# Patient Record
Sex: Female | Born: 1965 | Race: White | Hispanic: No | Marital: Married | Smoking: Never smoker
Health system: Southern US, Community
[De-identification: ages and names within clinical notes are randomized; demographics above are authoritative.]

## PROBLEM LIST (undated history)

## (undated) ENCOUNTER — Emergency Department (HOSPITAL_COMMUNITY): Admission: EM | Payer: BC Managed Care – PPO | Source: Home / Self Care

## (undated) DIAGNOSIS — D649 Anemia, unspecified: Secondary | ICD-10-CM

## (undated) DIAGNOSIS — IMO0002 Reserved for concepts with insufficient information to code with codable children: Secondary | ICD-10-CM

## (undated) DIAGNOSIS — F329 Major depressive disorder, single episode, unspecified: Secondary | ICD-10-CM

## (undated) DIAGNOSIS — W3400XA Accidental discharge from unspecified firearms or gun, initial encounter: Secondary | ICD-10-CM

## (undated) DIAGNOSIS — L97509 Non-pressure chronic ulcer of other part of unspecified foot with unspecified severity: Secondary | ICD-10-CM

## (undated) DIAGNOSIS — E669 Obesity, unspecified: Secondary | ICD-10-CM

## (undated) DIAGNOSIS — E11319 Type 2 diabetes mellitus with unspecified diabetic retinopathy without macular edema: Secondary | ICD-10-CM

## (undated) DIAGNOSIS — E11610 Type 2 diabetes mellitus with diabetic neuropathic arthropathy: Secondary | ICD-10-CM

## (undated) DIAGNOSIS — E1165 Type 2 diabetes mellitus with hyperglycemia: Secondary | ICD-10-CM

## (undated) DIAGNOSIS — E785 Hyperlipidemia, unspecified: Secondary | ICD-10-CM

## (undated) DIAGNOSIS — E1140 Type 2 diabetes mellitus with diabetic neuropathy, unspecified: Secondary | ICD-10-CM

## (undated) DIAGNOSIS — F32A Depression, unspecified: Secondary | ICD-10-CM

## (undated) DIAGNOSIS — H269 Unspecified cataract: Secondary | ICD-10-CM

## (undated) DIAGNOSIS — G8929 Other chronic pain: Secondary | ICD-10-CM

## (undated) DIAGNOSIS — M810 Age-related osteoporosis without current pathological fracture: Secondary | ICD-10-CM

## (undated) DIAGNOSIS — F419 Anxiety disorder, unspecified: Secondary | ICD-10-CM

## (undated) DIAGNOSIS — I10 Essential (primary) hypertension: Secondary | ICD-10-CM

## (undated) DIAGNOSIS — IMO0001 Reserved for inherently not codable concepts without codable children: Secondary | ICD-10-CM

## (undated) DIAGNOSIS — E118 Type 2 diabetes mellitus with unspecified complications: Secondary | ICD-10-CM

## (undated) DIAGNOSIS — J189 Pneumonia, unspecified organism: Secondary | ICD-10-CM

## (undated) HISTORY — DX: Type 2 diabetes mellitus with diabetic neuropathic arthropathy: E11.610

## (undated) HISTORY — PX: EYE SURGERY: SHX253

## (undated) HISTORY — DX: Anxiety disorder, unspecified: F41.9

## (undated) HISTORY — DX: Type 2 diabetes mellitus with diabetic neuropathy, unspecified: E11.40

## (undated) HISTORY — DX: Type 2 diabetes mellitus with hyperglycemia: E11.65

## (undated) HISTORY — DX: Non-pressure chronic ulcer of other part of unspecified foot with unspecified severity: L97.509

## (undated) HISTORY — DX: Type 2 diabetes mellitus with unspecified complications: E11.8

## (undated) HISTORY — DX: Type 2 diabetes mellitus with unspecified diabetic retinopathy without macular edema: E11.319

## (undated) HISTORY — PX: BELOW KNEE LEG AMPUTATION: SUR23

## (undated) HISTORY — DX: Hyperlipidemia, unspecified: E78.5

## (undated) HISTORY — DX: Accidental discharge from unspecified firearms or gun, initial encounter: W34.00XA

## (undated) HISTORY — PX: OTHER SURGICAL HISTORY: SHX169

## (undated) HISTORY — DX: Reserved for concepts with insufficient information to code with codable children: IMO0002

## (undated) HISTORY — PX: RETINAL DETACHMENT SURGERY: SHX105

## (undated) HISTORY — DX: Unspecified cataract: H26.9

## (undated) HISTORY — DX: Age-related osteoporosis without current pathological fracture: M81.0

## (undated) HISTORY — DX: Major depressive disorder, single episode, unspecified: F32.9

## (undated) HISTORY — DX: Other chronic pain: G89.29

## (undated) HISTORY — DX: Depression, unspecified: F32.A

## (undated) HISTORY — DX: Obesity, unspecified: E66.9

---

## 1995-08-28 HISTORY — PX: OTHER SURGICAL HISTORY: SHX169

## 1999-02-16 ENCOUNTER — Emergency Department (HOSPITAL_COMMUNITY): Admission: EM | Admit: 1999-02-16 | Discharge: 1999-02-16 | Payer: Self-pay | Admitting: Emergency Medicine

## 1999-10-04 ENCOUNTER — Encounter: Payer: Self-pay | Admitting: Emergency Medicine

## 1999-10-04 ENCOUNTER — Encounter: Admission: RE | Admit: 1999-10-04 | Discharge: 1999-10-04 | Payer: Self-pay | Admitting: Emergency Medicine

## 2000-05-25 ENCOUNTER — Ambulatory Visit (HOSPITAL_COMMUNITY): Admission: RE | Admit: 2000-05-25 | Discharge: 2000-05-25 | Payer: Self-pay | Admitting: Internal Medicine

## 2000-05-25 ENCOUNTER — Encounter: Payer: Self-pay | Admitting: Internal Medicine

## 2000-07-11 ENCOUNTER — Encounter: Admission: RE | Admit: 2000-07-11 | Discharge: 2000-07-11 | Payer: Self-pay | Admitting: Emergency Medicine

## 2000-07-11 ENCOUNTER — Encounter: Payer: Self-pay | Admitting: Emergency Medicine

## 2002-03-12 ENCOUNTER — Encounter: Payer: Self-pay | Admitting: *Deleted

## 2002-03-12 ENCOUNTER — Encounter: Admission: RE | Admit: 2002-03-12 | Discharge: 2002-03-12 | Payer: Self-pay | Admitting: *Deleted

## 2003-03-23 ENCOUNTER — Encounter: Payer: Self-pay | Admitting: General Surgery

## 2003-03-23 ENCOUNTER — Encounter: Admission: RE | Admit: 2003-03-23 | Discharge: 2003-03-23 | Payer: Self-pay | Admitting: General Surgery

## 2003-04-08 ENCOUNTER — Encounter: Payer: Self-pay | Admitting: General Surgery

## 2003-04-08 ENCOUNTER — Ambulatory Visit (HOSPITAL_COMMUNITY): Admission: RE | Admit: 2003-04-08 | Discharge: 2003-04-08 | Payer: Self-pay | Admitting: General Surgery

## 2004-10-20 ENCOUNTER — Encounter: Admission: RE | Admit: 2004-10-20 | Discharge: 2004-10-20 | Payer: Self-pay | Admitting: Family Medicine

## 2011-12-24 ENCOUNTER — Telehealth: Payer: Self-pay | Admitting: Family Medicine

## 2011-12-24 ENCOUNTER — Ambulatory Visit: Payer: Self-pay | Admitting: Emergency Medicine

## 2011-12-24 VITALS — BP 140/79 | HR 92 | Temp 98.3°F | Resp 18

## 2011-12-24 DIAGNOSIS — G589 Mononeuropathy, unspecified: Secondary | ICD-10-CM

## 2011-12-24 DIAGNOSIS — E119 Type 2 diabetes mellitus without complications: Secondary | ICD-10-CM

## 2011-12-24 DIAGNOSIS — L989 Disorder of the skin and subcutaneous tissue, unspecified: Secondary | ICD-10-CM

## 2011-12-24 DIAGNOSIS — I10 Essential (primary) hypertension: Secondary | ICD-10-CM

## 2011-12-24 DIAGNOSIS — G629 Polyneuropathy, unspecified: Secondary | ICD-10-CM

## 2011-12-24 LAB — POCT GLYCOSYLATED HEMOGLOBIN (HGB A1C): Hemoglobin A1C: 10.8

## 2011-12-24 MED ORDER — GLIPIZIDE ER 2.5 MG PO TB24: 5.0000 mg | ORAL_TABLET | Freq: Two times a day (BID) | ORAL | Status: DC

## 2011-12-24 MED ORDER — PREGABALIN 75 MG PO CAPS: 75.0000 mg | ORAL_CAPSULE | Freq: Two times a day (BID) | ORAL | Status: DC

## 2011-12-24 MED ORDER — LISINOPRIL 20 MG PO TABS: 20.0000 mg | ORAL_TABLET | Freq: Every day | ORAL | Status: DC

## 2011-12-24 MED ORDER — METFORMIN HCL 500 MG PO TABS: 1000.0000 mg | ORAL_TABLET | Freq: Two times a day (BID) | ORAL | Status: DC

## 2011-12-24 MED ORDER — SITAGLIPTIN PHOSPHATE 100 MG PO TABS: 100.0000 mg | ORAL_TABLET | Freq: Every day | ORAL | Status: DC

## 2011-12-24 NOTE — Telephone Encounter (Signed)
Received call from [facility], they state that the Lyrica and Januvia are too expensive for patient.  She would like something cheaper/$4 list, and changed back to Gabapentin.  Also, the Glipizide XL has increased her cost from $4 to $80 due to it being XL versus plain.  Can we change meds so they are more affordable?  Pls advise.

## 2011-12-24 NOTE — Progress Notes (Signed)
  Subjective:    Patient ID: Donna Kidd, female    DOB: [birth year], 46 y.o.   MRN: [id number]  HPI patient enters for recheck. She'll history diabetes mellitus which has not been under very good control. She does see her eye doctor regularly her biggest complaint at the present time his numbness of her feet and lower legs she's been on Neurontin and taking 600 mg at night and at x300 mg in the morning. She is interested in trying Lyrica to see if this would be more effective.    Review of Systems  Constitutional: Negative.   HENT: Negative.   Eyes:       She does see the eye doctor one time a year  Respiratory: Negative.   Cardiovascular: Negative.   Gastrointestinal: Negative.   Genitourinary: Negative.   Musculoskeletal: Negative.   Skin:       She has a rash area on the right side of her head and is concerned that this may be an early skin cancer.  Neurological:       She complains of a numbness of her lower extremities. This extends from the lower portion of the knees down. She is on Neurontin for this .  Hematological: Negative.   Psychiatric/Behavioral: Negative.         Objective:   Physical Exam  Constitutional: She appears well-developed and well-nourished.  HENT:  Head: Normocephalic.  Right Ear: External ear normal.  Left Ear: External ear normal.  Eyes: Pupils are equal, round, and reactive to light.  Neck: Normal range of motion. No tracheal deviation present. No thyromegaly present.  Cardiovascular: Normal rate and regular rhythm.   Pulmonary/Chest: Effort normal and breath sounds normal.  Abdominal: Soft. She exhibits no distension and no mass. There is no tenderness. There is no rebound and no guarding.  Neurological:       There is decreased sensation from the lower legs down over the feet.  Skin:       Skin exam reveals a 1 x 2 cm pigmented lesion on the right temple. She does have isolated scattered angiomas over her scalp and chest  Psychiatric: She  has a normal mood and affect.   Results for orders placed in visit on 12/24/11  GLUCOSE, POCT (MANUAL RESULT ENTRY)      Component Value Range   POC Glucose 349    POCT GLYCOSYLATED HEMOGLOBIN (HGB A1C)      Component Value Range   Hemoglobin A1C 10.8           Assessment & Plan:

## 2011-12-25 ENCOUNTER — Telehealth: Payer: Self-pay

## 2011-12-25 LAB — MICROALBUMIN, URINE: Microalb, Ur: 0.78 mg/dL (ref 0.00–1.89)

## 2011-12-25 LAB — COMPREHENSIVE METABOLIC PANEL
Alkaline Phosphatase: 92 U/L (ref 39–117)
CO2: 28 mEq/L (ref 19–32)
Creat: 0.62 mg/dL (ref 0.50–1.10)
Glucose, Bld: 411 mg/dL — ABNORMAL HIGH (ref 70–99)
Sodium: 136 mEq/L (ref 135–145)
Total Bilirubin: 0.3 mg/dL (ref 0.3–1.2)
Total Protein: 6.7 g/dL (ref 6.0–8.3)

## 2011-12-25 LAB — LIPID PANEL
Cholesterol: 274 mg/dL — ABNORMAL HIGH (ref 0–200)
Total CHOL/HDL Ratio: 6.9 Ratio
Triglycerides: 791 mg/dL — ABNORMAL HIGH (ref <150)

## 2011-12-25 NOTE — Telephone Encounter (Signed)
Okay to change her to lisinopril to take 20 mg twice a day as stated 20 once a day. The glipizide can be changed to the regular strength which is 5 mg in the morning and 5 in the evening which is not the extended release which should be cheaper.  Try lovastatin 20 mg one a day #30 refill x5 and see if she can tolerate this

## 2011-12-25 NOTE — Telephone Encounter (Signed)
Pt CB to get her lab results and when was told she needs to start on Lipitor she stated she can't afford it unless it is on the $4 list at [facility] (it is not), and that she can not take the chol med she tried several months ago bc it caused her extreme abdom cramping. According to the pts chart, she was put on pravastatin 10/12/10. Dr Cleta Alberts, do you want to change the atorvastatin to lovastatin which is the only other statin on $4 list? Also pt had several other problems w/meds 1. Pt can not afford the Lyrica Rxd, and Dr Cleta Alberts had considered inc dose of gabapentin if she couldn't get the Lyrica. Pt reqs new Rx for gabapentin, either inc or same that she has been on per Dr Ellis Parents decision. 2. Pt had been taking Glipizide 5 mg Qam and 5 mg Qpm, but new Rx was for XR 2.5 mg. She can not afford the XR - requests change back to her old Rx. 3. pts Rx for Lisinopril should have been for 20 mg Qam and 20 mg Qpm, and it was only sent in for QD. Can we please change this so that she gets enough for BID? 4. Pt can not afford the Januvia - suggested that she check w/[employer] assistance program. Pt agrees but didn't know what Dr Cleta Alberts wanted her to do at this time. Is there another DM med he can add that is not so exp other than insulin?

## 2011-12-25 NOTE — Telephone Encounter (Signed)
Pt would like [place] to [facility] w/what Dr Cleta Alberts decides/new plan at (w) until 5 pm [phone], or (c) [phone]

## 2011-12-25 NOTE — Telephone Encounter (Signed)
Please change her to glipizide 5 mg to take twice a day. Do not call in the XL which is more expensive give her the regular strength. There is no substitute for the other drug we talked about. She must be very compliant with weight loss and exercise and her diet

## 2011-12-26 MED ORDER — LISINOPRIL 20 MG PO TABS: 20.0000 mg | ORAL_TABLET | Freq: Two times a day (BID) | ORAL | Status: DC

## 2011-12-26 MED ORDER — GABAPENTIN 600 MG PO TABS: ORAL_TABLET | ORAL | Status: DC

## 2011-12-26 MED ORDER — GLIPIZIDE 5 MG PO TABS: 5.0000 mg | ORAL_TABLET | Freq: Two times a day (BID) | ORAL | Status: DC

## 2011-12-26 MED ORDER — LOVASTATIN 20 MG PO TABS: 20.0000 mg | ORAL_TABLET | Freq: Every day | ORAL | Status: DC

## 2011-12-26 NOTE — Telephone Encounter (Signed)
Understood.  Can we change the Lyrica back to Gabapentin?

## 2011-12-26 NOTE — Telephone Encounter (Signed)
Patient notified and rx's changed and sent to pharmacy.

## 2011-12-27 NOTE — Telephone Encounter (Signed)
Already done

## 2011-12-27 NOTE — Telephone Encounter (Signed)
I believe this has already been done. She is to be on gabapentin 600 mg she takes a half tablet in the morning and one tablet at night. She can have a prescription for #45 with refills for one year.

## 2012-02-26 ENCOUNTER — Encounter: Payer: Self-pay | Admitting: Family Medicine

## 2012-02-26 ENCOUNTER — Ambulatory Visit: Payer: Self-pay | Admitting: Family Medicine

## 2012-02-26 VITALS — BP 130/84 | HR 84 | Temp 99.7°F | Resp 16 | Ht 67.5 in | Wt 241.2 lb

## 2012-02-26 DIAGNOSIS — IMO0001 Reserved for inherently not codable concepts without codable children: Secondary | ICD-10-CM

## 2012-02-26 DIAGNOSIS — E1165 Type 2 diabetes mellitus with hyperglycemia: Secondary | ICD-10-CM

## 2012-02-26 DIAGNOSIS — IMO0002 Reserved for concepts with insufficient information to code with codable children: Secondary | ICD-10-CM

## 2012-02-26 DIAGNOSIS — L600 Ingrowing nail: Secondary | ICD-10-CM

## 2012-02-26 LAB — GLUCOSE, POCT (MANUAL RESULT ENTRY): POC Glucose: 172 mg/dl — AB (ref 70–99)

## 2012-02-26 MED ORDER — DOXYCYCLINE HYCLATE 100 MG PO TABS: 100.0000 mg | ORAL_TABLET | Freq: Two times a day (BID) | ORAL | Status: AC

## 2012-02-26 MED ORDER — GLIPIZIDE 10 MG PO TABS: ORAL_TABLET | ORAL | Status: DC

## 2012-02-26 NOTE — Progress Notes (Signed)
  Subjective:    Patient ID: Donna Kidd, female    DOB: [birth year], 46 y.o.   MRN: [id number]  HPI Donna Kidd is a 46 y.o. female Hx of HTN, DM2 - uncontrolled at 12/24/10 OV - A1C 10.8, changed meds - see phone notes. Unable to afford Januvia.  On Mett. No recent missed doses.  blood sugars in low 200's, lowest 187-190.  Highest past few weeks - 289.  Has changed eating habits since last office visit.    Largest meal - breakfast.  Has not taken meds or eaten yet today.    R great toe- swelling/redness started 2 -3 weeks ago.  Now redness spreading back in past 4-5 days.  No fever.  Attempted tx: peroxide, epsom salts..neosporin, polysporin, elevation.  Review of Systems Per HPI. No f/c.  No drainage from toe.       Objective:   Physical Exam  Constitutional: She is oriented to person, place, and time. She appears well-developed and well-nourished.  Pulmonary/Chest: Effort normal.  Neurological: She is alert and oriented to person, place, and time.  Skin: Skin is warm.       Filed Vitals:   02/26/12 0922  BP: 130/84  Pulse: 84  Temp: 99.7 F (37.6 C)  TempSrc: Oral  Resp: 16  Height: 5' 7.5" (1.715 m)  Weight: 241 lb 3.2 oz (109.408 kg)  SpO2: 99%   Results for orders placed in visit on 02/26/12  GLUCOSE, POCT (MANUAL RESULT ENTRY)      Component Value Range   POC Glucose 172 (*) 70 - 99 mg/dl       Assessment & Plan:  Donna Kidd is a 46 y.o. female 1. DM (diabetes mellitus), type 2, uncontrolled  POCT glucose (manual entry)  2. Paronychia    3. Ingrown right greater toenail     Likely ingrown toenail as primary problem, with secondary early paronychia with recent worsening. Wound healing likely affected by underlying uncontrolled DM. Nail/foot care discussed - specifically not cutting nails short or just cutting one side.  Understanding expressed. Wedge excision per procedure note, start doxycycline 100mg  BID, and rtc/er precautions discussed.   DM-  uncontrolled. Increase glipizide to 10mg  in am with breakfast, 1/2 Qpm with dinner.  Plan on DM follow up in next 4-6 weeks. Hypoglycemic precautions.

## 2012-02-26 NOTE — Patient Instructions (Addendum)
Start doxycycline - twice per day for infection of toe.  Avoid cutting toenails as close.   Change glipizide to 10mg  in the morning and 5mg  (1/2 pill) at night.  Keep a record of your blood sugars and follow up in next 4-6 weeks to discuss diabetes.  Return to the clinic or go to the nearest emergency room if any of your symptoms worsen or new symptoms occur.   Paronychia Paronychia is an inflammatory reaction involving the folds of the skin surrounding the fingernail. This is commonly caused by an infection in the skin around a nail. The most common cause of paronychia is frequent wetting of the hands (as seen with bartenders, food servers, nurses or others who wet their hands). This makes the skin around the fingernail susceptible to infection by bacteria (germs) or fungus. Other predisposing factors are:  Aggressive manicuring.   Nail biting.   Thumb sucking.  The most common cause is a staphylococcal (a type of germ) infection, or a fungal (Candida) infection. When caused by a germ, it usually comes on suddenly with redness, swelling, pus and is often painful. It may get under the nail and form an abscess (collection of pus), or form an abscess around the nail. If the nail itself is infected with a fungus, the treatment is usually prolonged and may require oral medicine for up to one year. Your caregiver will determine the length of time treatment is required. The paronychia caused by bacteria (germs) may largely be avoided by not pulling on hangnails or picking at cuticles. When the infection occurs at the tips of the finger it is called felon. When the cause of paronychia is from the herpes simplex virus (HSV) it is called herpetic whitlow. TREATMENT  When an abscess is present treatment is often incision and drainage. This means that the abscess must be cut open so the pus can get out. When this is done, the following home care instructions should be followed. HOME CARE INSTRUCTIONS   It is  important to keep the affected fingers very dry. Rubber or plastic gloves over cotton gloves should be used whenever the hand must be placed in water.   Keep wound clean, dry and dressed as suggested by your caregiver between warm soaks or warm compresses.   Soak in warm water for fifteen to twenty minutes three to four times per day for bacterial infections. Fungal infections are very difficult to treat, so often require treatment for long periods of time.   For bacterial (germ) infections take antibiotics (medicine which kill germs) as directed and finish the prescription, even if the problem appears to be solved before the medicine is gone.   Only take over-the-counter or prescription medicines for pain, discomfort, or fever as directed by your caregiver.  SEEK IMMEDIATE MEDICAL CARE IF:  You have redness, swelling, or increasing pain in the wound.   You notice pus coming from the wound.   You have a fever.   You notice a bad smell coming from the wound or dressing.  Document Released: 02/06/2001 Document Revised: 08/02/2011 Document Reviewed: 10/08/2008 [facility] Patient Information 2012 [place], [place].

## 2012-02-26 NOTE — Progress Notes (Signed)
   Patient ID: LIYANA SUNIGA MRN: [id number], DOB: [birth year], 46 y.o. Date of Encounter: 02/26/2012, 12:19 PM   PROCEDURE NOTE: Verbal consent obtained. Sterile technique employed. Numbing: Anesthesia obtained with ratio 2% plain lidocaine in a digital block Betadine prep per usual protocol.  Medial nail lifted without difficulty and wedge resection performed. Proximal aspect of nail bed explored revealing no nail remnants. Ingrown tissue debrided and irrigated. Xeroform dressing applied. Wound care instructions including precautions covered with patient. Handout given.   Grier Mitts, PA-C 02/26/2012 12:19 PM

## 2012-03-02 ENCOUNTER — Telehealth: Payer: Self-pay

## 2012-03-02 NOTE — Telephone Encounter (Signed)
PT STATES THAT HER TOE

## 2012-03-02 NOTE — Telephone Encounter (Signed)
Pt states that her toe is red and looks like pus underneath toe.  Advised pt to RTC.  Pt will RTC on Monday 03/03/12

## 2012-03-02 NOTE — Telephone Encounter (Signed)
Pt. Called stated seen on Monday with ingrown toe nail which was cut out. Pt. Says now looks infected. Please call pt back to let her know if she  Needs to come back in. Pt. Stated area code needs to be dialed due to  Almost at [place]. Line. [phone]

## 2012-03-03 ENCOUNTER — Ambulatory Visit (INDEPENDENT_AMBULATORY_CARE_PROVIDER_SITE_OTHER): Payer: Self-pay | Admitting: Physician Assistant

## 2012-03-03 VITALS — BP 128/82 | HR 86 | Temp 98.4°F | Resp 14 | Ht 67.5 in | Wt 243.0 lb

## 2012-03-03 DIAGNOSIS — E119 Type 2 diabetes mellitus without complications: Secondary | ICD-10-CM

## 2012-03-03 DIAGNOSIS — L03031 Cellulitis of right toe: Secondary | ICD-10-CM

## 2012-03-03 DIAGNOSIS — L03039 Cellulitis of unspecified toe: Secondary | ICD-10-CM

## 2012-03-03 DIAGNOSIS — E1140 Type 2 diabetes mellitus with diabetic neuropathy, unspecified: Secondary | ICD-10-CM | POA: Insufficient documentation

## 2012-03-03 NOTE — Progress Notes (Signed)
  Subjective:    Patient ID: Donna Kidd, female    DOB: Oct 20, 1965, 46 y.o.   MRN: [id number]  HPI  Pt presents to clinic for recheck R great toe.  Pt had a nail wedge resection last week and is on abx but worried that her toe is still infected.  The nail is white and the surrounding skin is red.  She has not have a lot of pain because of her neuropathy.  She is taking her abx without problems.    Review of Systems     Objective:   Physical Exam  Constitutional: She is oriented to person, place, and time. She appears well-developed and well-nourished.  HENT:  Head: Normocephalic and atraumatic.  Right Ear: External ear normal.  Left Ear: External ear normal.  Neck: Normal range of motion.  Musculoskeletal: She exhibits no tenderness.       Feet:  Neurological: She is alert and oriented to person, place, and time.  Skin: Skin is warm and dry.  Psychiatric: She has a normal mood and affect. Her behavior is normal. Judgment and thought content normal.          Assessment & Plan:   1. DM (diabetes mellitus)   2. Paronychia of great toe, right   Pt to continue her Doxy.  She can continue her soaks, she should keep it covered with band-aid to prevent more infection.  If worsening can refill her abx after she finishes this current course. Answered questions for patient.

## 2012-09-28 ENCOUNTER — Emergency Department (HOSPITAL_COMMUNITY)
Admission: EM | Admit: 2012-09-28 | Discharge: 2012-09-28 | Disposition: A | Discharge status: Home / Self Care | Payer: BC Managed Care – PPO | Source: Home / Self Care | Attending: Emergency Medicine | Admitting: Emergency Medicine

## 2012-09-28 ENCOUNTER — Encounter (HOSPITAL_COMMUNITY): Payer: Self-pay | Admitting: Emergency Medicine

## 2012-09-28 DIAGNOSIS — J40 Bronchitis, not specified as acute or chronic: Secondary | ICD-10-CM

## 2012-09-28 HISTORY — DX: Essential (primary) hypertension: I10

## 2012-09-28 MED ORDER — GUAIFENESIN-CODEINE 100-10 MG/5ML PO SYRP: 5.0000 mL | ORAL_SOLUTION | Freq: Three times a day (TID) | ORAL | Status: AC | PRN

## 2012-09-28 MED ORDER — AMOXICILLIN-POT CLAVULANATE 500-125 MG PO TABS: 1.0000 | ORAL_TABLET | Freq: Three times a day (TID) | ORAL | Status: AC

## 2012-09-28 NOTE — ED Notes (Signed)
Pt c/o productive cough x 1 month with green sputum. Chest tightness and sob. Pt has hx of bronchitis. Pt has tried otc meds with no relief.

## 2012-09-28 NOTE — ED Provider Notes (Signed)
History     CSN: [id number]  Arrival date & time 09/28/12  1208   First MD Initiated Contact with Patient 09/28/12 1316      Chief Complaint  Patient presents with  . Bronchitis    cough x 1 month. green sputum. sob chest tightness    (Consider location/radiation/quality/duration/timing/severity/associated sxs/prior treatment) HPI Comments: Patient presents urgent care this afternoon complaining that she's been coughing for about a month within the last 2 weeks now she's experiencing a yellow to green sputum. Been having some tightness and shortness of breath. " I have bronchitis, I usually get this every year last episode was in August and in turn into pneumonia". Have been taking over-the-counter Counter medicnes for cough   Robitussin and Delsym. No nausea vomiting abdominal pain or diarrheas. " My sinuses are draining and have been having left ear pain on and off for the last few weeks", I have been treated in the past with a shot, Augmentin and a cough syrup that seems to help.  Patient is a 47 y.o. female presenting with cough.  Cough This is a new problem. The current episode started more than 1 week ago. The problem occurs constantly. The problem has been gradually worsening. The cough is productive of sputum. There has been no fever. Associated symptoms include chills, shortness of breath and wheezing. Pertinent negatives include no headaches and no rhinorrhea. She is not a smoker. Her past medical history is significant for pneumonia. Her past medical history does not include emphysema or asthma.    Past Medical History  Diagnosis Date  . Diabetes mellitus without complication   . Hypertension     History reviewed. No pertinent past surgical history.  History reviewed. No pertinent family history.  History  Substance Use Topics  . Smoking status: Current Every Day Smoker -- 1.0 packs/day    Types: Cigarettes  . Smokeless tobacco: Not on file  . Alcohol Use: No    OB  History    Grav Para Term Preterm Abortions TAB SAB Ect Mult Living                  Review of Systems  Constitutional: Positive for chills and activity change. Negative for fever, fatigue and unexpected weight change.  HENT: Negative for rhinorrhea and neck pain.   Respiratory: Positive for cough, shortness of breath and wheezing. Negative for apnea, choking and chest tightness.   Cardiovascular: Negative for palpitations.  Gastrointestinal: Negative for abdominal pain.  Musculoskeletal: Negative for back pain.  Neurological: Negative for dizziness, facial asymmetry, weakness and headaches.    Allergies  Bactrim; Clarithromycin; and Flexeril  Home Medications   Current Outpatient Rx  Name  Route  Sig  Dispense  Refill  . DIAMOX SEQUELS PO   Oral   Take by mouth.         Marland Kitchen LISINOPRIL 20 MG PO TABS   Oral   Take 1 tablet (20 mg total) by mouth 2 (two) times daily.   60 tablet   11   . LOVASTATIN 20 MG PO TABS   Oral   Take 1 tablet (20 mg total) by mouth at bedtime.   30 tablet   5   . METFORMIN HCL 500 MG PO TABS   Oral   Take 2 tablets (1,000 mg total) by mouth 2 (two) times daily with a meal.   60 tablet   11   . AMOXICILLIN-POT CLAVULANATE 500-125 MG PO TABS   Oral  Take 1 tablet (500 mg total) by mouth 3 (three) times daily.   20 tablet   0   . CINNAMON PO   Oral   Take by mouth 2 (two) times daily.         Marland Kitchen GABAPENTIN 600 MG PO TABS      Take 1/2 tablet in am, and 1 tablet at bedtime   45 tablet   11   . GLIPIZIDE 10 MG PO TABS      Take 1 tablet by mouth before breakfast, take 1/2 tablet by mouth before dinner.   60 tablet   3   . GUAIFENESIN-CODEINE 100-10 MG/5ML PO SYRP   Oral   Take 5 mLs by mouth 3 (three) times daily as needed for cough.   120 mL   0   . SITAGLIPTIN PHOSPHATE 100 MG PO TABS   Oral   Take 1 tablet (100 mg total) by mouth daily.   30 tablet   11   . VITAMIN E 100 UNITS PO CAPS   Oral   Take 100 Units by  mouth 2 (two) times daily.           BP 152/91  Pulse 104  Temp 98.4 F (36.9 C) (Oral)  Resp 19  SpO2 95%  LMP 09/24/2012  Physical Exam  Nursing note and vitals reviewed. Constitutional: She is oriented to person, place, and time. Vital signs are normal. She appears well-developed and well-nourished.  Non-toxic appearance. She does not have a sickly appearance. She does not appear ill. No distress.  HENT:  Head: Normocephalic.  Right Ear: Hearing, tympanic membrane, external ear and ear canal normal.  Left Ear: Hearing, tympanic membrane, external ear and ear canal normal.  Mouth/Throat: Uvula is midline and oropharynx is clear and moist. No oropharyngeal exudate, posterior oropharyngeal edema, posterior oropharyngeal erythema or tonsillar abscesses.  Eyes: Conjunctivae normal are normal. Right eye exhibits no discharge. Left eye exhibits no discharge. No scleral icterus.  Neck: Neck supple. No JVD present.  Cardiovascular: Normal rate and regular rhythm.  Exam reveals no gallop and no friction rub.   No murmur heard. Pulmonary/Chest: Effort normal and breath sounds normal. No respiratory distress. She has no decreased breath sounds. She has no wheezes. She has no rhonchi. She has no rales. She exhibits no tenderness.  Lymphadenopathy:    She has no cervical adenopathy.  Neurological: She is alert and oriented to person, place, and time.  Skin: No erythema.    ED Course  Procedures (including critical care time)  Labs Reviewed - No data to display No results found.   1. Bronchitis       MDM  Patient symptomatic with respiratory symptoms for 4 weeks. Comfortable afebrile in no respiratory distress with an unremarkable on exam. Patient has been symptomatic for more than 2 weeks with now a worsening productive cough. Will treat patient with a broad-spectrum antibiotic along with an antitussive syrup. Patient was instructed to followup with her primary care Dr. if no  improvement is noted after 5 or 7 days of treatment. Instructed to return if worsening symptoms or new symptoms patient agrees with treatment plan followup care.        Jimmie Molly, MD 09/28/12 1331

## 2012-12-12 ENCOUNTER — Ambulatory Visit (INDEPENDENT_AMBULATORY_CARE_PROVIDER_SITE_OTHER): Payer: BC Managed Care – PPO | Admitting: Family Medicine

## 2012-12-12 ENCOUNTER — Encounter: Payer: Self-pay | Admitting: Family Medicine

## 2012-12-12 VITALS — BP 160/92 | HR 83 | Temp 98.2°F | Resp 16 | Ht 68.5 in | Wt 257.0 lb

## 2012-12-12 DIAGNOSIS — IMO0001 Reserved for inherently not codable concepts without codable children: Secondary | ICD-10-CM

## 2012-12-12 DIAGNOSIS — E1149 Type 2 diabetes mellitus with other diabetic neurological complication: Secondary | ICD-10-CM

## 2012-12-12 DIAGNOSIS — R5381 Other malaise: Secondary | ICD-10-CM

## 2012-12-12 DIAGNOSIS — E119 Type 2 diabetes mellitus without complications: Secondary | ICD-10-CM

## 2012-12-12 DIAGNOSIS — I10 Essential (primary) hypertension: Secondary | ICD-10-CM | POA: Insufficient documentation

## 2012-12-12 DIAGNOSIS — E1140 Type 2 diabetes mellitus with diabetic neuropathy, unspecified: Secondary | ICD-10-CM

## 2012-12-12 DIAGNOSIS — Z79899 Other long term (current) drug therapy: Secondary | ICD-10-CM

## 2012-12-12 DIAGNOSIS — R5383 Other fatigue: Secondary | ICD-10-CM

## 2012-12-12 LAB — CBC WITH DIFFERENTIAL/PLATELET
Basophils Absolute: 0.1 10*3/uL (ref 0.0–0.1)
Basophils Relative: 1 % (ref 0–1)
Eosinophils Absolute: 0.3 10*3/uL (ref 0.0–0.7)
Eosinophils Relative: 3 % (ref 0–5)
HCT: 37 % (ref 36.0–46.0)
Lymphocytes Relative: 35 % (ref 12–46)
MCH: 28.3 pg (ref 26.0–34.0)
MCHC: 33.5 g/dL (ref 30.0–36.0)
MCV: 84.5 fL (ref 78.0–100.0)
Monocytes Absolute: 0.6 10*3/uL (ref 0.1–1.0)
Platelets: 352 10*3/uL (ref 150–400)
RDW: 14.2 % (ref 11.5–15.5)
WBC: 10.6 10*3/uL — ABNORMAL HIGH (ref 4.0–10.5)

## 2012-12-12 LAB — COMPREHENSIVE METABOLIC PANEL
ALT: 13 U/L (ref 0–35)
Albumin: 4.3 g/dL (ref 3.5–5.2)
Alkaline Phosphatase: 88 U/L (ref 39–117)
Glucose, Bld: 127 mg/dL — ABNORMAL HIGH (ref 70–99)
Potassium: 4.5 mEq/L (ref 3.5–5.3)
Sodium: 136 mEq/L (ref 135–145)
Total Bilirubin: 0.3 mg/dL (ref 0.3–1.2)
Total Protein: 7.1 g/dL (ref 6.0–8.3)

## 2012-12-12 MED ORDER — INSULIN PEN NEEDLE 32G X 6 MM MISC: 1.0000 | Freq: Two times a day (BID) | Status: DC

## 2012-12-12 MED ORDER — PREGABALIN 100 MG PO CAPS: 100.0000 mg | ORAL_CAPSULE | Freq: Two times a day (BID) | ORAL | Status: DC

## 2012-12-12 MED ORDER — INSULIN GLARGINE 100 UNIT/ML ~~LOC~~ SOLN: 10.0000 [IU] | Freq: Every day | SUBCUTANEOUS | Status: DC

## 2012-12-12 MED ORDER — LIRAGLUTIDE 18 MG/3ML ~~LOC~~ SOLN: 0.6000 mg | Freq: Every day | SUBCUTANEOUS | Status: DC

## 2012-12-12 MED ORDER — METFORMIN HCL 1000 MG PO TABS: 1000.0000 mg | ORAL_TABLET | Freq: Two times a day (BID) | ORAL | Status: DC

## 2012-12-12 MED ORDER — LOSARTAN POTASSIUM-HCTZ 100-25 MG PO TABS: 1.0000 | ORAL_TABLET | Freq: Every day | ORAL | Status: DC

## 2012-12-12 NOTE — Patient Instructions (Addendum)
Diabetes and Foot Care Diabetes may cause you to have a poor blood supply (circulation) to your legs and feet. Because of this, the skin may be thinner, break easier, and heal more slowly. You also may have nerve damage in your legs and feet causing decreased feeling. You may not notice minor injuries to your feet that could lead to serious problems or infections. Taking care of your feet is one of the most important things you can do for yourself.  HOME CARE INSTRUCTIONS  Do not go barefoot. Bare feet are easily injured.  Check your feet daily for blisters, cuts, and redness.  Wash your feet with warm water (not hot) and mild soap. Pat your feet and between your toes until completely dry.  Apply a moisturizing lotion that does not contain alcohol or petroleum jelly to the dry skin on your feet and to dry brittle toenails. Do not put it between your toes.  Trim your toenails straight across. Do not dig under them or around the cuticle.  Do not cut corns or calluses, or try to remove them with medicine.  Wear clean cotton socks or stockings every day. Make sure they are not too tight. Do not wear knee high stockings since they may decrease blood flow to your legs.  Wear leather shoes that fit properly and have enough cushioning. To break in new shoes, wear them just a few hours a day to avoid injuring your feet.  Wear shoes at all times, even in the house.  Do not cross your legs. This may decrease the blood flow to your feet.  If you find a minor scrape, cut, or break in the skin on your feet, keep it and the skin around it clean and dry. These areas may be cleansed with mild soap and water. Do not use peroxide, alcohol, iodine or Merthiolate.  When you remove an adhesive bandage, be sure not to harm the skin around it.  If you have a wound, look at it several times a day to make sure it is healing.  Do not use heating pads or hot water bottles. Burns can occur. If you have lost feeling  in your feet or legs, you may not know it is happening until it is too late.  Report any cuts, sores or bruises to your caregiver. Do not wait! SEEK MEDICAL CARE IF:   You have an injury that is not healing or you notice redness, numbness, burning, or tingling.  Your feet always feel cold.  You have pain or cramps in your legs and feet. SEEK IMMEDIATE MEDICAL CARE IF:   There is increasing redness, swelling, or increasing pain in the wound.  There is a red line that goes up your leg.  Pus is coming from a wound.  You develop an unexplained oral temperature above 102 F (38.9 C), or as your caregiver suggests.  You notice a bad smell coming from an ulcer or wound. MAKE SURE YOU:   Understand these instructions.  Will watch your condition.  Will get help right away if you are not doing well or get worse. Document Released: 08/10/2000 Document Revised: 11/05/2011 Document Reviewed: 02/16/2009 [facility] Patient Information 2013 [place], [place]. Diabetes and Exercise Regular exercise is important and can help:   Control blood glucose (sugar).  Decrease blood pressure.    Control blood lipids (cholesterol, triglycerides).  Improve overall health. BENEFITS FROM EXERCISE  Improved fitness.  Improved flexibility.  Improved endurance.  Increased bone density.  Weight control.  Increased muscle strength.  Decreased body fat.  Improvement of the body's use of insulin, a hormone.  Increased insulin sensitivity.  Reduction of insulin needs.  Reduced stress and tension.  Helps you feel better. People with diabetes who add exercise to their lifestyle gain additional benefits, including:  Weight loss.  Reduced appetite.  Improvement of the body's use of blood glucose.  Decreased risk factors for heart disease:  Lowering of cholesterol and triglycerides.  Raising the level of good cholesterol (high-density lipoproteins, HDL).  Lowering blood  sugar.  Decreased blood pressure. TYPE 2 DIABETES AND EXERCISE  Regular physical activity can help control blood glucose.  Exercise is important because it may:  Increase the body's sensitivity to insulin.  Improve blood glucose control.  Exercise reduces the risk of heart disease. It decreases serum cholesterol and triglycerides. It also lowers blood pressure.  Those who take insulin or oral hypoglycemic agents should watch for signs of hypoglycemia. These signs include dizziness, shaking, sweating, chills, and confusion.  Body water is lost during exercise. It must be replaced. This will help to avoid loss of body fluids (dehydration) or heat stroke. Be sure to talk to your caregiver before starting an exercise program to make sure it is safe for you. Remember, any activity is better than none.  Document Released: 11/03/2003 Document Revised: 11/05/2011 Document Reviewed: 02/17/2009 [facility] Patient Information 2013 [place], [place]. Diabetes and Standards of Medical Care  Diabetes is complicated. You may find that your diabetes team includes a dietitian, nurse, diabetes educator, eye doctor, and more. To help everyone know what is going on and to help you get the care you deserve, the following schedule of care was developed to help keep you on track. Below are the tests, exams, vaccines, medicines, education, and plans you will need. A1c test  Performed at least 2 times a year if you are meeting treatment goals.  Performed 4 times a year if therapy has changed or if you are not meeting therapy/glycemic goals. Aspirin medicine  Take daily as directed by your caregiver. Blood pressure test  Performed at every routine medical visit. The goal is less than 130/80 mm/Hg. Dental exam  Get a dental exam at least 2 times a year. Dilated eye exam (retinal exam)  Type 1 diabetes: Get an exam within 5 years of diagnosis and then yearly.  Type 2 diabetes: Get an exam at diagnosis and  then yearly. All exams thereafter can be extended to every 2 to 3 years if one or more exams have been normal. Foot care exam  Visual foot exams are performed at every routine medical visit. The exams check for cuts, injuries, or other problems with the feet.  A comprehensive foot exam should be done yearly. This includes visual inspection as well as assessing foot pulses and testing for loss of sensation. Kidney function test (urine microalbumin)  Performed once a year.  Type 1 diabetes: The first test is performed 5 years after diagnosis.  Type 2 diabetes: The first test is performed at the time of diagnosis.  A serum creatinine and estimated glomerular filtration rate (eGFR) test is done once a year to tell the level of chronic kidney disease (CKD), if present. Lipid profile (Cholesterol, HDL, LDL, Triglycerides)  Performed once a year for most people. If at low risk, may be assessed every 2 years.  The goal for LDL is less than 100 mg/dl. If at high risk, the goal is less than 70 mg/dl.  The goal for  HDL is higher than 40 mg/dl for men and higher than 50 mg/dl for women.  The goal for triglycerides is less than 150 mg/dl. Flu vaccine, pneumonia vaccine, and hepatitis B vaccine  The flu vaccine is recommended yearly.  The pneumonia vaccine is generally given once in a lifetime. However, there are some instances where another vaccine is recommended. Check with your caregiver.  The hepatitis B vaccine is also recommended for adults with diabetes. Diabetes self-management education  Recommended at diagnosis and ongoing as needed. Treatment plan  Reviewed at every medical visit. Document Released: 06/10/2009 Document Revised: 11/05/2011 Document Reviewed: 02/13/2011 [facility] Patient Information 2013 [place], [place].
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
## 2012-12-13 LAB — MICROALBUMIN, URINE: Microalb, Ur: 4.13 mg/dL — ABNORMAL HIGH (ref 0.00–1.89)

## 2012-12-15 ENCOUNTER — Encounter: Payer: Self-pay | Admitting: Family Medicine

## 2012-12-15 ENCOUNTER — Telehealth: Payer: Self-pay | Admitting: Radiology

## 2012-12-15 NOTE — Telephone Encounter (Signed)
Pt called to verify if pens were sent into pharmacy along with her other medications. I advised her that they were and to check with the pharmacy in regards as to why they were not ready along with her other meds. She will contact her pharmacy and have them give [place] a call if there is any issues. I told her to please contact us back if there was any questions. Her pharmacy called [place] and stated they did not fill it due to gauge size. I spoke with Frances Furbish, PA-C and she ok'd for the pharmacy to change it to a 32/4. Pharmacy will change and have filled for patient.

## 2012-12-19 ENCOUNTER — Telehealth: Payer: Self-pay

## 2012-12-19 NOTE — Telephone Encounter (Signed)
Patient was seen by dr Clelia Croft recently and we changed her prescription her blood sugar is very high and would like someone to call her ASAP [phone] or [phone]

## 2012-12-19 NOTE — Telephone Encounter (Signed)
Patient states her recent CBG's have been elevated (320-340) Today it was 477. She feels weak and has been sweating a lot. Her BP was 158/105 last night- she felt it was running high lately.   Can she use a sliding scale when sugars are high. She just ordered controls on her machine- she is waiting for the controls to be sent to her- it has never done.   She does not have the time to come to the office today. She is feeling fine today. Advised that is she started feeling worse she needed to get into the office or the ER.

## 2012-12-20 NOTE — Telephone Encounter (Signed)
She is on Lantus only so cannot use a sliding scale on this - can only give herself lantus once a day.  Increase lantus from 10u qhs to 20u qhs tonight.  If her cbgs are still >200 tomorrow, increase lantus to 30u qhs and call with update.

## 2012-12-20 NOTE — Telephone Encounter (Signed)
Patient notified and voiced understanding.

## 2012-12-24 ENCOUNTER — Encounter: Payer: Self-pay | Admitting: Family Medicine

## 2013-01-14 ENCOUNTER — Encounter: Payer: Self-pay | Admitting: Family Medicine

## 2013-01-14 NOTE — Telephone Encounter (Signed)
Please review, her doses are different from previous. You wanted her to increase. I pended the Lantus and Victoza and I am unsure about the Lisinopril please advise and respond appropriately thanks Brittane Grudzinski

## 2013-01-16 ENCOUNTER — Other Ambulatory Visit: Payer: Self-pay | Admitting: Family Medicine

## 2013-01-16 ENCOUNTER — Other Ambulatory Visit: Payer: Self-pay | Admitting: Radiology

## 2013-01-16 DIAGNOSIS — E1140 Type 2 diabetes mellitus with diabetic neuropathy, unspecified: Secondary | ICD-10-CM

## 2013-01-16 DIAGNOSIS — I10 Essential (primary) hypertension: Secondary | ICD-10-CM

## 2013-01-16 DIAGNOSIS — E119 Type 2 diabetes mellitus without complications: Secondary | ICD-10-CM

## 2013-01-16 MED ORDER — PREGABALIN 75 MG PO CAPS: 75.0000 mg | ORAL_CAPSULE | Freq: Every day | ORAL | Status: DC

## 2013-01-16 MED ORDER — ENALAPRIL-HYDROCHLOROTHIAZIDE 10-25 MG PO TABS: 1.0000 | ORAL_TABLET | Freq: Every day | ORAL | Status: DC

## 2013-01-16 MED ORDER — LIRAGLUTIDE 18 MG/3ML ~~LOC~~ SOPN: 1.2000 mg | PEN_INJECTOR | Freq: Every day | SUBCUTANEOUS | Status: DC

## 2013-01-16 MED ORDER — LIRAGLUTIDE 18 MG/3ML ~~LOC~~ SOPN: 1.2000 mL | PEN_INJECTOR | Freq: Every day | SUBCUTANEOUS | Status: DC

## 2013-01-16 MED ORDER — INSULIN GLARGINE 100 UNIT/ML ~~LOC~~ SOLN: 50.0000 [IU] | Freq: Every day | SUBCUTANEOUS | Status: DC

## 2013-01-16 NOTE — Telephone Encounter (Signed)
Discussed w/ pt, she is doing much better.  She is taking 1.2 Victoza and 50u of lantus qhs so please refill these at these doses for a 3 mo supply. Her a.m. cbgs are running 105-160s and during the day she is running 120s-170s.  She is checking her cbgs 3-4x/d. The losartan made her burp but she liked the fluid pill portion of it. However, she had to switch back to the lisinopril as coughing is better than burping but lisinopril didn't have the hctz in it.  Therefore, we will do trial of enalapril/hctz 10/25 to help the swelling in her feet. Reports her BP as been 110-130s/80s. She is only taking lyrica 100mg  qhs - has to take it around 7 p.m. and still feeling groggy in the morning so we will decrease the dose to 75mg  qhs

## 2013-01-16 NOTE — Telephone Encounter (Signed)
Pharmacy called regarding dose on victoza, corrected and resubmitted.

## 2013-02-12 ENCOUNTER — Other Ambulatory Visit: Payer: Self-pay | Admitting: Physician Assistant

## 2013-02-12 MED ORDER — LISINOPRIL 20 MG PO TABS: 20.0000 mg | ORAL_TABLET | Freq: Two times a day (BID) | ORAL | Status: DC

## 2013-04-06 DIAGNOSIS — Z0271 Encounter for disability determination: Secondary | ICD-10-CM

## 2013-04-27 ENCOUNTER — Ambulatory Visit (INDEPENDENT_AMBULATORY_CARE_PROVIDER_SITE_OTHER): Payer: BC Managed Care – PPO | Admitting: Family Medicine

## 2013-04-27 ENCOUNTER — Encounter: Payer: Self-pay | Admitting: Family Medicine

## 2013-04-27 VITALS — BP 124/72 | HR 82 | Temp 99.0°F | Resp 16 | Ht 67.5 in | Wt 251.2 lb

## 2013-04-27 DIAGNOSIS — E1140 Type 2 diabetes mellitus with diabetic neuropathy, unspecified: Secondary | ICD-10-CM

## 2013-04-27 DIAGNOSIS — E1149 Type 2 diabetes mellitus with other diabetic neurological complication: Secondary | ICD-10-CM

## 2013-04-27 DIAGNOSIS — E119 Type 2 diabetes mellitus without complications: Secondary | ICD-10-CM

## 2013-04-27 DIAGNOSIS — Z79899 Other long term (current) drug therapy: Secondary | ICD-10-CM

## 2013-04-27 DIAGNOSIS — I10 Essential (primary) hypertension: Secondary | ICD-10-CM

## 2013-04-27 DIAGNOSIS — E1142 Type 2 diabetes mellitus with diabetic polyneuropathy: Secondary | ICD-10-CM

## 2013-04-27 DIAGNOSIS — E785 Hyperlipidemia, unspecified: Secondary | ICD-10-CM

## 2013-04-27 DIAGNOSIS — K219 Gastro-esophageal reflux disease without esophagitis: Secondary | ICD-10-CM

## 2013-04-27 NOTE — Patient Instructions (Signed)
Your hemoglobin a1c has improved from 10.8 -> 9.8 -> 8.2.  We would like it to be between 6.5 - 7.5 - the lower the better - and you are almost there!!!!  We just need to find a few more things we can tweak.
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
## 2013-04-28 LAB — COMPREHENSIVE METABOLIC PANEL
AST: 10 U/L (ref 0–37)
BUN: 14 mg/dL (ref 6–23)
Calcium: 9.8 mg/dL (ref 8.4–10.5)
Chloride: 99 mEq/L (ref 96–112)
Creat: 0.71 mg/dL (ref 0.50–1.10)
Total Bilirubin: 0.2 mg/dL — ABNORMAL LOW (ref 0.3–1.2)

## 2013-04-29 ENCOUNTER — Encounter: Payer: Self-pay | Admitting: Family Medicine

## 2013-04-29 DIAGNOSIS — E785 Hyperlipidemia, unspecified: Secondary | ICD-10-CM | POA: Insufficient documentation

## 2013-04-29 MED ORDER — OMEPRAZOLE 40 MG PO CPDR: 40.0000 mg | DELAYED_RELEASE_CAPSULE | Freq: Every day | ORAL | Status: DC

## 2013-05-06 ENCOUNTER — Other Ambulatory Visit (HOSPITAL_COMMUNITY)
Admission: RE | Admit: 2013-05-06 | Discharge: 2013-05-06 | Disposition: A | Discharge status: Home / Self Care | Payer: BC Managed Care – PPO | Source: Ambulatory Visit | Attending: Obstetrics and Gynecology | Admitting: Obstetrics and Gynecology

## 2013-05-06 ENCOUNTER — Other Ambulatory Visit: Payer: Self-pay | Admitting: Obstetrics and Gynecology

## 2013-05-06 DIAGNOSIS — N63 Unspecified lump in unspecified breast: Secondary | ICD-10-CM

## 2013-05-06 DIAGNOSIS — Z01419 Encounter for gynecological examination (general) (routine) without abnormal findings: Secondary | ICD-10-CM | POA: Insufficient documentation

## 2013-05-06 DIAGNOSIS — Z1151 Encounter for screening for human papillomavirus (HPV): Secondary | ICD-10-CM | POA: Insufficient documentation

## 2013-05-07 ENCOUNTER — Encounter: Payer: Self-pay | Admitting: Endocrinology

## 2013-05-07 ENCOUNTER — Ambulatory Visit (INDEPENDENT_AMBULATORY_CARE_PROVIDER_SITE_OTHER): Payer: BC Managed Care – PPO | Admitting: Endocrinology

## 2013-05-07 VITALS — BP 136/80 | HR 80 | Ht 68.0 in | Wt 251.0 lb

## 2013-05-07 DIAGNOSIS — E1049 Type 1 diabetes mellitus with other diabetic neurological complication: Secondary | ICD-10-CM

## 2013-05-07 MED ORDER — INSULIN LISPRO 100 UNIT/ML (KWIKPEN): 5.0000 [IU] | PEN_INJECTOR | Freq: Three times a day (TID) | SUBCUTANEOUS | Status: DC

## 2013-05-07 MED ORDER — INSULIN GLARGINE 100 UNIT/ML SOLOSTAR PEN: 50.0000 [IU] | PEN_INJECTOR | Freq: Every day | SUBCUTANEOUS | Status: DC

## 2013-05-07 NOTE — Patient Instructions (Addendum)
good diet and exercise habits significanly improve the control of your diabetes.  please let me know if you wish to be referred to a dietician.  high blood sugar is very risky to your health.  you should see an eye doctor every year.  You are at higher than average risk for pneumonia and hepatitis-B.  You should be vaccinated against both.   controlling your blood pressure and cholesterol drastically reduces the damage diabetes does to your body.  this also applies to quitting smoking.  please discuss these with your doctor.  check your blood sugar twice a day.  vary the time of day when you check, between before the 3 meals, and at bedtime.  also check if you have symptoms of your blood sugar being too high or too low.  please keep a record of the readings and bring it to your next appointment here.  please call us sooner if your blood sugar goes below 70, or if you have a lot of readings over 200. Please stop the victoza and metformin.   Please continue the same lantus.  Also, please start humalog, 5 units 3 times a day (just before each meal).   Please come back for a follow-up appointment in 2 weeks.
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
## 2013-05-11 ENCOUNTER — Encounter: Payer: Self-pay | Admitting: Endocrinology

## 2013-05-18 ENCOUNTER — Ambulatory Visit
Admission: RE | Admit: 2013-05-18 | Discharge: 2013-05-18 | Disposition: A | Discharge status: Home / Self Care | Payer: BC Managed Care – PPO | Source: Ambulatory Visit | Attending: Obstetrics and Gynecology | Admitting: Obstetrics and Gynecology

## 2013-05-18 DIAGNOSIS — N63 Unspecified lump in unspecified breast: Secondary | ICD-10-CM

## 2013-05-21 ENCOUNTER — Ambulatory Visit (INDEPENDENT_AMBULATORY_CARE_PROVIDER_SITE_OTHER): Payer: BC Managed Care – PPO | Admitting: Endocrinology

## 2013-05-21 ENCOUNTER — Encounter: Payer: Self-pay | Admitting: *Deleted

## 2013-05-21 ENCOUNTER — Encounter: Payer: Self-pay | Admitting: Endocrinology

## 2013-05-21 VITALS — BP 134/74 | HR 90 | Ht 68.0 in | Wt 258.0 lb

## 2013-05-21 DIAGNOSIS — E1049 Type 1 diabetes mellitus with other diabetic neurological complication: Secondary | ICD-10-CM

## 2013-05-21 MED ORDER — INSULIN LISPRO 100 UNIT/ML (KWIKPEN): 30.0000 [IU] | PEN_INJECTOR | Freq: Three times a day (TID) | SUBCUTANEOUS | Status: DC

## 2013-05-21 NOTE — Patient Instructions (Addendum)
check your blood sugar twice a day.  vary the time of day when you check, between before the 3 meals, and at bedtime.  also check if you have symptoms of your blood sugar being too high or too low.  please keep a record of the readings and bring it to your next appointment here.  please call us sooner if your blood sugar goes below 70, or if you have a lot of readings over 200. Please continue the same lantus, and:  Please increas humalog to 30 units 3 times a day (just before each meal).   Please come back for a follow-up appointment in 1 month.
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
## 2013-06-22 ENCOUNTER — Ambulatory Visit (INDEPENDENT_AMBULATORY_CARE_PROVIDER_SITE_OTHER): Payer: BC Managed Care – PPO | Admitting: Endocrinology

## 2013-06-22 VITALS — BP 140/86 | Temp 98.2°F | Wt 264.5 lb

## 2013-06-22 DIAGNOSIS — E1049 Type 1 diabetes mellitus with other diabetic neurological complication: Secondary | ICD-10-CM
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
## 2013-06-22 NOTE — Patient Instructions (Addendum)
check your blood sugar twice a day.  vary the time of day when you check, between before the 3 meals, and at bedtime.  also check if you have symptoms of your blood sugar being too high or too low.  please keep a record of the readings and bring it to your next appointment here.  please call us sooner if your blood sugar goes below 70, or if you have a lot of readings over 200. Please reduce the same lantus to 40 units at bedtime, and:  Please increase humalog to 3 times a day (just before each meal), 45-30-45 units.  Please come back for a follow-up appointment in 6 weeks, when linda is here.

## 2013-06-29 ENCOUNTER — Emergency Department (INDEPENDENT_AMBULATORY_CARE_PROVIDER_SITE_OTHER)
Admission: EM | Admit: 2013-06-29 | Discharge: 2013-06-29 | Disposition: A | Discharge status: Home / Self Care | Payer: BC Managed Care – PPO | Source: Home / Self Care | Attending: Family Medicine | Admitting: Family Medicine

## 2013-06-29 ENCOUNTER — Encounter (HOSPITAL_COMMUNITY): Payer: Self-pay | Admitting: Emergency Medicine

## 2013-06-29 DIAGNOSIS — E1049 Type 1 diabetes mellitus with other diabetic neurological complication: Secondary | ICD-10-CM

## 2013-06-29 DIAGNOSIS — J40 Bronchitis, not specified as acute or chronic: Secondary | ICD-10-CM

## 2013-06-29 DIAGNOSIS — R0982 Postnasal drip: Secondary | ICD-10-CM

## 2013-06-29 MED ORDER — AMOXICILLIN-POT CLAVULANATE 875-125 MG PO TABS: 1.0000 | ORAL_TABLET | Freq: Two times a day (BID) | ORAL | Status: DC

## 2013-06-29 MED ORDER — IPRATROPIUM BROMIDE 0.03 % NA SOLN: 2.0000 | Freq: Two times a day (BID) | NASAL | Status: DC

## 2013-06-29 MED ORDER — HYDROCODONE-ACETAMINOPHEN 5-325 MG PO TABS: 0.5000 | ORAL_TABLET | Freq: Every evening | ORAL | Status: DC | PRN

## 2013-06-29 NOTE — ED Notes (Signed)
C/o upper respiratory symptoms.  Onset 10/31.  Denies fever.  Coughing up green phlegm.  Reports history of the same .
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
## 2013-07-02 ENCOUNTER — Other Ambulatory Visit: Payer: Self-pay

## 2013-08-04 ENCOUNTER — Encounter: Payer: Self-pay | Admitting: Endocrinology

## 2013-08-04 ENCOUNTER — Encounter: Payer: BC Managed Care – PPO | Attending: Endocrinology | Admitting: Nutrition

## 2013-08-04 ENCOUNTER — Ambulatory Visit (INDEPENDENT_AMBULATORY_CARE_PROVIDER_SITE_OTHER): Payer: BC Managed Care – PPO | Admitting: Endocrinology

## 2013-08-04 VITALS — BP 124/70 | HR 106 | Temp 98.0°F

## 2013-08-04 VITALS — Wt 272.0 lb

## 2013-08-04 DIAGNOSIS — IMO0002 Reserved for concepts with insufficient information to code with codable children: Secondary | ICD-10-CM

## 2013-08-04 DIAGNOSIS — E1142 Type 2 diabetes mellitus with diabetic polyneuropathy: Secondary | ICD-10-CM

## 2013-08-04 DIAGNOSIS — E1065 Type 1 diabetes mellitus with hyperglycemia: Secondary | ICD-10-CM

## 2013-08-04 DIAGNOSIS — E109 Type 1 diabetes mellitus without complications: Secondary | ICD-10-CM | POA: Insufficient documentation

## 2013-08-04 DIAGNOSIS — E1049 Type 1 diabetes mellitus with other diabetic neurological complication: Secondary | ICD-10-CM

## 2013-08-04 DIAGNOSIS — E1149 Type 2 diabetes mellitus with other diabetic neurological complication: Secondary | ICD-10-CM

## 2013-08-04 DIAGNOSIS — E1140 Type 2 diabetes mellitus with diabetic neuropathy, unspecified: Secondary | ICD-10-CM

## 2013-08-04 DIAGNOSIS — Z713 Dietary counseling and surveillance: Secondary | ICD-10-CM | POA: Insufficient documentation

## 2013-08-04 LAB — HEMOGLOBIN A1C: Hgb A1c MFr Bld: 9.6 % — ABNORMAL HIGH (ref 4.6–6.5)

## 2013-08-04 MED ORDER — INSULIN ASPART 100 UNIT/ML FLEXPEN: PEN_INJECTOR | SUBCUTANEOUS | Status: DC
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
## 2013-08-04 NOTE — Progress Notes (Signed)
Pt. Reports that since her last visit, she has been watching her diet much better--limiting the amounts/quantities of foods eaten at each meal, yet her weight has gone up 12 pounds!  She is very discouraged.    Bfast:  She eats most of her meals out, she goes to [employer] for a muffin and drinks unsweet tea.  She takes 45u of Novolog for this. Mid morning:  She is hungry about 10:30 and will have a handful of nuts. Lunch:  Grilled chicken sandwhich, with a salad with thousand island dressing, or pretzels or baked potato chips.  She will drink unsweet tea. Supper is sometimes another sandwhich, or bowl of cereal, or takeout of [place], or [place]  Insulin dose:  Lantus 50u,  Novolog: 45/30/55 she is taking this after meals--sometimes 10-30 min.    SBGM.  Testing only twice daily.  Blood sugars are variable because carbs are variable at each meal.   Exercise:  She is not doing anything.   Low blood sugars.  Pt. Reported waking up hot twice in the last 2 weeks.  Did not feel weak or shakey.  Did not test her blood sugars at those times.  Suggested that she always do this to rule out that it is indeed a low blood sugar.  Suggested that it could be a drop in blood sugar from a high reading (after cereal) and that the blood sugars may not be low.  She agreed to do this.   Discussed the importance of testing her blood sugars 4 times/day, to determine what is running up her blood sugars.  Discussed the need for protein at each meal--to prevent hunger, and the need to limit the amount of fat at each meal.  Discussed ways to lower fat when eating out.  Hand out given on this.  Also discussed the importance of eating balanced meals to prevent hunger between meals, and for better blood sugar control.  Explained that protein will release sugar into the blood stream more slowly, preventing low blood sugars before the next meal, and to keep her feeling more energized and less hungry between meals.  She agreed to  add protein to breakfast and supper meals.  Suggestions given for this.  Also discussed that fat has the most calories, and that she should use the least amount possible in salad dressings, and frying foods.  She agreed to this as well.  Discussed the timing of the Novolog insulin, and the importance of taking the insulin before the meals.  She has agreed to take it before all meals.

## 2013-08-04 NOTE — Patient Instructions (Addendum)
check your blood sugar twice a day.  vary the time of day when you check, between before the 3 meals, and at bedtime.  also check if you have symptoms of your blood sugar being too high or too low.  please keep a record of the readings and bring it to your next appointment here.  please call us sooner if your blood sugar goes below 70, or if you have a lot of readings over 200. Please increase novolog, and decrease lantus, as below.  Please come back for a follow-up appointment in 3 months.

## 2013-08-05 ENCOUNTER — Encounter: Payer: Self-pay | Admitting: Family Medicine

## 2013-08-05 ENCOUNTER — Other Ambulatory Visit: Payer: Self-pay | Admitting: Physician Assistant

## 2013-08-05 MED ORDER — LISINOPRIL 20 MG PO TABS: 20.0000 mg | ORAL_TABLET | Freq: Two times a day (BID) | ORAL | Status: DC

## 2013-08-10 ENCOUNTER — Ambulatory Visit: Payer: BC Managed Care – PPO

## 2013-08-10 ENCOUNTER — Ambulatory Visit (INDEPENDENT_AMBULATORY_CARE_PROVIDER_SITE_OTHER): Payer: BC Managed Care – PPO | Admitting: Family Medicine

## 2013-08-10 ENCOUNTER — Telehealth: Payer: Self-pay | Admitting: Radiology

## 2013-08-10 VITALS — BP 154/82 | HR 101 | Temp 98.9°F | Resp 18 | Ht 68.0 in | Wt 277.0 lb

## 2013-08-10 DIAGNOSIS — R0602 Shortness of breath: Secondary | ICD-10-CM

## 2013-08-10 DIAGNOSIS — J40 Bronchitis, not specified as acute or chronic: Secondary | ICD-10-CM

## 2013-08-10 DIAGNOSIS — R059 Cough, unspecified: Secondary | ICD-10-CM

## 2013-08-10 DIAGNOSIS — R05 Cough: Secondary | ICD-10-CM

## 2013-08-10 DIAGNOSIS — S29012A Strain of muscle and tendon of back wall of thorax, initial encounter: Secondary | ICD-10-CM

## 2013-08-10 DIAGNOSIS — D649 Anemia, unspecified: Secondary | ICD-10-CM

## 2013-08-10 DIAGNOSIS — R0789 Other chest pain: Secondary | ICD-10-CM

## 2013-08-10 LAB — POCT CBC
Granulocyte percent: 56.4 %G (ref 37–80)
Hemoglobin: 10.9 g/dL — AB (ref 12.2–16.2)
MCH, POC: 27.5 pg (ref 27–31.2)
MCV: 89.9 fL (ref 80–97)
MID (cbc): 1 — AB (ref 0–0.9)
Platelet Count, POC: 145 10*3/uL (ref 142–424)
RBC: 3.96 M/uL — AB (ref 4.04–5.48)
RDW, POC: 15.2 %
WBC: 12.3 10*3/uL — AB (ref 4.6–10.2)

## 2013-08-10 MED ORDER — HYDROCOD POLST-CHLORPHEN POLST 10-8 MG/5ML PO LQCR: 5.0000 mL | Freq: Two times a day (BID) | ORAL | Status: DC | PRN

## 2013-08-10 MED ORDER — FLUTICASONE PROPIONATE 50 MCG/ACT NA SUSP: 2.0000 | Freq: Every day | NASAL | Status: DC

## 2013-08-10 MED ORDER — AZITHROMYCIN 250 MG PO TABS: ORAL_TABLET | ORAL | Status: DC

## 2013-08-10 MED ORDER — IPRATROPIUM BROMIDE 0.02 % IN SOLN
0.5000 mg | Freq: Once | RESPIRATORY_TRACT | Status: DC
  Administered 2013-08-10: 0.5 mg via RESPIRATORY_TRACT

## 2013-08-10 MED ORDER — ALBUTEROL SULFATE (2.5 MG/3ML) 0.083% IN NEBU
2.5000 mg | INHALATION_SOLUTION | Freq: Once | RESPIRATORY_TRACT | Status: DC
  Administered 2013-08-10: 2.5 mg via RESPIRATORY_TRACT

## 2013-08-10 MED ORDER — CYCLOBENZAPRINE HCL 10 MG PO TABS: 10.0000 mg | ORAL_TABLET | Freq: Three times a day (TID) | ORAL | Status: DC | PRN

## 2013-08-10 MED ORDER — METAXALONE 800 MG PO TABS: 800.0000 mg | ORAL_TABLET | Freq: Four times a day (QID) | ORAL | Status: DC | PRN

## 2013-08-10 NOTE — Telephone Encounter (Signed)
Dr Clelia Croft advised of Triponin 1 level, normal.

## 2013-08-10 NOTE — Patient Instructions (Signed)
I recommend keeping tylenol on board - esp taking before and during work. Then when you come home, take a muscle relaxant followed by 15 minutes of heat followed by gentle stretching. Try to do this regiment 2 - 3 times a day.  If you are still having pain in 4 to 6 wks, come back to clinic for further eval. RTC immed if symptoms worsen or you develop any other concerning symptoms.
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
## 2013-08-13 ENCOUNTER — Encounter: Payer: Self-pay | Admitting: Family Medicine

## 2013-08-16 ENCOUNTER — Telehealth: Payer: Self-pay

## 2013-08-16 NOTE — Telephone Encounter (Signed)
Please advise, doesn't the ZPAK work days after the last dose?

## 2013-08-16 NOTE — Telephone Encounter (Signed)
Correct - zpack actually lasts in the system for about 12d!  There is no point in repeating a zpack - if it doesn't work or only partially works, repeating the same medication won't help at all - if the bacterial pneumonia is susceptible to it, then a 5d course will be more than sufficient.  However, I will be happy to call her in a different antibiotic if she feels like her recovery has plateaued or illness is persisting.

## 2013-08-16 NOTE — Telephone Encounter (Signed)
[phone] PATIENT IS CALLING TO ASK DR SHAW IF SHE COULD HAVE ANOTHER ZPAK IF POSSIBLE  SHE IS STILL NOT FEELING WELL

## 2013-08-17 NOTE — Telephone Encounter (Signed)
Called patient to advise. Left message for her to call me back  

## 2013-08-18 NOTE — Telephone Encounter (Signed)
Sent patient a my chart message, she has not called back.

## 2013-08-25 ENCOUNTER — Other Ambulatory Visit: Payer: Self-pay | Admitting: Endocrinology

## 2013-08-28 ENCOUNTER — Other Ambulatory Visit: Payer: Self-pay | Admitting: Family Medicine

## 2013-08-28 MED ORDER — LISINOPRIL 20 MG PO TABS: 20.0000 mg | ORAL_TABLET | Freq: Two times a day (BID) | ORAL | Status: DC

## 2013-09-11 MED ORDER — INSULIN GLARGINE 100 UNIT/ML SOLOSTAR PEN: 30.0000 [IU] | PEN_INJECTOR | Freq: Every day | SUBCUTANEOUS | Status: DC

## 2013-10-10 ENCOUNTER — Ambulatory Visit: Payer: BC Managed Care – PPO

## 2013-10-10 ENCOUNTER — Ambulatory Visit (INDEPENDENT_AMBULATORY_CARE_PROVIDER_SITE_OTHER): Payer: BC Managed Care – PPO | Admitting: Emergency Medicine

## 2013-10-10 VITALS — BP 160/78 | HR 93 | Temp 98.9°F | Resp 18 | Ht 68.0 in | Wt 280.1 lb

## 2013-10-10 DIAGNOSIS — R490 Dysphonia: Secondary | ICD-10-CM

## 2013-10-10 DIAGNOSIS — R059 Cough, unspecified: Secondary | ICD-10-CM

## 2013-10-10 DIAGNOSIS — R05 Cough: Secondary | ICD-10-CM

## 2013-10-10 DIAGNOSIS — J209 Acute bronchitis, unspecified: Secondary | ICD-10-CM

## 2013-10-10 DIAGNOSIS — R0602 Shortness of breath: Secondary | ICD-10-CM

## 2013-10-10 DIAGNOSIS — R5383 Other fatigue: Secondary | ICD-10-CM

## 2013-10-10 DIAGNOSIS — R5381 Other malaise: Secondary | ICD-10-CM

## 2013-10-10 MED ORDER — AZITHROMYCIN 250 MG PO TABS: ORAL_TABLET | ORAL | Status: DC

## 2013-10-10 MED ORDER — HYDROCOD POLST-CHLORPHEN POLST 10-8 MG/5ML PO LQCR: 5.0000 mL | Freq: Two times a day (BID) | ORAL | Status: DC | PRN
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
## 2013-10-10 NOTE — Patient Instructions (Signed)

## 2013-10-30 ENCOUNTER — Ambulatory Visit (INDEPENDENT_AMBULATORY_CARE_PROVIDER_SITE_OTHER): Payer: BC Managed Care – PPO | Admitting: Emergency Medicine

## 2013-10-30 VITALS — BP 168/70 | HR 106 | Temp 99.6°F | Resp 16 | Ht 68.0 in | Wt 279.0 lb

## 2013-10-30 DIAGNOSIS — A088 Other specified intestinal infections: Secondary | ICD-10-CM

## 2013-10-30 DIAGNOSIS — E119 Type 2 diabetes mellitus without complications: Secondary | ICD-10-CM

## 2013-10-30 LAB — COMPLETE METABOLIC PANEL WITH GFR
ALK PHOS: 86 U/L (ref 39–117)
ALT: 10 U/L (ref 0–35)
AST: 16 U/L (ref 0–37)
Albumin: 3.7 g/dL (ref 3.5–5.2)
BUN: 13 mg/dL (ref 6–23)
CO2: 21 mEq/L (ref 19–32)
Calcium: 8.5 mg/dL (ref 8.4–10.5)
Chloride: 100 mEq/L (ref 96–112)
Creat: 0.58 mg/dL (ref 0.50–1.10)
GFR, Est African American: >89 mL/min
GLUCOSE: 193 mg/dL — AB (ref 70–99)
POTASSIUM: 4.8 meq/L (ref 3.5–5.3)
Sodium: 135 mEq/L (ref 135–145)
TOTAL PROTEIN: 6.6 g/dL (ref 6.0–8.3)
Total Bilirubin: 0.4 mg/dL (ref 0.2–1.2)

## 2013-10-30 LAB — POCT CBC
GRANULOCYTE PERCENT: 81.8 % — AB (ref 37–80)
HCT, POC: 40.4 % (ref 37.7–47.9)
Hemoglobin: 12.8 g/dL (ref 12.2–16.2)
Lymph, poc: 1.1 (ref 0.6–3.4)
MCH, POC: 27.8 pg (ref 27–31.2)
MCHC: 31.7 g/dL — AB (ref 31.8–35.4)
MCV: 87.6 fL (ref 80–97)
MID (CBC): 0.5 (ref 0–0.9)
MPV: 10 fL (ref 0–99.8)
PLATELET COUNT, POC: 303 10*3/uL (ref 142–424)
POC Granulocyte: 7.4 — AB (ref 2–6.9)
POC LYMPH PERCENT: 12.4 %L (ref 10–50)
POC MID %: 5.8 %M (ref 0–12)
RBC: 4.61 M/uL (ref 4.04–5.48)
RDW, POC: 14.8 %
WBC: 9.1 10*3/uL (ref 4.6–10.2)

## 2013-10-30 LAB — GLUCOSE, POCT (MANUAL RESULT ENTRY): POC GLUCOSE: 165 mg/dL — AB (ref 70–99)

## 2013-10-30 MED ORDER — ONDANSETRON 4 MG PO TBDP
8.0000 mg | ORAL_TABLET | Freq: Once | ORAL | Status: AC
  Administered 2013-10-30: 8 mg via ORAL

## 2013-10-30 MED ORDER — ONDANSETRON 8 MG PO TBDP
8.0000 mg | ORAL_TABLET | Freq: Four times a day (QID) | ORAL | Status: DC | PRN
Start: 2013-10-30 — End: 2013-11-16

## 2013-10-30 MED ORDER — LOPERAMIDE HCL 2 MG PO TABS: ORAL_TABLET | ORAL | Status: DC
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
## 2013-10-30 NOTE — Patient Instructions (Signed)
The clear liquid diet consists of foods that are liquid or will become liquid at room temperature. Examples of foods allowed on a clear liquid diet include fruit juice, broth or bouillon, gelatin, or frozen ice pops. You should be able to see through the liquid. The purpose of this diet is to provide the necessary fluids, electrolytes (such as sodium and potassium), and energy to keep the body functioning during times when you are not able to consume a regular diet. A clear liquid diet should not be continued for long periods of time, as it is not nutritionally adequate.  A CLEAR LIQUID DIET MAY BE NEEDED:  When a sudden-onset (acute) condition occurs before or after surgery.   As the first step in oral feeding.   For fluid and electrolyte replacement in diarrheal diseases.   As a diet before certain medical tests are performed.  ADEQUACY The clear liquid diet is adequate only in ascorbic acid, according to the Recommended Dietary Allowances of the [employer].  CHOOSING FOODS Breads and Starches  Allowed: None are allowed.   Avoid: All are to be avoided.  Vegetables  Allowed: Strained vegetable juices.   Avoid: Any others.  Fruit  Allowed: Strained fruit juices and fruit drinks. Include 1 serving of citrus or vitamin C-enriched fruit juice daily.   Avoid: Any others.  Meat and Meat Substitutes  Allowed: None are allowed.   Avoid: All are to be avoided.  Milk Products  Allowed: None are allowed.   Avoid: All are to be avoided.  Soups and Combination Foods  Allowed: Clear bouillon, broth, or strained broth-based soups.   Avoid: Any others.  Desserts and Sweets  Allowed: Sugar, honey. High-protein gelatin. Flavored gelatin, ices, or frozen ice pops that do not contain milk.   Avoid: Any others.  Fats and Oils  Allowed: None are allowed.   Avoid: All are to be avoided.  Beverages  Allowed: Cereal  beverages, coffee (regular or decaffeinated), tea, or soda at the discretion of your health care provider.   Avoid: Any others.  Condiments  Allowed: Salt.   Avoid: Any others, including pepper.  Supplements  Allowed: Liquid nutrition beverages that you can see through.   Avoid: Any others that contain lactose or fiber. SAMPLE MEAL PLAN Breakfast  4 oz (120 mL) strained orange juice.   to 1 cup (120 to 240 mL) gelatin (plain or fortified).  1 cup (240 mL) beverage (coffee or tea).  Sugar, if desired. Midmorning Snack   cup (120 mL) gelatin (plain or fortified). Lunch  1 cup (240 mL) broth or consomm.  4 oz (120 mL) strained grapefruit juice.   cup (120 mL) gelatin (plain or fortified).  1 cup (240 mL) beverage (coffee or tea).  Sugar, if desired. Midafternoon Snack   cup (120 mL) fruit ice.   cup (120 mL) strained fruit juice. Dinner  1 cup (240 mL) broth or consomm.   cup (120 mL) cranberry juice.   cup (120 mL) flavored gelatin (plain or fortified).  1 cup (240 mL) beverage (coffee or tea).  Sugar, if desired. Evening Snack  4 oz (120 mL) strained apple juice (vitamin C-fortified).   cup (120 mL) flavored gelatin (plain or fortified). MAKE SURE YOU:  Understand these instructions.  Will watch your child's condition.  Will get help right away if your child is not doing well or gets worse. Document Released: 08/13/2005 Document Revised: 04/15/2013 Document Reviewed: 01/13/2013 [facility] Patient Information 2014 [place]. Viral Gastroenteritis  Viral gastroenteritis is also known as stomach flu. This condition affects the stomach and intestinal tract. It can cause sudden diarrhea and vomiting. The illness typically lasts 3 to 8 days. Most people develop an immune response that eventually gets rid of the virus. While this natural response develops, the virus can make you quite ill. CAUSES  Many different viruses can cause  gastroenteritis, such as rotavirus or noroviruses. You can catch one of these viruses by consuming contaminated food or water. You may also catch a virus by sharing utensils or other personal items with an infected person or by touching a contaminated surface. SYMPTOMS  The most common symptoms are diarrhea and vomiting. These problems can cause a severe loss of body fluids (dehydration) and a body salt (electrolyte) imbalance. Other symptoms may include:  Fever.  Headache.  Fatigue.  Abdominal pain. DIAGNOSIS  Your caregiver can usually diagnose viral gastroenteritis based on your symptoms and a physical exam. A stool sample may also be taken to test for the presence of viruses or other infections. TREATMENT  This illness typically goes away on its own. Treatments are aimed at rehydration. The most serious cases of viral gastroenteritis involve vomiting so severely that you are not able to keep fluids down. In these cases, fluids must be given through an intravenous line (IV). HOME CARE INSTRUCTIONS   Drink enough fluids to keep your urine clear or pale yellow. Drink small amounts of fluids frequently and increase the amounts as tolerated.  Ask your caregiver for specific rehydration instructions.  Avoid:  Foods high in sugar.  Alcohol.  Carbonated drinks.  Tobacco.  Juice.  Caffeine drinks.  Extremely hot or cold fluids.  Fatty, greasy foods.  Too much intake of anything at one time.  Dairy products until 24 to 48 hours after diarrhea stops.  You may consume probiotics. Probiotics are active cultures of beneficial bacteria. They may lessen the amount and number of diarrheal stools in adults. Probiotics can be found in yogurt with active cultures and in supplements.  Wash your hands well to avoid spreading the virus.  Only take over-the-counter or prescription medicines for pain, discomfort, or fever as directed by your caregiver. Do not give aspirin to children.  Antidiarrheal medicines are not recommended.  Ask your caregiver if you should continue to take your regular prescribed and over-the-counter medicines.  Keep all follow-up appointments as directed by your caregiver. SEEK IMMEDIATE MEDICAL CARE IF:   You are unable to keep fluids down.  You do not urinate at least once every 6 to 8 hours.  You develop shortness of breath.  You notice blood in your stool or vomit. This may look like coffee grounds.  You have abdominal pain that increases or is concentrated in one small area (localized).  You have persistent vomiting or diarrhea.  You have a fever.  The patient is a child younger than 3 months, and he or she has a fever.  The patient is a child older than 3 months, and he or she has a fever and persistent symptoms.  The patient is a child older than 3 months, and he or she has a fever and symptoms suddenly get worse.  The patient is a baby, and he or she has no tears when crying. MAKE SURE YOU:   Understand these instructions.  Will watch your condition.  Will get help right away if you are not doing well or get worse. Document Released: 08/13/2005 Document Revised: 11/05/2011 Document Reviewed: 05/30/2011 [employer]  Patient Information 2014 [employer].  

## 2013-11-16 ENCOUNTER — Ambulatory Visit (INDEPENDENT_AMBULATORY_CARE_PROVIDER_SITE_OTHER): Payer: BC Managed Care – PPO | Admitting: Internal Medicine

## 2013-11-16 VITALS — BP 197/96 | HR 90 | Temp 99.0°F | Resp 20 | Ht 68.75 in | Wt 287.6 lb

## 2013-11-16 DIAGNOSIS — IMO0002 Reserved for concepts with insufficient information to code with codable children: Secondary | ICD-10-CM

## 2013-11-16 DIAGNOSIS — E1140 Type 2 diabetes mellitus with diabetic neuropathy, unspecified: Secondary | ICD-10-CM

## 2013-11-16 DIAGNOSIS — E1165 Type 2 diabetes mellitus with hyperglycemia: Secondary | ICD-10-CM

## 2013-11-16 DIAGNOSIS — I10 Essential (primary) hypertension: Secondary | ICD-10-CM

## 2013-11-16 DIAGNOSIS — E119 Type 2 diabetes mellitus without complications: Secondary | ICD-10-CM

## 2013-11-16 DIAGNOSIS — Z6841 Body Mass Index (BMI) 40.0 and over, adult: Secondary | ICD-10-CM

## 2013-11-16 DIAGNOSIS — R609 Edema, unspecified: Secondary | ICD-10-CM

## 2013-11-16 LAB — POCT CBC
GRANULOCYTE PERCENT: 41.6 % (ref 37–80)
HCT, POC: 33.2 % — AB (ref 37.7–47.9)
Hemoglobin: 10.5 g/dL — AB (ref 12.2–16.2)
Lymph, poc: 4.4 — AB (ref 0.6–3.4)
MCH, POC: 27.2 pg (ref 27–31.2)
MCHC: 31.6 g/dL — AB (ref 31.8–35.4)
MCV: 85.9 fL (ref 80–97)
MID (CBC): 0.5 (ref 0–0.9)
MPV: 8.9 fL (ref 0–99.8)
PLATELET COUNT, POC: 365 10*3/uL (ref 142–424)
POC Granulocyte: 3.5 (ref 2–6.9)
POC LYMPH PERCENT: 52 %L — AB (ref 10–50)
POC MID %: 6.4 % (ref 0–12)
RBC: 3.86 M/uL — AB (ref 4.04–5.48)
RDW, POC: 14.7 %
WBC: 8.4 10*3/uL (ref 4.6–10.2)

## 2013-11-16 MED ORDER — LISINOPRIL 20 MG PO TABS: 20.0000 mg | ORAL_TABLET | Freq: Two times a day (BID) | ORAL | Status: DC

## 2013-11-16 MED ORDER — CHLORTHALIDONE 25 MG PO TABS: 25.0000 mg | ORAL_TABLET | Freq: Every day | ORAL | Status: DC

## 2013-11-16 MED ORDER — FUROSEMIDE 40 MG PO TABS: 40.0000 mg | ORAL_TABLET | Freq: Every day | ORAL | Status: DC

## 2013-11-16 MED ORDER — POTASSIUM CHLORIDE CRYS ER 20 MEQ PO TBCR
20.0000 meq | EXTENDED_RELEASE_TABLET | Freq: Every day | ORAL | Status: DC
Start: 2013-11-16 — End: 2013-11-30

## 2013-11-16 NOTE — Progress Notes (Signed)
Subjective:    Patient ID: Donna Kidd, female    DOB: 01-May-1966, 48 y.o.   MRN: [id number] This chart was scribed for Donna Sia, MD by Nicholos Johns, Medical Scribe. This patient's care was started at 8:46 PM.  HPI HPI Comments: Donna Kidd is a 48 y.o. female who [facility] complaining of painful, swelling in the feet and lower legs bilaterally. States legs feel so heavy she cannot cross them. Believes Lisinopril is contributing to leg swelling. Reports Dr. Clelia Croft changed her BP to hyzaar 4/14 and then hyzaar to Lisinopril on 04/27/2013 due to excessive burping. Says BP has been running kind of high; has not taken second pill today. Compliance is not always regular.  Sugar checked a couple hours ago noted at 91.  Takes Novalog for DM.  Has been on birth control since 05/2013. Wonders if this has caused increased swelling Thyroid checked 1 year ago. Mother has a hx of thyroid trouble and took synthroid to manage.  OB/GYN has her on birth control as a hormonal supplement She thinks her swelling has increased since tighter control was started with NovoLog  Review of Systems  Constitutional: Negative for appetite change and unexpected weight change.  Eyes: Negative for visual disturbance.  Respiratory: Negative for cough, choking, chest tightness, shortness of breath and wheezing.        Resolvet chest infection from December  Cardiovascular: Positive for leg swelling. Negative for chest pain and palpitations.       No dyspnea on exertion/no paroxysmal nocturnal dyspnea,  or symptoms of sleep apnea.  Genitourinary: Negative for frequency.  Neurological: Negative for headaches.  Psychiatric/Behavioral: Negative for sleep disturbance and dysphoric mood.   Objective:   Physical Exam  Vitals reviewed. Constitutional: She is oriented to person, place, and time. She appears well-developed and well-nourished. No distress.  Morbidly obese  HENT:   Head: Normocephalic and atraumatic.  Eyes: Conjunctivae and EOM are normal. Pupils are equal, round, and reactive to light.  Neck: Neck supple. No thyromegaly present.  Cardiovascular: Normal rate, regular rhythm, normal heart sounds and intact distal pulses.  Exam reveals no gallop.   No murmur heard. Pulmonary/Chest: Effort normal and breath sounds normal. No respiratory distress.  Abdominal: She exhibits no distension and no mass.  No organomegaly  Musculoskeletal: Normal range of motion. She exhibits edema.  She has trace pitting however both lower extremities although her legs feel swollen from the knees down There are no calf defects or masses//Homans signs negative/good peripheral pulses/decreased sensation to light-touch over the plantar aspect of both feet She is unable to cross her legs.  Lymphadenopathy:    She has no cervical adenopathy.  Neurological: She is alert and oriented to person, place, and time. No cranial nerve deficit.  Skin: Skin is warm and dry.  Psychiatric: She has a normal mood and affect. Her behavior is normal.   Assessment & Plan:    I have completed the patient encounter in its entirety as documented by the scribe, with editing by me where necessary. Jediah Horger P. Merla Riches, M.D.  Edema -? Etiology Plan: POCT CBC, TSH, T4, free  to use Lasix plus potassium for 10 days and then start chlorthalidone  As part of treatment for hypertension.  If this is not successful then we might consider neurological evaluation to see if her  diabetic neuropathy is the cause of her symptoms  Essential hypertension, benign  Continue lisinopril 20 mg twice a day-chlorthalidone  DM (diabetes mellitus) BMI 40.0-44.9, adult Type 2 diabetes, uncontrolled, with neuropathy  Weight loss is imperative and perhaps gastric bypass should be considered  Meds ordered this encounter  Medications  . furosemide (LASIX) 40 MG tablet    Sig: Take 1 tablet (40 mg total) by mouth daily.     Dispense:  10 tablet    Refill:  0  . lisinopril (PRINIVIL,ZESTRIL) 20 MG tablet    Sig: Take 1 tablet (20 mg total) by mouth 2 (two) times daily.    Dispense:  180 tablet    Refill:  1    Order Specific Question:  Supervising Provider    Answer:  Christin Mccreedy P [[id number]]  . potassium chloride SA (K-DUR,KLOR-CON) 20 MEQ tablet    Sig: Take 1 tablet (20 mEq total) by mouth daily.    Dispense:  10 tablet    Refill:  0  . chlorthalidone (HYGROTON) 25 MG tablet    Sig: Take 1 tablet (25 mg total) by mouth daily.    Dispense:  30 tablet    Refill:  5   Followup one to 2 months depending her response

## 2013-11-17 DIAGNOSIS — E669 Obesity, unspecified: Secondary | ICD-10-CM | POA: Insufficient documentation

## 2013-11-18 LAB — TSH: TSH: 2.411 u[IU]/mL (ref 0.350–4.500)

## 2013-11-18 LAB — T4, FREE: FREE T4: 1.19 ng/dL (ref 0.80–1.80)

## 2013-11-22 ENCOUNTER — Encounter: Payer: Self-pay | Admitting: Internal Medicine

## 2013-11-22 DIAGNOSIS — R609 Edema, unspecified: Secondary | ICD-10-CM

## 2013-11-26 ENCOUNTER — Telehealth: Payer: Self-pay

## 2013-11-26 NOTE — Telephone Encounter (Signed)
Is there anything we can give her?

## 2013-11-26 NOTE — Telephone Encounter (Signed)
Patient called stated she seen Dr. Laney Pastor last Monday. He prescribed her Lasix 40 mg tablet. She has an appointment to see a specialist on December 21, 2013. Patient want to know if their is anything she can do for her foot until then. She is having extremely painful left foot pain. She is not able to keep a shoe or sock on her foot. I gave her Dr. Laney Pastor schedule for Friday November 26, 2013. (10-close) Patient stated between those hours please call her at work [phone]. After 5 pm. Call her at [phone].

## 2013-11-27 NOTE — Telephone Encounter (Signed)
Sounds like she may have something new besides the swelling, like gout or celluliutis so we should check her again fri sat or sun

## 2013-11-27 NOTE — Telephone Encounter (Signed)
Advised pt that she needs to come in to be evaluated.  She will try and come in either sat or sun

## 2013-11-30 ENCOUNTER — Encounter (HOSPITAL_COMMUNITY): Payer: Self-pay | Admitting: Emergency Medicine

## 2013-11-30 ENCOUNTER — Emergency Department (HOSPITAL_COMMUNITY)
Admission: EM | Admit: 2013-11-30 | Discharge: 2013-11-30 | Disposition: A | Discharge status: Home / Self Care | Payer: BC Managed Care – PPO | Attending: Emergency Medicine | Admitting: Emergency Medicine

## 2013-11-30 ENCOUNTER — Emergency Department (HOSPITAL_COMMUNITY): Payer: BC Managed Care – PPO

## 2013-11-30 DIAGNOSIS — Z79899 Other long term (current) drug therapy: Secondary | ICD-10-CM | POA: Insufficient documentation

## 2013-11-30 DIAGNOSIS — E669 Obesity, unspecified: Secondary | ICD-10-CM | POA: Insufficient documentation

## 2013-11-30 DIAGNOSIS — I10 Essential (primary) hypertension: Secondary | ICD-10-CM | POA: Insufficient documentation

## 2013-11-30 DIAGNOSIS — E1149 Type 2 diabetes mellitus with other diabetic neurological complication: Secondary | ICD-10-CM | POA: Insufficient documentation

## 2013-11-30 DIAGNOSIS — E1139 Type 2 diabetes mellitus with other diabetic ophthalmic complication: Secondary | ICD-10-CM | POA: Insufficient documentation

## 2013-11-30 DIAGNOSIS — M79673 Pain in unspecified foot: Secondary | ICD-10-CM

## 2013-11-30 DIAGNOSIS — M79609 Pain in unspecified limb: Secondary | ICD-10-CM | POA: Insufficient documentation

## 2013-11-30 DIAGNOSIS — Z8781 Personal history of (healed) traumatic fracture: Secondary | ICD-10-CM | POA: Insufficient documentation

## 2013-11-30 DIAGNOSIS — E11319 Type 2 diabetes mellitus with unspecified diabetic retinopathy without macular edema: Secondary | ICD-10-CM | POA: Insufficient documentation

## 2013-11-30 DIAGNOSIS — Z794 Long term (current) use of insulin: Secondary | ICD-10-CM | POA: Insufficient documentation

## 2013-11-30 DIAGNOSIS — E1142 Type 2 diabetes mellitus with diabetic polyneuropathy: Secondary | ICD-10-CM | POA: Insufficient documentation

## 2013-11-30 DIAGNOSIS — E1165 Type 2 diabetes mellitus with hyperglycemia: Secondary | ICD-10-CM

## 2013-11-30 LAB — CBC WITH DIFFERENTIAL/PLATELET
BASOS ABS: 0.1 10*3/uL (ref 0.0–0.1)
BASOS PCT: 1 % (ref 0–1)
EOS ABS: 0.3 10*3/uL (ref 0.0–0.7)
Eosinophils Relative: 3 % (ref 0–5)
HCT: 36.9 % (ref 36.0–46.0)
Hemoglobin: 12.1 g/dL (ref 12.0–15.0)
Lymphocytes Relative: 31 % (ref 12–46)
Lymphs Abs: 3.4 10*3/uL (ref 0.7–4.0)
MCH: 27.7 pg (ref 26.0–34.0)
MCHC: 32.8 g/dL (ref 30.0–36.0)
MCV: 84.4 fL (ref 78.0–100.0)
Monocytes Absolute: 0.6 10*3/uL (ref 0.1–1.0)
Monocytes Relative: 6 % (ref 3–12)
NEUTROS ABS: 6.5 10*3/uL (ref 1.7–7.7)
NEUTROS PCT: 60 % (ref 43–77)
Platelets: 387 10*3/uL (ref 150–400)
RBC: 4.37 MIL/uL (ref 3.87–5.11)
RDW: 13.4 % (ref 11.5–15.5)
WBC: 10.9 10*3/uL — ABNORMAL HIGH (ref 4.0–10.5)

## 2013-11-30 LAB — URIC ACID: URIC ACID, SERUM: 7.7 mg/dL — AB (ref 2.4–7.0)

## 2013-11-30 MED ORDER — HYDROCODONE-ACETAMINOPHEN 5-325 MG PO TABS
ORAL_TABLET | ORAL | Status: DC
Start: 2013-11-30 — End: 2013-12-04

## 2013-11-30 MED ORDER — ENOXAPARIN SODIUM 100 MG/ML ~~LOC~~ SOLN
100.0000 mg | Freq: Once | SUBCUTANEOUS | Status: AC
  Administered 2013-11-30: 100 mg via SUBCUTANEOUS
  Filled 2013-11-30: qty 1

## 2013-11-30 MED ORDER — KETOROLAC TROMETHAMINE 10 MG PO TABS: 10.0000 mg | ORAL_TABLET | Freq: Four times a day (QID) | ORAL | Status: DC | PRN
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
## 2013-11-30 NOTE — ED Notes (Addendum)
Pt has had chronic feet swelling for weeks. Has seen pcp and has tried several treatments including lasix with no relief. Sent by pcp to check for gout or cellulitis. left foot swollen and warm to touch. Is not tender to foot upon palpation.

## 2013-11-30 NOTE — ED Notes (Signed)
Pt arrives with c/o of swelling in bilateral feet, left foot more swelling than right. Painful, pt states its hard to bear weight. States she spoke with nurse in urgent care in [place], who advised that she needs to be checked for gout or cellulitis. No acute distress, pt A/O x4, skin warm, dry and normal color for race.

## 2013-12-01 ENCOUNTER — Emergency Department (HOSPITAL_COMMUNITY)
Admission: EM | Admit: 2013-12-01 | Discharge: 2013-12-01 | Discharge status: Against Medical Advice | Payer: BC Managed Care – PPO

## 2013-12-01 ENCOUNTER — Ambulatory Visit (HOSPITAL_COMMUNITY)
Admit: 2013-12-01 | Discharge: 2013-12-01 | Disposition: A | Discharge status: Home / Self Care | Payer: BC Managed Care – PPO | Source: Ambulatory Visit | Attending: Emergency Medicine | Admitting: Emergency Medicine

## 2013-12-01 DIAGNOSIS — M79609 Pain in unspecified limb: Secondary | ICD-10-CM | POA: Insufficient documentation

## 2013-12-01 DIAGNOSIS — M7989 Other specified soft tissue disorders: Secondary | ICD-10-CM | POA: Insufficient documentation

## 2013-12-02 ENCOUNTER — Encounter: Payer: Self-pay | Admitting: Internal Medicine

## 2013-12-04 ENCOUNTER — Ambulatory Visit (INDEPENDENT_AMBULATORY_CARE_PROVIDER_SITE_OTHER): Payer: BC Managed Care – PPO | Admitting: Podiatrist

## 2013-12-04 ENCOUNTER — Encounter: Payer: Self-pay | Admitting: Podiatrist

## 2013-12-04 VITALS — BP 146/77 | HR 92 | Resp 16 | Ht 68.0 in | Wt 270.0 lb

## 2013-12-04 DIAGNOSIS — I872 Venous insufficiency (chronic) (peripheral): Secondary | ICD-10-CM

## 2013-12-04 DIAGNOSIS — L03119 Cellulitis of unspecified part of limb: Principal | ICD-10-CM

## 2013-12-04 DIAGNOSIS — L02619 Cutaneous abscess of unspecified foot: Secondary | ICD-10-CM

## 2013-12-04 DIAGNOSIS — R609 Edema, unspecified: Secondary | ICD-10-CM

## 2013-12-04 MED ORDER — HYDROCODONE-ACETAMINOPHEN 5-325 MG PO TABS: ORAL_TABLET | ORAL | Status: DC

## 2013-12-04 MED ORDER — AMOXICILLIN-POT CLAVULANATE 875-125 MG PO TABS: 1.0000 | ORAL_TABLET | Freq: Two times a day (BID) | ORAL | Status: DC

## 2013-12-04 NOTE — Patient Instructions (Signed)
Peripheral Edema You have swelling in your legs (peripheral edema). This swelling is due to excess accumulation of salt and water in your body. Edema may be a sign of heart, kidney or liver disease, or a side effect of a medication. It may also be due to problems in the leg veins. Elevating your legs and using special support stockings may be very helpful, if the cause of the swelling is due to poor venous circulation. Avoid long periods of standing, whatever the cause. Treatment of edema depends on identifying the cause. Chips, pretzels, pickles and other salty foods should be avoided. Restricting salt in your diet is almost always needed. Water pills (diuretics) are often used to remove the excess salt and water from your body via urine. These medicines prevent the kidney from reabsorbing sodium. This increases urine flow. Diuretic treatment may also result in lowering of potassium levels in your body. Potassium supplements may be needed if you have to use diuretics daily. Daily weights can help you keep track of your progress in clearing your edema. You should call your caregiver for follow up care as recommended. SEEK IMMEDIATE MEDICAL CARE IF:   You have increased swelling, pain, redness, or heat in your legs.  You develop shortness of breath, especially when lying down.  You develop chest or abdominal pain, weakness, or fainting.  You have a fever. Document Released: 09/20/2004 Document Revised: 11/05/2011 Document Reviewed: 08/31/2009 [facility] Patient Information 2014 [place].  I am setting up an apppointment to have your veins looked at-- after you remove your wraps (in 5 days)-- go to a medical supply store and obtain a compression stocking-- I will see you back if there are no problems with the veins and we can work on you from there.

## 2013-12-04 NOTE — Progress Notes (Signed)
   Subjective:    Patient ID: Donna Kidd, female    DOB: [birth year], 48 y.o.   MRN: [id number]  HPI Comments: "I have been swelling real bad in this left foot"  Patient c/o aching left foot that has been severely swollen for the past month. She has been seen by her PCP, went to ER at [facility], and now has an appointment with cardiology on 12-21-13. She has has bloodwork, ultrasound for clot, xrays-was told all was negative. She can't walk without limping, nor can she wear shoes. Only becoming more painful and swollen.     Review of Systems  Constitutional: Positive for activity change, fatigue and unexpected weight change.  Respiratory: Positive for shortness of breath.   Cardiovascular: Positive for leg swelling.  Musculoskeletal: Positive for gait problem.  All other systems reviewed and are negative.      Objective:   Physical Exam GENERAL APPEARANCE: Alert, conversant. Appropriately groomed. No acute distress.  VASCULAR: Pedal pulses unable to be palpated left due to edema.  Left foot and lower leg warm to touch compared to the right.  Significant swelling which is pitting in nature is present left in comparison to the right. Capillary refill time is immediate to all digits,  Proximal to distal cooling it warm to warm.  Digital hair growth is present bilateral  NEUROLOGIC: sensation is decreased epicritically and protectively to 5.07 monofilament at 2/5 sites bilateral.  Light touch is intact bilateral, vibratory sensation decreased bilateral, achilles tendon reflex is intact bilateral.  MUSCULOSKELETAL: pain with range of motion at the ankle joint is present due to the swelling.  Otherwise acceptable muscle strength, tone and stability bilateral.  Intrinsic muscluature intact bilateral.  Rectus appearance of foot and digits noted bilateral.   DERMATOLOGIC:  Moderate to severe swelling is present left in comparison to the right.  Calor to the foot is also present.  No preulcerative  lesions are seen, no interdigital maceration noted.  No open lesions present.  Digital nails are asymptomatic.      Assessment & Plan:  Edema, vs venous insufficiency   Plan: An Unna boot was applied at today's visit. A prescription for a compression stocking was also written and she will remove the Unna boot in 5 days and apply the stocking at that time. A prescription for hydrocodone was written as a 1 time prescription as she is in some pain at today's visit. I also wrote a prescription for Augmentin in case she is starting to get some cellulitis as she does have calor in her foot. recommended a referral to [facility] for venous insufficiency versus lymphedema. She has a cardiology consult which I recommended she keep. Can consider an MRI if all exams are normal. Also will consider a massage therapist through [facility] if necessary. Recommendations will be made based on the results of her testing and consults.

## 2013-12-04 NOTE — ED Provider Notes (Signed)
Medical screening examination/treatment/procedure(s) were performed by non-physician practitioner and as supervising physician I was immediately available for consultation/collaboration.   EKG Interpretation None       Nat Christen, MD 12/04/13 2004

## 2013-12-07 ENCOUNTER — Ambulatory Visit (HOSPITAL_COMMUNITY)
Admission: RE | Admit: 2013-12-07 | Discharge: 2013-12-07 | Disposition: A | Discharge status: Home / Self Care | Payer: BC Managed Care – PPO | Source: Ambulatory Visit | Attending: Vascular Surgery | Admitting: Vascular Surgery

## 2013-12-07 ENCOUNTER — Other Ambulatory Visit: Payer: Self-pay | Admitting: Vascular Surgery

## 2013-12-07 DIAGNOSIS — R609 Edema, unspecified: Secondary | ICD-10-CM

## 2013-12-08 ENCOUNTER — Telehealth: Payer: Self-pay | Admitting: *Deleted

## 2013-12-08 NOTE — Telephone Encounter (Signed)
Message copied by Lolita Rieger on Tue Dec 08, 2013  2:08 PM ------      Message from: Harriett Sine D      Created: Mon Dec 07, 2013 11:56 AM      Regarding: FW: vascular consult-- venous (not arterial)       Kinisha Soper,  You got this?!  :-) Marcy Siren      ----- Message -----         From: Trudie Buckler, DPM         Sent: 12/04/2013   5:38 PM           To: Andres Ege, RN, Davinia Riccardi      Subject: vascular consult-- venous (not arterial)                 Val,      I know you asked me about this but my answer was so quick I don't know if you got it-- She needs a vascular consult at [facility] for venous congestion/lymphedema eval and lab testing.            I forgot to tell you that she also needs an appointment anytime after 9:30 up till 1.              I'll enter a referral too and put the time info on it-- hopefully it'll get across            Thanks!       ------

## 2013-12-08 NOTE — Telephone Encounter (Signed)
Referral to [facility] for consultation was faxed.  I also called and left a message for the patient that we are referring her to [facility].

## 2013-12-08 NOTE — Telephone Encounter (Signed)
Someone called me about 20 minutes ago about a [facility] consult.  I already have one scheduled for the end of May.  My foot is really red, painful and swollen.  Can she give me any information about my study I had done yesterday?

## 2013-12-09 ENCOUNTER — Encounter: Payer: Self-pay | Admitting: Podiatrist

## 2013-12-16 NOTE — Telephone Encounter (Signed)
I attempted to call the patient to inform her that Dr. Valentina Lucks said the study done on 04/13 showed one of her veins on her right leg isn't functioning properly.  Otherwise all is okay.  Voicemail was not working properly when I tried to leave a message.

## 2013-12-21 ENCOUNTER — Encounter: Payer: Self-pay | Admitting: Cardiology

## 2013-12-21 ENCOUNTER — Ambulatory Visit (INDEPENDENT_AMBULATORY_CARE_PROVIDER_SITE_OTHER): Payer: BC Managed Care – PPO | Admitting: Cardiology

## 2013-12-21 VITALS — BP 160/87 | HR 87 | Ht 68.0 in | Wt 273.0 lb

## 2013-12-21 DIAGNOSIS — I509 Heart failure, unspecified: Secondary | ICD-10-CM

## 2013-12-21 DIAGNOSIS — E119 Type 2 diabetes mellitus without complications: Secondary | ICD-10-CM

## 2013-12-21 DIAGNOSIS — E785 Hyperlipidemia, unspecified: Secondary | ICD-10-CM

## 2013-12-21 DIAGNOSIS — R609 Edema, unspecified: Secondary | ICD-10-CM

## 2013-12-21 DIAGNOSIS — R600 Localized edema: Secondary | ICD-10-CM

## 2013-12-21 DIAGNOSIS — I10 Essential (primary) hypertension: Secondary | ICD-10-CM

## 2013-12-21 DIAGNOSIS — I872 Venous insufficiency (chronic) (peripheral): Secondary | ICD-10-CM

## 2013-12-21 MED ORDER — POTASSIUM CHLORIDE CRYS ER 20 MEQ PO TBCR: 20.0000 meq | EXTENDED_RELEASE_TABLET | Freq: Every day | ORAL | Status: DC

## 2013-12-21 MED ORDER — FUROSEMIDE 40 MG PO TABS: ORAL_TABLET | ORAL | Status: DC

## 2013-12-21 MED ORDER — ATORVASTATIN CALCIUM 40 MG PO TABS: 40.0000 mg | ORAL_TABLET | Freq: Every day | ORAL | Status: DC
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
## 2013-12-21 NOTE — Patient Instructions (Addendum)
STOP TAKING CHLORTHALIDONE   START TAKING LASIX 40 MG- ONE IN MORNING AND ONE IN AFTERNOON  START TAKING ATORVASTATIN 40 MG DAILY   START TAKING 21 MEQ OF POTASSIUM DAILY   Your physician has requested that you have an echocardiogram. Echocardiography is a painless test that uses sound waves to create images of your heart. It provides your doctor with information about the size and shape of your heart and how well your heart's chambers and valves are working. This procedure takes approximately one hour. There are no restrictions for this procedure.  Your physician recommends that you return for lab work BEFORE YOUR [place]  Your physician recommends that you schedule a follow-up appointment in: [place]

## 2014-01-05 ENCOUNTER — Encounter: Payer: BC Managed Care – PPO | Admitting: Vascular Surgery

## 2014-01-05 ENCOUNTER — Ambulatory Visit (HOSPITAL_COMMUNITY): Payer: BC Managed Care – PPO | Attending: Cardiology | Admitting: Radiology

## 2014-01-05 ENCOUNTER — Other Ambulatory Visit (INDEPENDENT_AMBULATORY_CARE_PROVIDER_SITE_OTHER): Payer: BC Managed Care – PPO

## 2014-01-05 DIAGNOSIS — I509 Heart failure, unspecified: Secondary | ICD-10-CM

## 2014-01-05 DIAGNOSIS — E785 Hyperlipidemia, unspecified: Secondary | ICD-10-CM

## 2014-01-05 DIAGNOSIS — I10 Essential (primary) hypertension: Secondary | ICD-10-CM

## 2014-01-05 DIAGNOSIS — E119 Type 2 diabetes mellitus without complications: Secondary | ICD-10-CM

## 2014-01-05 DIAGNOSIS — R609 Edema, unspecified: Secondary | ICD-10-CM

## 2014-01-05 LAB — COMPREHENSIVE METABOLIC PANEL
ALT: 12 U/L (ref 0–35)
AST: 22 U/L (ref 0–37)
Albumin: 3.4 g/dL — ABNORMAL LOW (ref 3.5–5.2)
Alkaline Phosphatase: 114 U/L (ref 39–117)
BUN: 14 mg/dL (ref 6–23)
CO2: 26 mEq/L (ref 19–32)
Calcium: 9.7 mg/dL (ref 8.4–10.5)
Chloride: 99 mEq/L (ref 96–112)
Creatinine, Ser: 0.8 mg/dL (ref 0.4–1.2)
GFR: 87.71 mL/min (ref >60.00)
Glucose, Bld: 277 mg/dL — ABNORMAL HIGH (ref 70–99)
Potassium: 4.9 mEq/L (ref 3.5–5.1)
Sodium: 135 mEq/L (ref 135–145)
Total Bilirubin: 0.6 mg/dL (ref 0.2–1.2)
Total Protein: 7.3 g/dL (ref 6.0–8.3)

## 2014-01-05 NOTE — Progress Notes (Signed)
Echocardiogram performed.  

## 2014-01-11 ENCOUNTER — Ambulatory Visit (INDEPENDENT_AMBULATORY_CARE_PROVIDER_SITE_OTHER): Payer: BC Managed Care – PPO | Admitting: Family Medicine

## 2014-01-11 ENCOUNTER — Ambulatory Visit: Payer: BC Managed Care – PPO

## 2014-01-11 VITALS — BP 174/80 | HR 91 | Temp 97.7°F | Resp 18 | Ht 67.0 in | Wt 267.0 lb

## 2014-01-11 DIAGNOSIS — L02619 Cutaneous abscess of unspecified foot: Secondary | ICD-10-CM

## 2014-01-11 DIAGNOSIS — E1149 Type 2 diabetes mellitus with other diabetic neurological complication: Secondary | ICD-10-CM

## 2014-01-11 DIAGNOSIS — M25579 Pain in unspecified ankle and joints of unspecified foot: Secondary | ICD-10-CM

## 2014-01-11 DIAGNOSIS — E11610 Type 2 diabetes mellitus with diabetic neuropathic arthropathy: Secondary | ICD-10-CM

## 2014-01-11 DIAGNOSIS — L03119 Cellulitis of unspecified part of limb: Secondary | ICD-10-CM

## 2014-01-11 LAB — POCT CBC
GRANULOCYTE PERCENT: 56.4 % (ref 37–80)
HCT, POC: 35.3 % — AB (ref 37.7–47.9)
HEMOGLOBIN: 11.2 g/dL — AB (ref 12.2–16.2)
Lymph, poc: 3.3 (ref 0.6–3.4)
MCH, POC: 27.3 pg (ref 27–31.2)
MCHC: 31.7 g/dL — AB (ref 31.8–35.4)
MCV: 85.9 fL (ref 80–97)
MID (CBC): 0.5 (ref 0–0.9)
MPV: 8.9 fL (ref 0–99.8)
PLATELET COUNT, POC: 382 10*3/uL (ref 142–424)
POC Granulocyte: 4.9 (ref 2–6.9)
POC LYMPH PERCENT: 37.5 %L (ref 10–50)
POC MID %: 6.1 % (ref 0–12)
RBC: 4.11 M/uL (ref 4.04–5.48)
RDW, POC: 14.9 %
WBC: 8.7 10*3/uL (ref 4.6–10.2)

## 2014-01-11 LAB — GLUCOSE, POCT (MANUAL RESULT ENTRY): POC Glucose: 339 mg/dl — AB (ref 70–99)
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
## 2014-01-12 ENCOUNTER — Ambulatory Visit (INDEPENDENT_AMBULATORY_CARE_PROVIDER_SITE_OTHER): Payer: BC Managed Care – PPO | Admitting: Podiatrist

## 2014-01-12 ENCOUNTER — Other Ambulatory Visit: Payer: Self-pay

## 2014-01-12 ENCOUNTER — Telehealth: Payer: Self-pay

## 2014-01-12 ENCOUNTER — Ambulatory Visit
Admission: RE | Admit: 2014-01-12 | Discharge: 2014-01-12 | Disposition: A | Discharge status: Home / Self Care | Payer: BC Managed Care – PPO | Source: Ambulatory Visit | Attending: Family Medicine | Admitting: Family Medicine

## 2014-01-12 ENCOUNTER — Encounter: Payer: Self-pay | Admitting: Podiatrist

## 2014-01-12 VITALS — BP 139/76 | HR 97 | Resp 18

## 2014-01-12 DIAGNOSIS — E1149 Type 2 diabetes mellitus with other diabetic neurological complication: Secondary | ICD-10-CM

## 2014-01-12 DIAGNOSIS — M25579 Pain in unspecified ankle and joints of unspecified foot: Secondary | ICD-10-CM

## 2014-01-12 DIAGNOSIS — E11610 Type 2 diabetes mellitus with diabetic neuropathic arthropathy: Secondary | ICD-10-CM

## 2014-01-12 LAB — C-REACTIVE PROTEIN: CRP: 2.6 mg/dL — ABNORMAL HIGH (ref <0.60)

## 2014-01-12 LAB — BASIC METABOLIC PANEL
BUN: 20 mg/dL (ref 6–23)
CALCIUM: 10.1 mg/dL (ref 8.4–10.5)
CO2: 20 mEq/L (ref 19–32)
Chloride: 95 mEq/L — ABNORMAL LOW (ref 96–112)
Creat: 0.76 mg/dL (ref 0.50–1.10)
GLUCOSE: 360 mg/dL — AB (ref 70–99)
Potassium: 4.7 mEq/L (ref 3.5–5.3)
SODIUM: 131 meq/L — AB (ref 135–145)

## 2014-01-12 LAB — URIC ACID: Uric Acid, Serum: 8.5 mg/dL — ABNORMAL HIGH (ref 2.4–7.0)

## 2014-01-12 LAB — SEDIMENTATION RATE: Sed Rate: 71 mm/hr — ABNORMAL HIGH (ref 0–22)

## 2014-01-12 MED ORDER — WHEELCHAIR MISC: Status: DC

## 2014-01-12 NOTE — Telephone Encounter (Signed)
Pt called w/list of 3 med supply companies that are covered on her ins. Ordered wheelchair through [employer] for delivery tomorrow morning. Wheelchair to be delivered to pt's work address. All info put on [employer] order and faxed w/Rx for wheelchair. Pt notified of status.

## 2014-01-12 NOTE — Patient Instructions (Addendum)
I will see you in 2 weeks-- unless.. You get into see Dr. Donnie Mesa at [facility] prior to that scheduled visit. We will get all of your images together and notes and send to him prior to your visit.  We will also send you a copy to take with you as well.  Stay off your foot-- wear a sneaker or tennis shoe on the right foot for stability.  Cast Care The purpose of a cast is to protect and immobilize an injured part of the body. This may be necessary after fractures, surgery, or other injuries. Splints are another form of immobilization; however, they are usually not as rigid as a cast, which accommodates swelling of the injury while still maintaining immobilization. Splints are typically used in the immediate post injury or postoperative period, before changing to a cast.  Before the rigid material of a cast is formed, a layer of padding is first applied to protect the injury. The rigid portion of a cast is created by wrapping gauze saturated with plaster of paris around the injury; alternatively, the cast may be made of fiberglass. During the period of immobilization, a cast may need to be changed multiple times. The length of immobilization is dependant on the severity of the injury and the time needed for healing. This period can vary from a couple weeks to many months. After a cast is created radiographs (x-rays) through the cast determine if a satisfactory alignment of the bones was achieved. Radiographs may also be used throughout the healing period to check for signs of bone healing.  CARE OF THE CAST   Allow the cast to dry and harden completely before applying any pressure to it.  Applying pressure too early may create a point of excessive pressure on the skin, which may increase the risk of forming an ulcer. Drying time depends on the type of cast, but may last as long as 24 hours.  When a cast gets wet, a soft area may appear. If this happens accidentally, return to the doctor's office, emergency  room, or outpatient surgical facility as soon as possible for repairs or changing of the cast.  Do not get sand in the cast.  Do not place anything inside the cast, this includes items intended to scratch an area of skin that itches. ITCHING INSIDE A CAST   It is very common for a person with a cast to experience an itching sensation under the cast.  Do not scratch any area under the cast even if the area is within reach. The skin under a cast in an environment of increased risk for injury.  Do not put anything in the cast.  If there is no wound, such as an incision from surgery, you may sprinkle cornstarch into the cast to relieve itching.  If a wound is present under the cast, consult your caregiver for pain medication or medication to reduce itching.  Using a hairdryer (on the cold setting) is a useful technique for reducing an itching sensation. CARE OF THE PATIENT IN A CAST It is important to elevate the body part that is in a cast to a level equal to or above that of the heart whenever possible. Elevating the injured body part may reduce the likelihood of swelling. Elevation of a leg in a cast may be achieved by resting the leg on a pillow when in bed and on a footstool or chair when sitting. For an arm in a cast, rest the arm on a pillow  placed on the chest.  No matter how well one follows the necessary precautions, excessive swelling may occur under the cast. Signs and symptoms of excessive swelling include:  Severe and persistent pain.  Change in color of the tissues beyond the end of the cast, such as a change to blue or gray under the nails of the fingers or toes.  Coldness of the tissues beyond the cast when the rest of the body is warm.  Numbness or complete loss of feeling in the skin beyond the cast.  Feeling of tightness under the cast after it dries.  Swelling of the tissue to a greater extent than was present before the cast was applied.  For a leg cast, inability  to raise the big toe. If any of these signs or symptoms occur, contact your caregiver or an emergency room as soon as possible for treatment.  Infection Inside a Cast On occasion, and injury may become infected during healing. The most important way to fight an infection is to detect it early, however, early detection may be difficult if the infected area is covered by a cast. Infection should be reported immediately to your caregiver. The following are common signs and symptoms of infection:   Foul smell.  Fever greater than 101 F (38.3 C) (may be accompanied by a general ill feeling)  Leakage of fluid through the cast.  Increasing pain or soreness of the skin under the cast. Bathing with a Cast Bathing is often a difficult task with a cast. The cast must be kept dry at all times, unless otherwise specified by your caregiver. If the cast is on a limb, such as your arm or leg, it is often easier to take a bath with the extremity in a cast propped up on the side of the tub or a chair, out of the water. If the cast is on the trunk of the body, you should take sponge baths until the cast is removed.  Document Released: 08/13/2005 Document Revised: 12/08/2012 Document Reviewed: 11/25/2008 [facility] Patient Information 2014 [place], [place].

## 2014-01-12 NOTE — Progress Notes (Signed)
I am here to get this cast on and my foot is still swelling and I went to see my family doctor who did xrays and orderd a CT scan.  All the bones in my foot are broken but 1.  Subjective: Patient presents today for continued swelling, heat, stabbing pain on her left foot. She states that she recently saw her family doctor last night who took followup x-rays and noticed that she had multiple broken bones on her left foot. She then ordered a CT scan which was done today and confirmed it to be diagnosis of Charcot foot deformity. Dr. Brigitte Pulse also ordered a wheelchair for her. She was told to be completely off of her foot however this is very difficult for her. She is using her crutches as best she can but still placing some weight on her foot when she walks..   Objective: Swelling is present on the left foot and lower leg today. Swelling is significantly increased from the last time I saw her in the office. Pedal pulses are intact at 2/4 DP and PT left. Capillary refill time is immediate. Mild calor is present but not significantly different from the right foot. No open lesions are present, no pus or pirulence is noted. Interdigital spaces are within normal limits. Digital nails are within normal limits. Neuropathy is also present left foot.   X-rays are compared to those that were taken back in April and show Charcot changes in the midfoot. CT scan also showed multiple fractures consistent with Charcot deformity of the left foot.  Assessment: Acute Charcot deformity left foot  Plan: I applied a below knee fiberglass cast at today's visit. She was instructed to stay completely off of this foot and not to bear weight. I also discussed the x-rays and CT findings with her and recommended a referral to a Charcot reconstruction specialist at [facility], Dr. Donnie Mesa.  I will set up the referral for her and send her imaging studies. I will schedule to see her back in 2 weeks for followup. At that time we will replace  the cast and the swelling should be improved at that time. She is given instructions on cast care and monitoring the circulation to her digits. She will call if any pain, increased swelling, or cause for concern arises. If she sees the Charcot reconstruction specialist at [facility] prior to her next visit she will call and let me know the plan for followup.

## 2014-01-13 ENCOUNTER — Ambulatory Visit: Payer: BC Managed Care – PPO | Admitting: Podiatry

## 2014-01-14 ENCOUNTER — Ambulatory Visit: Payer: BC Managed Care – PPO | Admitting: Podiatry

## 2014-01-14 ENCOUNTER — Telehealth: Payer: Self-pay | Admitting: *Deleted

## 2014-01-14 NOTE — Telephone Encounter (Signed)
Per Dr. Valentina Lucks, I called and referred patient to Dr. Prudy Feeler to treat for Charcot left foot.  Notes and demographics were faxed to [phone]. Appointment was scheduled for 02/09/2014 at 10:15am, I called and informed the patient of the appointment.  Dr. Erline Hau [address] [place], [place]

## 2014-01-15 ENCOUNTER — Encounter: Payer: Self-pay | Admitting: Vascular Surgery

## 2014-01-19 ENCOUNTER — Encounter: Payer: Self-pay | Admitting: Vascular Surgery

## 2014-01-19 ENCOUNTER — Ambulatory Visit (INDEPENDENT_AMBULATORY_CARE_PROVIDER_SITE_OTHER): Payer: BC Managed Care – PPO | Admitting: Vascular Surgery

## 2014-01-19 ENCOUNTER — Telehealth: Payer: Self-pay | Admitting: *Deleted

## 2014-01-19 VITALS — BP 155/73 | HR 91 | Ht 67.0 in | Wt 266.0 lb

## 2014-01-19 DIAGNOSIS — R609 Edema, unspecified: Secondary | ICD-10-CM | POA: Insufficient documentation
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
## 2014-01-19 NOTE — Telephone Encounter (Signed)
I called and left the patient a message of Dr. Lovena Le address and gave her the appointment date of 02/09/2014 at 10:15am.  Please call with any further questions or concerns.

## 2014-01-19 NOTE — Telephone Encounter (Signed)
Message copied by Lolita Rieger on Tue Jan 19, 2014 11:35 AM ------      Message from: Cleon Gustin      Created: Fri Jan 15, 2014  8:22 AM      Regarding: Referral?      Contact: [place] - pt stated she had a MSG on her answering machine yesterday about being referred to a Dr. Lovena Le office in [place] but the message cut off. I did not know if it was you that called her or not. If it was, she will be in at work at 9 am and asked for you to call her at work. Her work number is [phone]. Thanks. ------

## 2014-01-20 ENCOUNTER — Ambulatory Visit (INDEPENDENT_AMBULATORY_CARE_PROVIDER_SITE_OTHER): Payer: BC Managed Care – PPO | Admitting: Podiatrist

## 2014-01-20 ENCOUNTER — Encounter: Payer: Self-pay | Admitting: Podiatrist

## 2014-01-20 VITALS — BP 166/96 | HR 84 | Resp 12

## 2014-01-20 DIAGNOSIS — R609 Edema, unspecified: Secondary | ICD-10-CM

## 2014-01-20 DIAGNOSIS — E11610 Type 2 diabetes mellitus with diabetic neuropathic arthropathy: Secondary | ICD-10-CM

## 2014-01-20 DIAGNOSIS — E1149 Type 2 diabetes mellitus with other diabetic neurological complication: Secondary | ICD-10-CM

## 2014-01-20 NOTE — Patient Instructions (Signed)
Call if your cast is rubbing or is uncomfortable-- Dr. Prudy Feeler will see you to discuss options for getting your foot better quickly.  Let me know if you have any questions or concerns.

## 2014-01-22 ENCOUNTER — Encounter: Payer: Self-pay | Admitting: *Deleted

## 2014-01-25 ENCOUNTER — Ambulatory Visit (INDEPENDENT_AMBULATORY_CARE_PROVIDER_SITE_OTHER): Payer: BC Managed Care – PPO | Admitting: Cardiology

## 2014-01-25 ENCOUNTER — Encounter: Payer: Self-pay | Admitting: Cardiology

## 2014-01-25 VITALS — BP 154/85 | HR 79 | Ht 67.0 in | Wt 278.0 lb

## 2014-01-25 DIAGNOSIS — Z01818 Encounter for other preprocedural examination: Secondary | ICD-10-CM

## 2014-01-25 NOTE — Patient Instructions (Signed)
STOP LASIX (FUROSEMIDE)  Your physician has requested that you have a lexiscan myoview. For further information please visit [email or web address]. Please follow instruction sheet, as given. CALL [phone] TO SCHEDULE WILL NEED FASTING LIPID PANEL DAY OF LEXISCAN   FOLLOW UP AS NEEDED
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
## 2014-01-25 NOTE — Progress Notes (Signed)
Donna Kidd presents today for a cast change.  States her foot is still swollen and the cast has gotten more loose over the last week of wear.  She saw Dr. Kellie Kidd and she does have a compromised vein in her right leg.  He advised her to heal from her left foot issues and to call if she would like the vein fixed in the future.  No changes reported since her last visit.  Swelling of the left foot still present.  Plantar lateral has a rocker bottom deformity present.  The cast appears to be worn and she does look to be placing some weight on the cast.  It is not completely broken down but does show signs of wear.  Pedal pulses palpable and sensation intact to pain but decreased overall  Assessment:  Charcot foot -- acute phase left  Plan:  Re applied a cast today and it was a bit more conforming and tighter for her foot.  She stated it feels OK.  She will be seing Dr. Prudy Kidd for possible charcot consult and possible interventions.  I will follow up if she would like, otherwise Dr. Prudy Kidd will take over care.  She will call if the cast feels too tight and it can be bi valved.

## 2014-01-27 ENCOUNTER — Ambulatory Visit (INDEPENDENT_AMBULATORY_CARE_PROVIDER_SITE_OTHER): Payer: BC Managed Care – PPO | Admitting: *Deleted

## 2014-01-27 ENCOUNTER — Telehealth: Payer: Self-pay | Admitting: *Deleted

## 2014-01-27 DIAGNOSIS — E1149 Type 2 diabetes mellitus with other diabetic neurological complication: Secondary | ICD-10-CM

## 2014-01-27 DIAGNOSIS — E11610 Type 2 diabetes mellitus with diabetic neuropathic arthropathy: Secondary | ICD-10-CM

## 2014-01-27 NOTE — Telephone Encounter (Signed)
Diagnosed with Charcot Foot.  I have a cast on left foot.  I didn't get my shower cover on as good as I should have.  I got my cast a little wet.  I feel some moisture on the inside.  I'd like someone to look at it and replace my cast.  I returned her call.  I asked her if she can come in this afternoon.  She stated yes.  I informed her she will be seeing Donna Kidd, one of the assistants, because Dr. Valentina Lucks is not in the office this week.  She stated okay that is fine.

## 2014-01-27 NOTE — Progress Notes (Signed)
Patient called today stating that she got her cast wet in the shower today. Patient presents to the office for cast change. I removed the cast and reapplied. Patient has a follow up appointment with Dr. Valentina Lucks on June 12th.

## 2014-02-05 ENCOUNTER — Encounter: Payer: Self-pay | Admitting: Podiatrist

## 2014-02-05 ENCOUNTER — Telehealth: Payer: Self-pay | Admitting: *Deleted

## 2014-02-05 ENCOUNTER — Ambulatory Visit (INDEPENDENT_AMBULATORY_CARE_PROVIDER_SITE_OTHER): Payer: BC Managed Care – PPO

## 2014-02-05 ENCOUNTER — Ambulatory Visit (INDEPENDENT_AMBULATORY_CARE_PROVIDER_SITE_OTHER): Payer: BC Managed Care – PPO | Admitting: Podiatrist

## 2014-02-05 VITALS — BP 152/86 | HR 95 | Resp 18

## 2014-02-05 DIAGNOSIS — S90129A Contusion of unspecified lesser toe(s) without damage to nail, initial encounter: Secondary | ICD-10-CM

## 2014-02-05 DIAGNOSIS — R52 Pain, unspecified: Secondary | ICD-10-CM

## 2014-02-05 DIAGNOSIS — E11610 Type 2 diabetes mellitus with diabetic neuropathic arthropathy: Secondary | ICD-10-CM

## 2014-02-05 DIAGNOSIS — E1149 Type 2 diabetes mellitus with other diabetic neurological complication: Secondary | ICD-10-CM

## 2014-02-05 MED ORDER — HYDROCODONE-ACETAMINOPHEN 5-325 MG PO TABS: ORAL_TABLET | ORAL | Status: DC

## 2014-02-05 NOTE — Progress Notes (Signed)
I AM HERE TO GET MY CAST CHECKED ON MY LEFT FOOT AND IT IS RUBBING AGAINST MY TOES AND I THINK I MAY HAVE BROKEN THE RIGHT BIG TOE AND ONE ON MY LEFT FOOT  Subjective: Donna Kidd presents today for cast change on her left foot. She states it's rubbing against her toes and causing small sores. She's also concerned she may have broken the right fifth toe.  Objective: The cast does appear to be thin at the distal tip and she does appear to be bearing weight on the cast causing her toes to dorsiflex and hit the top of the casting material. There are very small excoriations on the tops of all her digits of the left foot. No sign of infection is present. Right fifth toe is also red and swollen. X-rays are taken and no obvious fracture is seen. Swelling has decreased significantly on the left foot once the cast is removed. The left foot is still swollen and hot and pulses are palpable today at 3+ out of 4 left. Neurological sensation is still decreased.  Assessment: Charcot deformity left foot: Contusion right fifth toe  Plan: Her cast was changed on the left foot today being sure to add extra padding at the forefoot and toes. The fifth toe contusion will need no treatment. She is instructed to wear good supportive tennis shoes. She is to see Dr. Prudy Feeler next week in [place]. I will set her up for followup however she is instructed to call and cancel when he takes over her care. She will call if any questions, or concerns arise or if she needs help with the cast.

## 2014-02-05 NOTE — Telephone Encounter (Signed)
I was supposed to get a DVD to take to Dr. Lovena Le office.  I have an appointment on the 16th.  I forgot to get that.  Let me know when it's ready.  I returned her call and informed her she can come by to pick up the CD of her x-rays.  She said she would come by on Monday to pick it up.

## 2014-02-11 ENCOUNTER — Encounter: Payer: Self-pay | Admitting: Family Medicine

## 2014-02-11 ENCOUNTER — Telehealth: Payer: Self-pay | Admitting: Family Medicine

## 2014-02-11 MED ORDER — INSULIN ASPART 100 UNIT/ML FLEXPEN: PEN_INJECTOR | SUBCUTANEOUS | Status: DC

## 2014-02-11 NOTE — Telephone Encounter (Signed)
Pt called - she needs a rx for a transfer shower bench - (the kind that goes OVER a tub so she can sit down and transfer herself into the shower) sent to her medical supply store - whoever provided her wheelchair would be great.  Refill on her insulin sent it. Papers for her surgical clearance received - they do ask for a repeat exam and other things so if pt could come in to clinic Saturday we could make sure to get it done.  If she can't come in Saturday I will be willing to sign the papers still but can't guarentee her sugeon will accept them as will have to post-date them to her last exam I did which was 01/11/14. Pt not available at home # - contact at either work or cell.

## 2014-02-13 ENCOUNTER — Ambulatory Visit (INDEPENDENT_AMBULATORY_CARE_PROVIDER_SITE_OTHER): Payer: BC Managed Care – PPO | Admitting: Family Medicine

## 2014-02-13 VITALS — BP 150/74 | HR 101 | Temp 98.7°F | Resp 18 | Ht 67.0 in | Wt 267.0 lb

## 2014-02-13 DIAGNOSIS — Z79899 Other long term (current) drug therapy: Secondary | ICD-10-CM

## 2014-02-13 DIAGNOSIS — IMO0002 Reserved for concepts with insufficient information to code with codable children: Secondary | ICD-10-CM

## 2014-02-13 DIAGNOSIS — E1165 Type 2 diabetes mellitus with hyperglycemia: Principal | ICD-10-CM

## 2014-02-13 DIAGNOSIS — E11610 Type 2 diabetes mellitus with diabetic neuropathic arthropathy: Secondary | ICD-10-CM

## 2014-02-13 DIAGNOSIS — E1140 Type 2 diabetes mellitus with diabetic neuropathy, unspecified: Secondary | ICD-10-CM

## 2014-02-13 DIAGNOSIS — E785 Hyperlipidemia, unspecified: Secondary | ICD-10-CM

## 2014-02-13 DIAGNOSIS — Z789 Other specified health status: Secondary | ICD-10-CM

## 2014-02-13 DIAGNOSIS — E1149 Type 2 diabetes mellitus with other diabetic neurological complication: Secondary | ICD-10-CM

## 2014-02-13 DIAGNOSIS — I10 Essential (primary) hypertension: Secondary | ICD-10-CM

## 2014-02-13 DIAGNOSIS — R2689 Other abnormalities of gait and mobility: Secondary | ICD-10-CM

## 2014-02-13 DIAGNOSIS — E1142 Type 2 diabetes mellitus with diabetic polyneuropathy: Secondary | ICD-10-CM

## 2014-02-13 DIAGNOSIS — Z01818 Encounter for other preprocedural examination: Secondary | ICD-10-CM

## 2014-02-13 LAB — POCT URINALYSIS DIPSTICK
Bilirubin, UA: NEGATIVE
Blood, UA: NEGATIVE
Glucose, UA: 100
Ketones, UA: NEGATIVE
LEUKOCYTES UA: NEGATIVE
Nitrite, UA: NEGATIVE
PROTEIN UA: 30
Spec Grav, UA: 1.015
Urobilinogen, UA: 0.2
pH, UA: 5

## 2014-02-13 LAB — POCT UA - MICROSCOPIC ONLY
Bacteria, U Microscopic: NEGATIVE
CRYSTALS, UR, HPF, POC: NEGATIVE
Casts, Ur, LPF, POC: NEGATIVE
Mucus, UA: NEGATIVE
RBC, urine, microscopic: NEGATIVE
Yeast, UA: NEGATIVE

## 2014-02-13 LAB — COMPREHENSIVE METABOLIC PANEL
ALT: 11 U/L (ref 0–35)
AST: 14 U/L (ref 0–37)
Albumin: 4.3 g/dL (ref 3.5–5.2)
Alkaline Phosphatase: 100 U/L (ref 39–117)
BILIRUBIN TOTAL: 0.2 mg/dL (ref 0.2–1.2)
BUN: 19 mg/dL (ref 6–23)
CALCIUM: 10.1 mg/dL (ref 8.4–10.5)
CHLORIDE: 100 meq/L (ref 96–112)
CO2: 24 mEq/L (ref 19–32)
Creat: 0.79 mg/dL (ref 0.50–1.10)
Glucose, Bld: 89 mg/dL (ref 70–99)
Potassium: 4.6 mEq/L (ref 3.5–5.3)
SODIUM: 136 meq/L (ref 135–145)
Total Protein: 7.4 g/dL (ref 6.0–8.3)

## 2014-02-13 LAB — POCT GLYCOSYLATED HEMOGLOBIN (HGB A1C): HEMOGLOBIN A1C: 9
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
## 2014-02-13 NOTE — Patient Instructions (Signed)
Pain Relief Preoperatively and Postoperatively °Being a good patient does not mean being a silent one. If you have questions, problems, or concerns about the pain you may feel after surgery, let your caregiver know. Patients have the right to assessment and management of pain. The treatment of pain after surgery is important to speed up recovery and return to normal activities. Severe pain after surgery, and the fear or anxiety associated with that pain, may cause extreme discomfort that: °· Prevents sleep. °· Decreases the ability to breathe deeply and cough. This can cause pneumonia or other upper airway infections. °· Causes your heart to beat faster and your blood pressure to be higher. °· Increases the risk for constipation and bloating. °· Decreases the ability of wounds to heal. °· May result in depression, increased anxiety, and feelings of helplessness. °Relief of pain before surgery is also important because it will lessen the pain after surgery. Patients who receive both pain relief before and after surgery experience greater pain relief than those who only receive pain relief after surgery. Let your caregiver know if you are having uncontrolled pain. This is very important. Pain after surgery is more difficult to manage if it is permitted to become severe, so prompt and adequate treatment of acute pain is necessary. °PAIN CONTROL METHODS °Your caregivers follow policies and procedures about the management of patient pain. These guidelines should be explained to you before surgery. Plans for pain control after surgery must be mutually decided upon and instituted with your full understanding and agreement. Do not be afraid to ask questions regarding the care you are receiving. There are many different ways your caregivers will attempt to control your pain, including the following methods. °As needed pain control °· You may be given pain medicine either through your intravenous (IV) tube, or as a pill or  liquid you can swallow. You will need to let your caregiver know when you are having pain. Then, your caregiver will give you the pain medicine ordered for you. °· Your pain medicine may make you constipated. If constipation occurs, drink more liquids if you can. Your caregiver may have you take a mild laxative. °IV patient-controlled analgesia pump (PCA pump) °· You can get your pain medicine through the IV tube which goes into your vein. You are able to control the amount of pain medicine that you get. The pain medicine flows in through an IV tube and is controlled by a pump. This pump gives you a set amount of pain medicine when you push the button hooked up to it. Nobody should push this button but you or someone specifically assigned by you to do so. It is set up to keep you from accidentally giving yourself too much pain medicine. You will be able to start using your pain pump in the recovery room after your surgery. This method can be helpful for most types of surgery. °· If you are still having too much pain, tell your caregiver. Also, tell your caregiver if you are feeling too sleepy or nauseous. °Continuous epidural pain control °· A thin, soft tube (catheter) is put into your back. Pain medicine flows through the catheter to lessen pain in the part of your body where the surgery is done. Continuous epidural pain control may work best for you if you are having surgery on your chest, abdomen, hip area, or legs. The epidural catheter is usually put into your back just before surgery. The catheter is left in until you can eat and take medicine by mouth. In most cases,   this may take 2 to 3 days. °· Giving pain medicine through the epidural catheter may help you heal faster because: °¨ Your bowel gets back to normal faster. °¨ You can get back to eating sooner. °¨ You can be up and walking sooner. °Medicine that numbs the area (local anesthetic) °· You may receive an injection of pain medicine near where the  pain is (local infiltration). °· You may receive an injection of pain medicine near the nerve that controls the sensation to a specific part of the body (peripheral nerve block). °· Medicine may be put in the spine to block pain (spinal block). °Opioids °· Moderate to moderately severe acute pain after surgery may respond to opioids. Opioids are narcotic pain medicine. Opioids are often combined with non-narcotic medicines to improve pain relief, diminish the risk of side effects, and reduce the chance of addiction. °· If you follow your caregiver's directions about taking opioids and you do not have a history of substance abuse, your risk of becoming addicted is exceptionally small. Opioids are given for short periods of time in careful doses to prevent addiction. °Other methods of pain control include: °· Steroids. °· Physical therapy. °· Heat and cold therapy. °· Compression, such as wrapping an elastic bandage around the area of pain. °· Massage. °These various ways of controlling pain may be used together. Combining different methods of pain control is called multimodal analgesia. Using this approach has many benefits, including being able to eat, move around, and leave the hospital sooner. °Document Released: 11/03/2002 Document Revised: 11/05/2011 Document Reviewed: 11/07/2010 °Exit[employer]® Patient Information ©2015 [employer]. This information is not intended to replace advice given to you by your health care provider. Make sure you discuss any questions you have with your health care provider. ° °

## 2014-02-17 MED ORDER — CARVEDILOL 6.25 MG PO TABS: 6.2500 mg | ORAL_TABLET | Freq: Two times a day (BID) | ORAL | Status: DC

## 2014-02-17 NOTE — Telephone Encounter (Signed)
I saw pt on Sat 6/20 for a preop surgical clearance.  Form was completed at that visit and faxed back to foot surgeon in [place] at that visit ([facility]). Copy of form was sent to scan and original given back to pt (I think).  Pt is VERy concerned because surgeon is now refusing to proceed as BP and DM is uncontrolled - however, pt needs surgery quickly before her bones in her charcot foot fuse back together.    Surgical clearance form needs to be addended with the following:  We will start pt on carvedilol for her BP - sent to pharmacy - have pt call in 1 wk with her BP for the past 3 d - check daily.  Also, pt's DM has been stable for some time - her a1c is actually improved from prior on 9.8 in December.  Her cbgs are very labile and so I do not think that she will be able to get any significant improvement in her DM quickly - will likely take months to years of insulin adjustment for this. In addition, her a1c has remained at that level for a long time so decreased her cbgs to normal range could put her body into relative hypoglyclemia and cause her to be more symptomatic.  In addition, pt has an appt to f/u w/ her endocrinologist Dr. Loanne Drilling next week.  Will copy this onto the bottom of my note on Sat so if you could please fax that over to Dr. Prudy Feeler in [place] and ask their office to call with any additional concerns that would be great.  I will be back in the office at 3 p.m. On Mon 6/29.

## 2014-02-17 NOTE — Telephone Encounter (Signed)
Pt called. She is looking for surgical clearance paperwork that was suppose to be sent to the surgeon. I did not see this paperwork or see notes in regards to where it is located. Please advise. I will send to them or fax as needed.

## 2014-02-18 NOTE — Telephone Encounter (Signed)
Printed addedum and faxed to Dr. Judy Pimple office. [phone]) I have a copy in the nurses inbox to refax if necessary.

## 2014-02-18 NOTE — Telephone Encounter (Signed)
Spoke to pt, she is aware.   She is also aware of instructions for beginning carvedilol.

## 2014-02-19 NOTE — Telephone Encounter (Signed)
Thank you so much for your great nursing support.

## 2014-02-24 ENCOUNTER — Ambulatory Visit: Payer: BC Managed Care – PPO | Admitting: Podiatrist

## 2014-03-01 ENCOUNTER — Ambulatory Visit (INDEPENDENT_AMBULATORY_CARE_PROVIDER_SITE_OTHER): Payer: BC Managed Care – PPO | Admitting: Endocrinology

## 2014-03-01 ENCOUNTER — Encounter: Payer: Self-pay | Admitting: Endocrinology

## 2014-03-01 VITALS — BP 136/76 | HR 81 | Temp 98.6°F | Ht 67.0 in | Wt 282.0 lb

## 2014-03-01 DIAGNOSIS — E1140 Type 2 diabetes mellitus with diabetic neuropathy, unspecified: Secondary | ICD-10-CM

## 2014-03-01 DIAGNOSIS — E1142 Type 2 diabetes mellitus with diabetic polyneuropathy: Secondary | ICD-10-CM

## 2014-03-01 DIAGNOSIS — E1149 Type 2 diabetes mellitus with other diabetic neurological complication: Secondary | ICD-10-CM

## 2014-03-01 DIAGNOSIS — IMO0002 Reserved for concepts with insufficient information to code with codable children: Secondary | ICD-10-CM

## 2014-03-01 DIAGNOSIS — E1165 Type 2 diabetes mellitus with hyperglycemia: Principal | ICD-10-CM

## 2014-03-01 LAB — MICROALBUMIN / CREATININE URINE RATIO
Creatinine,U: 65.2 mg/dL
MICROALB/CREAT RATIO: 4.4 mg/g (ref 0.0–30.0)
Microalb, Ur: 2.9 mg/dL — ABNORMAL HIGH (ref 0.0–1.9)
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
## 2014-03-01 NOTE — Patient Instructions (Addendum)
check your blood sugar twice a day.  vary the time of day when you check, between before the 3 meals, and at bedtime.  also check if you have symptoms of your blood sugar being too high or too low.  please keep a record of the readings and bring it to your next appointment here.  please call us sooner if your blood sugar goes below 70, or if you have a lot of readings over 200. Please increase novolog, and decrease lantus, as below.  Please come back for a follow-up appointment in 3 months. Blood and urine tests are being requested for you today.  We'll contact you with results.

## 2014-03-03 LAB — FRUCTOSAMINE: Fructosamine: 259 umol/L (ref 190–270)

## 2014-03-04 ENCOUNTER — Encounter: Payer: Self-pay | Admitting: Endocrinology

## 2014-03-04 ENCOUNTER — Encounter: Payer: Self-pay | Admitting: Family Medicine

## 2014-03-04 ENCOUNTER — Telehealth: Payer: Self-pay | Admitting: Endocrinology

## 2014-03-04 NOTE — Telephone Encounter (Signed)
Requested call back to discuss.  

## 2014-03-04 NOTE — Telephone Encounter (Signed)
Called and spoke with pt. She states that she is going to need a surgery clearance letter for Dr. Prudy Feeler at [facility] [place].  Please advise, Thanks!

## 2014-03-04 NOTE — Telephone Encounter (Signed)
[place], i sent to dr vogler.

## 2014-03-04 NOTE — Telephone Encounter (Signed)
Patient would like to speak with someone regarding her labs as she does not understand them on mychart She needs clearence for surgery   Thank You

## 2014-03-04 NOTE — Telephone Encounter (Signed)
Note, pt advised.

## 2014-03-16 ENCOUNTER — Telehealth: Payer: Self-pay | Admitting: *Deleted

## 2014-03-16 ENCOUNTER — Other Ambulatory Visit: Payer: Self-pay

## 2014-03-16 ENCOUNTER — Other Ambulatory Visit: Payer: Self-pay | Admitting: Family Medicine

## 2014-03-16 ENCOUNTER — Other Ambulatory Visit: Payer: Self-pay | Admitting: Podiatrist

## 2014-03-16 ENCOUNTER — Encounter: Payer: Self-pay | Admitting: Endocrinology

## 2014-03-16 MED ORDER — INSULIN ASPART 100 UNIT/ML FLEXPEN: PEN_INJECTOR | SUBCUTANEOUS | Status: DC

## 2014-03-16 MED ORDER — HYDROCODONE-ACETAMINOPHEN 5-325 MG PO TABS: ORAL_TABLET | ORAL | Status: DC

## 2014-03-16 NOTE — Telephone Encounter (Signed)
I called and informed the patient that she can come by the office to pick up her prescription for pain medication.  She stated I wasn't asking Dr. Valentina Lucks, I can ask Dr. Brigitte Pulse if she likes.  I told her it's fine, Dr. Valentina Lucks said you could have a refill.  She stated she would come by tomorrow to pick it up,

## 2014-03-17 MED ORDER — CARVEDILOL 6.25 MG PO TABS: 6.2500 mg | ORAL_TABLET | Freq: Two times a day (BID) | ORAL | Status: DC

## 2014-03-17 NOTE — Telephone Encounter (Signed)
Dr Brigitte Pulse, per your inst's to pt on email, OKd RFs of carvedilol at current dosage. I'm routing this to you for you to see pt message to you.

## 2014-04-20 ENCOUNTER — Telehealth: Payer: Self-pay

## 2014-04-20 DIAGNOSIS — E11610 Type 2 diabetes mellitus with diabetic neuropathic arthropathy: Secondary | ICD-10-CM | POA: Insufficient documentation

## 2014-04-20 NOTE — Telephone Encounter (Signed)
Patient wants to know where Dr. Brigitte Pulse ordered her wheel chair.  She is being released from the Hospital in one hour and needs to have this information.  ASAP.   [facility] room  [phone] cell phone

## 2014-04-20 NOTE — Telephone Encounter (Signed)
Spoke to pt- she will be discharged today and the hospital is going to set her up with the wheel chair and appliances for home. Advised pt to call us if she needs anything further from [place].

## 2014-04-22 ENCOUNTER — Telehealth: Payer: Self-pay

## 2014-04-22 NOTE — Telephone Encounter (Signed)
Diabetic Bundle. Lvom for pt to call back and schedule appointment with Dr. Ellison.  

## 2014-04-25 ENCOUNTER — Ambulatory Visit (INDEPENDENT_AMBULATORY_CARE_PROVIDER_SITE_OTHER): Payer: BC Managed Care – PPO | Admitting: Internal Medicine

## 2014-04-25 VITALS — BP 130/84 | HR 80 | Temp 98.5°F | Resp 18

## 2014-04-25 DIAGNOSIS — N390 Urinary tract infection, site not specified: Secondary | ICD-10-CM

## 2014-04-25 DIAGNOSIS — R300 Dysuria: Secondary | ICD-10-CM

## 2014-04-25 DIAGNOSIS — R309 Painful micturition, unspecified: Secondary | ICD-10-CM

## 2014-04-25 DIAGNOSIS — R350 Frequency of micturition: Secondary | ICD-10-CM

## 2014-04-25 LAB — POCT UA - MICROSCOPIC ONLY
CASTS, UR, LPF, POC: NEGATIVE
Crystals, Ur, HPF, POC: NEGATIVE
MUCUS UA: NEGATIVE
Yeast, UA: NEGATIVE

## 2014-04-25 LAB — POCT URINALYSIS DIPSTICK
BILIRUBIN UA: NEGATIVE
Glucose, UA: >=1000
Ketones, UA: NEGATIVE
NITRITE UA: NEGATIVE
PH UA: 6
Spec Grav, UA: 1.01
Urobilinogen, UA: 0.2

## 2014-04-25 MED ORDER — CIPROFLOXACIN HCL 500 MG PO TABS: 500.0000 mg | ORAL_TABLET | Freq: Two times a day (BID) | ORAL | Status: DC

## 2014-04-25 MED ORDER — PHENAZOPYRIDINE HCL 200 MG PO TABS: 200.0000 mg | ORAL_TABLET | Freq: Three times a day (TID) | ORAL | Status: DC | PRN

## 2014-04-25 MED ORDER — PHENAZOPYRIDINE HCL 100 MG PO TABS
200.0000 mg | ORAL_TABLET | Freq: Once | ORAL | Status: AC
  Administered 2014-04-25: 200 mg via ORAL

## 2014-04-25 NOTE — Progress Notes (Signed)
   Subjective:    Patient ID: Donna Kidd, female    DOB: 1965/10/12, 48 y.o.   MRN: [id number]  HPI  Chief Complaint  Patient presents with  . Dysuria    since wednesday   . Urinary Frequency  . burning with urination   no fever or abdominal pain No flank pain   Recent surgery for Charcot joint T. [facility] foot Review of Systems Noncontributory    Objective:   Physical Exam  BP 130/84  Pulse 80  Temp(Src) 98.5 F (36.9 C) (Oral)  Resp 18  SpO2 99%  LMP 01/14/2014 No acute distress No flank pain to percussion No abdominal tenderness  Results for orders placed in visit on 04/25/14  POCT URINALYSIS DIPSTICK      Result Value Ref Range   Color, UA yellow     Clarity, UA hazy     Glucose, UA >=1000     Bilirubin, UA neg     Ketones, UA neg     Spec Grav, UA 1.010     Blood, UA large     pH, UA 6.0     Protein, UA 3+     Urobilinogen, UA 0.2     Nitrite, UA neg     Leukocytes, UA Trace    POCT UA - MICROSCOPIC ONLY      Result Value Ref Range   WBC, Ur, HPF, POC 15-20     RBC, urine, microscopic 10-15     Bacteria, U Microscopic trace     Mucus, UA neg     Epithelial cells, urine per micros 1-3     Crystals, Ur, HPF, POC neg     Casts, Ur, LPF, POC neg     Yeast, UA neg           Assessment & Plan:  Frequent urination - Plan: POCT urinalysis dipstick, POCT UA - Microscopic Only  Pain with urination - Plan: POCT urinalysis dipstick, POCT UA - Microscopic Only  Urinary tract infection, site not specified - Plan: Urine culture  Meds ordered this encounter  Medications  . phenazopyridine (PYRIDIUM) 200 MG tablet    Sig: Take 1 tablet (200 mg total) by mouth 3 (three) times daily as needed for pain.    Dispense:  10 tablet    Refill:  0  . ciprofloxacin (CIPRO) 500 MG tablet    Sig: Take 1 tablet (500 mg total) by mouth 2 (two) times daily.    Dispense:  20 tablet    Refill:  0

## 2014-04-28 LAB — URINE CULTURE: Colony Count: 30000

## 2014-05-31 ENCOUNTER — Ambulatory Visit (INDEPENDENT_AMBULATORY_CARE_PROVIDER_SITE_OTHER): Payer: BC Managed Care – PPO | Admitting: Endocrinology

## 2014-05-31 ENCOUNTER — Encounter: Payer: Self-pay | Admitting: Endocrinology

## 2014-05-31 VITALS — BP 126/64 | HR 88 | Temp 98.3°F | Ht 67.0 in

## 2014-05-31 DIAGNOSIS — IMO0002 Reserved for concepts with insufficient information to code with codable children: Secondary | ICD-10-CM

## 2014-05-31 DIAGNOSIS — E1140 Type 2 diabetes mellitus with diabetic neuropathy, unspecified: Secondary | ICD-10-CM

## 2014-05-31 DIAGNOSIS — E1165 Type 2 diabetes mellitus with hyperglycemia: Secondary | ICD-10-CM

## 2014-05-31 LAB — HEMOGLOBIN A1C: Hgb A1c MFr Bld: 7.9 % — ABNORMAL HIGH (ref 4.6–6.5)

## 2014-05-31 NOTE — Patient Instructions (Addendum)
check your blood sugar twice a day.  vary the time of day when you check, between before the 3 meals, and at bedtime.  also check if you have symptoms of your blood sugar being too high or too low.  please keep a record of the readings and bring it to your next appointment here.  please call us sooner if your blood sugar goes below 70, or if you have a lot of readings over 200.    Please come back for a follow-up appointment in 3 months.  blood tests are being requested for you today.  We'll contact you with results.     
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
## 2014-06-08 ENCOUNTER — Telehealth: Payer: Self-pay | Admitting: Endocrinology

## 2014-06-08 MED ORDER — INSULIN ASPART 100 UNIT/ML FLEXPEN: PEN_INJECTOR | SUBCUTANEOUS | Status: DC

## 2014-06-08 NOTE — Telephone Encounter (Signed)
Pt needs novolog flex pen called in please to [facility] 60/40/90 u with meals

## 2014-06-08 NOTE — Telephone Encounter (Signed)
Rx sent to pharmacy   

## 2014-06-11 ENCOUNTER — Other Ambulatory Visit: Payer: Self-pay

## 2014-08-16 ENCOUNTER — Telehealth: Payer: Self-pay | Admitting: Endocrinology

## 2014-08-16 MED ORDER — INSULIN ASPART 100 UNIT/ML FLEXPEN: PEN_INJECTOR | SUBCUTANEOUS | Status: DC

## 2014-08-16 NOTE — Telephone Encounter (Signed)
Rx sent to pharmacy   

## 2014-08-16 NOTE — Telephone Encounter (Signed)
Patient need refill of Novalog flex pen for about a month.

## 2014-08-19 ENCOUNTER — Telehealth: Payer: Self-pay | Admitting: Endocrinology

## 2014-08-19 NOTE — Telephone Encounter (Signed)
This is Dr. Cordelia Pen pt. My mistake.  Pt needs refill on her insulin she will be out of her meds she will be out on Sunday.  Looked in pt's chart megan called in rx on 08/16/14. Pt aware and will call the pharmacy

## 2014-09-02 ENCOUNTER — Encounter: Payer: Self-pay | Admitting: Endocrinology

## 2014-09-02 ENCOUNTER — Ambulatory Visit (INDEPENDENT_AMBULATORY_CARE_PROVIDER_SITE_OTHER): Payer: BC Managed Care – PPO | Admitting: Endocrinology

## 2014-09-02 VITALS — BP 132/82 | HR 88 | Temp 98.0°F | Ht 67.0 in

## 2014-09-02 DIAGNOSIS — E1165 Type 2 diabetes mellitus with hyperglycemia: Secondary | ICD-10-CM

## 2014-09-02 DIAGNOSIS — IMO0002 Reserved for concepts with insufficient information to code with codable children: Secondary | ICD-10-CM

## 2014-09-02 DIAGNOSIS — E1140 Type 2 diabetes mellitus with diabetic neuropathy, unspecified: Secondary | ICD-10-CM

## 2014-09-02 LAB — LIPID PANEL
Cholesterol: 186 mg/dL (ref 0–200)
HDL: 37.9 mg/dL — ABNORMAL LOW (ref >39.00)
NONHDL: 148.1
Total CHOL/HDL Ratio: 5
Triglycerides: 373 mg/dL — ABNORMAL HIGH (ref 0.0–149.0)
VLDL: 74.6 mg/dL — ABNORMAL HIGH (ref 0.0–40.0)

## 2014-09-02 LAB — HEMOGLOBIN A1C: Hgb A1c MFr Bld: 8.5 % — ABNORMAL HIGH (ref 4.6–6.5)

## 2014-09-02 LAB — LDL CHOLESTEROL, DIRECT: Direct LDL: 94.9 mg/dL

## 2014-09-02 MED ORDER — INSULIN LISPRO 100 UNIT/ML ~~LOC~~ SOLN: SUBCUTANEOUS | Status: DC
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
## 2014-09-02 NOTE — Patient Instructions (Addendum)
check your blood sugar twice a day.  vary the time of day when you check, between before the 3 meals, and at bedtime.  also check if you have symptoms of your blood sugar being too high or too low.  please keep a record of the readings and bring it to your next appointment here.  please call us sooner if your blood sugar goes below 70, or if you have a lot of readings over 200. Please come back for a follow-up appointment in 3 months.   blood tests are being requested for you today.  We'll contact you with results.   Based on the results, we'll change the novolog to the right amount of humalog.

## 2014-09-05 ENCOUNTER — Encounter: Payer: Self-pay | Admitting: Family Medicine

## 2014-09-05 ENCOUNTER — Encounter: Payer: Self-pay | Admitting: Endocrinology

## 2014-09-24 ENCOUNTER — Other Ambulatory Visit: Payer: Self-pay

## 2014-09-24 MED ORDER — INSULIN LISPRO 100 UNIT/ML (KWIKPEN): PEN_INJECTOR | SUBCUTANEOUS | Status: DC

## 2014-09-24 MED ORDER — INSULIN PEN NEEDLE 32G X 6 MM MISC: Status: DC

## 2014-09-28 ENCOUNTER — Telehealth: Payer: Self-pay | Admitting: Endocrinology

## 2014-09-28 NOTE — Telephone Encounter (Signed)
Patient called and would like Megan to know that she would like to stay on the Novolog if the insurance is going to pay and whatever is going to cost the less   Please advise    Thank you

## 2014-09-28 NOTE — Telephone Encounter (Signed)
Noted  

## 2014-10-13 LAB — HM DIABETES EYE EXAM

## 2014-10-14 ENCOUNTER — Encounter: Payer: Self-pay | Admitting: Endocrinology

## 2014-11-25 ENCOUNTER — Telehealth: Payer: Self-pay

## 2014-11-25 DIAGNOSIS — I10 Essential (primary) hypertension: Secondary | ICD-10-CM

## 2014-11-28 NOTE — Telephone Encounter (Signed)
Pt called requesting a refill of lisinopril 20 mg.

## 2014-11-29 MED ORDER — LISINOPRIL 20 MG PO TABS: 20.0000 mg | ORAL_TABLET | Freq: Two times a day (BID) | ORAL | Status: DC

## 2014-11-29 NOTE — Telephone Encounter (Signed)
Gave pt message.

## 2014-11-29 NOTE — Telephone Encounter (Signed)
lmom to cb. 

## 2014-11-29 NOTE — Telephone Encounter (Signed)
HTN has not been discussed at previous office visits. Please advise.

## 2014-11-29 NOTE — Telephone Encounter (Signed)
I have refilled her lisinopril for one month for her. Her kidney function and potassium level was ok one year ago so it is safe to refill for now. As we have not seen her for this previously she will need to come in to be seen this month for further bp med refills.

## 2014-12-02 ENCOUNTER — Encounter: Payer: Self-pay | Admitting: Endocrinology

## 2014-12-02 ENCOUNTER — Ambulatory Visit (INDEPENDENT_AMBULATORY_CARE_PROVIDER_SITE_OTHER): Payer: BLUE CROSS/BLUE SHIELD | Admitting: Endocrinology

## 2014-12-02 VITALS — BP 134/88 | HR 85 | Temp 98.1°F | Ht 67.0 in | Wt 302.0 lb

## 2014-12-02 DIAGNOSIS — E1140 Type 2 diabetes mellitus with diabetic neuropathy, unspecified: Secondary | ICD-10-CM | POA: Diagnosis not present

## 2014-12-02 DIAGNOSIS — E1165 Type 2 diabetes mellitus with hyperglycemia: Secondary | ICD-10-CM

## 2014-12-02 DIAGNOSIS — IMO0002 Reserved for concepts with insufficient information to code with codable children: Secondary | ICD-10-CM

## 2014-12-02 LAB — HEMOGLOBIN A1C: Hgb A1c MFr Bld: 8.8 % — ABNORMAL HIGH (ref 4.6–6.5)

## 2014-12-02 MED ORDER — INSULIN LISPRO 100 UNIT/ML (KWIKPEN)
PEN_INJECTOR | SUBCUTANEOUS | Status: DC
Start: 2014-12-02 — End: 2015-03-07

## 2014-12-02 NOTE — Patient Instructions (Addendum)
check your blood sugar twice a day.  vary the time of day when you check, between before the 3 meals, and at bedtime.  also check if you have symptoms of your blood sugar being too high or too low.  please keep a record of the readings and bring it to your next appointment here.  please call us sooner if your blood sugar goes below 70, or if you have a lot of readings over 200. Please come back for a follow-up appointment in 3 months.   blood tests are being requested for you today.  We'll contact you with results.   Please consider having weight loss surgery.  It is good for your health.  Here is some information about it.  If you decide to consider further, please call the phone number in the papers, and register for a free informational meeting.
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
## 2014-12-03 ENCOUNTER — Telehealth: Payer: Self-pay

## 2014-12-03 ENCOUNTER — Other Ambulatory Visit: Payer: Self-pay

## 2014-12-03 MED ORDER — INSULIN ASPART 100 UNIT/ML FLEXPEN: PEN_INJECTOR | SUBCUTANEOUS | Status: DC

## 2014-12-03 NOTE — Telephone Encounter (Signed)
ok 

## 2014-12-03 NOTE — Telephone Encounter (Signed)
Rx for Novolog sent.

## 2014-12-03 NOTE — Telephone Encounter (Signed)
Received a request from pt's pharmacy requesting we change her Humalog to Novolog. Novolog is preferred, ok to change? Thanks!

## 2014-12-06 ENCOUNTER — Telehealth: Payer: Self-pay | Admitting: Endocrinology

## 2014-12-06 MED ORDER — INSULIN ASPART 100 UNIT/ML FLEXPEN: PEN_INJECTOR | SUBCUTANEOUS | Status: DC

## 2014-12-06 NOTE — Telephone Encounter (Signed)
Rx sent again for a 30 day supply. Initial rx stated fill 30 pens.

## 2014-12-06 NOTE — Telephone Encounter (Addendum)
Patient called that her Rx was only for 12 days and she needs one for 30  Dosage was increased to 80, 60, 120  Rx: Insulin   Pharmacy: [facility]   Thank you

## 2014-12-07 ENCOUNTER — Encounter: Payer: Self-pay | Admitting: Endocrinology

## 2014-12-09 ENCOUNTER — Telehealth: Payer: Self-pay | Admitting: Endocrinology

## 2014-12-09 NOTE — Telephone Encounter (Signed)
Contacted pharmacy and gave a verbal on the Novolog.

## 2014-12-09 NOTE — Telephone Encounter (Signed)
Pt needs rx for novolog called into [facility] pt needs it written for one month supply to 80/60/120

## 2014-12-10 ENCOUNTER — Encounter: Payer: Self-pay | Admitting: Family Medicine

## 2014-12-10 ENCOUNTER — Ambulatory Visit (INDEPENDENT_AMBULATORY_CARE_PROVIDER_SITE_OTHER): Payer: BLUE CROSS/BLUE SHIELD | Admitting: Family Medicine

## 2014-12-10 VITALS — BP 160/92 | HR 84 | Temp 98.8°F | Resp 16 | Ht 68.0 in | Wt 308.4 lb

## 2014-12-10 DIAGNOSIS — Z6841 Body Mass Index (BMI) 40.0 and over, adult: Secondary | ICD-10-CM | POA: Diagnosis not present

## 2014-12-10 DIAGNOSIS — E1165 Type 2 diabetes mellitus with hyperglycemia: Secondary | ICD-10-CM | POA: Diagnosis not present

## 2014-12-10 DIAGNOSIS — Z23 Encounter for immunization: Secondary | ICD-10-CM | POA: Diagnosis not present

## 2014-12-10 DIAGNOSIS — E11610 Type 2 diabetes mellitus with diabetic neuropathic arthropathy: Secondary | ICD-10-CM | POA: Diagnosis not present

## 2014-12-10 DIAGNOSIS — Z79899 Other long term (current) drug therapy: Secondary | ICD-10-CM

## 2014-12-10 DIAGNOSIS — I5032 Chronic diastolic (congestive) heart failure: Secondary | ICD-10-CM

## 2014-12-10 DIAGNOSIS — E1140 Type 2 diabetes mellitus with diabetic neuropathy, unspecified: Secondary | ICD-10-CM | POA: Diagnosis not present

## 2014-12-10 DIAGNOSIS — E785 Hyperlipidemia, unspecified: Secondary | ICD-10-CM | POA: Diagnosis not present

## 2014-12-10 DIAGNOSIS — I10 Essential (primary) hypertension: Secondary | ICD-10-CM

## 2014-12-10 DIAGNOSIS — IMO0002 Reserved for concepts with insufficient information to code with codable children: Secondary | ICD-10-CM

## 2014-12-10 LAB — LIPID PANEL
CHOL/HDL RATIO: 3.5 ratio
Cholesterol: 173 mg/dL (ref 0–200)
HDL: 50 mg/dL (ref >=46)
LDL Cholesterol: 92 mg/dL (ref 0–99)
TRIGLYCERIDES: 156 mg/dL — AB (ref <150)
VLDL: 31 mg/dL (ref 0–40)

## 2014-12-10 LAB — COMPREHENSIVE METABOLIC PANEL
ALT: 17 U/L (ref 0–35)
AST: 15 U/L (ref 0–37)
Albumin: 3.9 g/dL (ref 3.5–5.2)
Alkaline Phosphatase: 115 U/L (ref 39–117)
BILIRUBIN TOTAL: 0.3 mg/dL (ref 0.2–1.2)
BUN: 17 mg/dL (ref 6–23)
CALCIUM: 9.7 mg/dL (ref 8.4–10.5)
CHLORIDE: 103 meq/L (ref 96–112)
CO2: 24 mEq/L (ref 19–32)
CREATININE: 0.65 mg/dL (ref 0.50–1.10)
Glucose, Bld: 132 mg/dL — ABNORMAL HIGH (ref 70–99)
Potassium: 4.8 mEq/L (ref 3.5–5.3)
Sodium: 139 mEq/L (ref 135–145)
Total Protein: 6.7 g/dL (ref 6.0–8.3)

## 2014-12-10 LAB — TSH: TSH: 3.231 u[IU]/mL (ref 0.350–4.500)

## 2014-12-10 MED ORDER — GABAPENTIN 300 MG PO CAPS
300.0000 mg | ORAL_CAPSULE | Freq: Three times a day (TID) | ORAL | Status: DC
Start: 2014-12-10 — End: 2015-05-12

## 2014-12-10 MED ORDER — LISINOPRIL 20 MG PO TABS: 20.0000 mg | ORAL_TABLET | Freq: Two times a day (BID) | ORAL | Status: DC

## 2014-12-10 MED ORDER — OXYCODONE-ACETAMINOPHEN 5-325 MG PO TABS: 1.0000 | ORAL_TABLET | Freq: Three times a day (TID) | ORAL | Status: DC | PRN
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
## 2014-12-10 NOTE — Patient Instructions (Addendum)
Diabetes Mellitus and Food It is important for you to manage your blood sugar (glucose) level. Your blood glucose level can be greatly affected by what you eat. Eating healthier foods in the appropriate amounts throughout the day at about the same time each day will help you control your blood glucose level. It can also help slow or prevent worsening of your diabetes mellitus. Healthy eating may even help you improve the level of your blood pressure and reach or maintain a healthy weight.  HOW CAN FOOD AFFECT ME? Carbohydrates Carbohydrates affect your blood glucose level more than any other type of food. Your dietitian will help you determine how many carbohydrates to eat at each meal and teach you how to count carbohydrates. Counting carbohydrates is important to keep your blood glucose at a healthy level, especially if you are using insulin or taking certain medicines for diabetes mellitus. Alcohol Alcohol can cause sudden decreases in blood glucose (hypoglycemia), especially if you use insulin or take certain medicines for diabetes mellitus. Hypoglycemia can be a life-threatening condition. Symptoms of hypoglycemia (sleepiness, dizziness, and disorientation) are similar to symptoms of having too much alcohol.  If your health care provider has given you approval to drink alcohol, do so in moderation and use the following guidelines:  Women should not have more than one drink per day, and men should not have more than two drinks per day. One drink is equal to:  12 oz of beer.  5 oz of wine.  1 oz of hard liquor.  Do not drink on an empty stomach.  Keep yourself hydrated. Have water, diet soda, or unsweetened iced tea.  Regular soda, juice, and other mixers might contain a lot of carbohydrates and should be counted. WHAT FOODS ARE NOT RECOMMENDED? As you make food choices, it is important to remember that all foods are not the same. Some foods have fewer nutrients per serving than other  foods, even though they might have the same number of calories or carbohydrates. It is difficult to get your body what it needs when you eat foods with fewer nutrients. Examples of foods that you should avoid that are high in calories and carbohydrates but low in nutrients include:  Trans fats (most processed foods list trans fats on the Nutrition Facts label).  Regular soda.  Juice.  Candy.  Sweets, such as cake, pie, doughnuts, and cookies.  Fried foods. WHAT FOODS CAN I EAT? Have nutrient-rich foods, which will nourish your body and keep you healthy. The food you should eat also will depend on several factors, including:  The calories you need.  The medicines you take.  Your weight.  Your blood glucose level.  Your blood pressure level.  Your cholesterol level. You also should eat a variety of foods, including:  Protein, such as meat, poultry, fish, tofu, nuts, and seeds (lean animal proteins are best).  Fruits.  Vegetables.  Dairy products, such as milk, cheese, and yogurt (low fat is best).  Breads, grains, pasta, cereal, rice, and beans.  Fats such as olive oil, trans fat-free margarine, canola oil, avocado, and olives. DOES EVERYONE WITH DIABETES MELLITUS HAVE THE SAME MEAL PLAN? Because every person with diabetes mellitus is different, there is not one meal plan that works for everyone. It is very important that you meet with a dietitian who will help you create a meal plan that is just right for you. Document Released: 05/10/2005 Document Revised: 08/18/2013 Document Reviewed: 07/10/2013 ExitCare Patient Information 2015 [employer]. This   information is not intended to replace advice given to you by your health care provider. Make sure you discuss any questions you have with your health care provider.   Managing Your High Blood Pressure Blood pressure is a measurement of how forceful your blood is pressing against the walls of the arteries. Arteries are  muscular tubes within the circulatory system. Blood pressure does not stay the same. Blood pressure rises when you are active, excited, or nervous; and it lowers during sleep and relaxation. If the numbers measuring your blood pressure stay above normal most of the time, you are at risk for health problems. High blood pressure (hypertension) is a long-term (chronic) condition in which blood pressure is elevated. A blood pressure reading is recorded as two numbers, such as 120 over 80 (or 120/80). The first, higher number is called the systolic pressure. It is a measure of the pressure in your arteries as the heart beats. The second, lower number is called the diastolic pressure. It is a measure of the pressure in your arteries as the heart relaxes between beats.  Keeping your blood pressure in a normal range is important to your overall health and prevention of health problems, such as heart disease and stroke. When your blood pressure is uncontrolled, your heart has to work harder than normal. High blood pressure is a very common condition in adults because blood pressure tends to rise with age. Men and women are equally likely to have hypertension but at different times in life. Before age 68, men are more likely to have hypertension. After 49 years of age, women are more likely to have it. Hypertension is especially common in African Americans. This condition often has no signs or symptoms. The cause of the condition is usually not known. Your caregiver can help you come up with a plan to keep your blood pressure in a normal, healthy range. BLOOD PRESSURE STAGES Blood pressure is classified into four stages: normal, prehypertension, stage 1, and stage 2. Your blood pressure reading will be used to determine what type of treatment, if any, is necessary. Appropriate treatment options are tied to these four stages:  Normal  Systolic pressure (mm Hg): below 120.  Diastolic pressure (mm Hg): below  80. Prehypertension  Systolic pressure (mm Hg): 120 to 139.  Diastolic pressure (mm Hg): 80 to 89. Stage1  Systolic pressure (mm Hg): 140 to 159.  Diastolic pressure (mm Hg): 90 to 99. Stage2  Systolic pressure (mm Hg): 160 or above.  Diastolic pressure (mm Hg): 100 or above. RISKS RELATED TO HIGH BLOOD PRESSURE Managing your blood pressure is an important responsibility. Uncontrolled high blood pressure can lead to:  A heart attack.  A stroke.  A weakened blood vessel (aneurysm).  Heart failure.  Kidney damage.  Eye damage.  Metabolic syndrome.  Memory and concentration problems. HOW TO MANAGE YOUR BLOOD PRESSURE Blood pressure can be managed effectively with lifestyle changes and medicines (if needed). Your caregiver will help you come up with a plan to bring your blood pressure within a normal range. Your plan should include the following: Education  Read all information provided by your caregivers about how to control blood pressure.  Educate yourself on the latest guidelines and treatment recommendations. New research is always being done to further define the risks and treatments for high blood pressure. Lifestylechanges  Control your weight.  Avoid smoking.  Stay physically active.  Reduce the amount of salt in your diet.  Reduce stress.  Control any  chronic conditions, such as high cholesterol or diabetes.  Reduce your alcohol intake. Medicines  Several medicines (antihypertensive medicines) are available, if needed, to bring blood pressure within a normal range. Communication  Review all the medicines you take with your caregiver because there may be side effects or interactions.  Talk with your caregiver about your diet, exercise habits, and other lifestyle factors that may be contributing to high blood pressure.  See your caregiver regularly. Your caregiver can help you create and adjust your plan for managing high blood  pressure. RECOMMENDATIONS FOR TREATMENT AND FOLLOW-UP  The following recommendations are based on current guidelines for managing high blood pressure in nonpregnant adults. Use these recommendations to identify the proper follow-up period or treatment option based on your blood pressure reading. You can discuss these options with your caregiver.  Systolic pressure of 749 to 449 or diastolic pressure of 80 to 89: Follow up with your caregiver as directed.  Systolic pressure of 675 to 916 or diastolic pressure of 90 to 100: Follow up with your caregiver within 2 months.  Systolic pressure above 384 or diastolic pressure above 665: Follow up with your caregiver within 1 month.  Systolic pressure above 993 or diastolic pressure above 570: Consider antihypertensive therapy; follow up with your caregiver within 1 week.  Systolic pressure above 177 or diastolic pressure above 939: Begin antihypertensive therapy; follow up with your caregiver within 1 week. Document Released: 05/07/2012 Document Reviewed: 05/07/2012 [facility] Patient Information 2015 [place]. This information is not intended to replace advice given to you by your health care provider. Make sure you discuss any questions you have with your health care provider.

## 2014-12-13 MED ORDER — ATORVASTATIN CALCIUM 40 MG PO TABS: 40.0000 mg | ORAL_TABLET | Freq: Every day | ORAL | Status: DC

## 2014-12-28 ENCOUNTER — Encounter: Payer: Self-pay | Admitting: Endocrinology

## 2014-12-28 ENCOUNTER — Encounter: Payer: Self-pay | Admitting: Family Medicine

## 2014-12-31 ENCOUNTER — Encounter: Payer: Self-pay | Admitting: Endocrinology

## 2015-01-05 ENCOUNTER — Telehealth: Payer: Self-pay

## 2015-01-05 NOTE — Telephone Encounter (Signed)
Patient states she sent a mychart message to Dr. Brigitte Pulse requesting a letter to be written for surgery. She has not gotten a response and needs letter ASAP. Cb# [phone] (cell) or [phone] (work)

## 2015-01-05 NOTE — Telephone Encounter (Signed)
Will do asap so she has prior to [place] surg appt on 5/13 - we do need the last 5 years of weights for the bariatric surg letter- we have up to 2013 in [place] - does pt have a paper chart? If so, please have put in my box.  If she does not please find out if we can get prior records or if she remembers what she weighed about 2011 and 2012. Thanks.

## 2015-01-07 ENCOUNTER — Other Ambulatory Visit: Payer: Self-pay | Admitting: General Surgery

## 2015-01-07 ENCOUNTER — Encounter: Payer: Self-pay | Admitting: Family Medicine

## 2015-01-07 DIAGNOSIS — Z1239 Encounter for other screening for malignant neoplasm of breast: Secondary | ICD-10-CM

## 2015-01-07 DIAGNOSIS — Z1231 Encounter for screening mammogram for malignant neoplasm of breast: Secondary | ICD-10-CM

## 2015-01-07 NOTE — Telephone Encounter (Signed)
please pull paper chart and put in my box so we can get prior weights from this. Thanks.   Talked w/ pt - she is getting to proceed w/ the bariatric surgery - has to start on the process, go see the surgeons, etc.

## 2015-01-08 NOTE — Telephone Encounter (Signed)
Paper chart placed in dr shaw's box.

## 2015-01-10 ENCOUNTER — Telehealth: Payer: Self-pay | Admitting: Cardiology

## 2015-01-10 DIAGNOSIS — I10 Essential (primary) hypertension: Secondary | ICD-10-CM

## 2015-01-10 DIAGNOSIS — R0609 Other forms of dyspnea: Secondary | ICD-10-CM

## 2015-01-10 DIAGNOSIS — Z6841 Body Mass Index (BMI) 40.0 and over, adult: Secondary | ICD-10-CM

## 2015-01-10 DIAGNOSIS — I872 Venous insufficiency (chronic) (peripheral): Secondary | ICD-10-CM

## 2015-01-10 DIAGNOSIS — R0600 Dyspnea, unspecified: Secondary | ICD-10-CM

## 2015-01-10 NOTE — Telephone Encounter (Signed)
I would reschedule then, afterwards we can risk stratify her for the surgery.

## 2015-01-10 NOTE — Telephone Encounter (Signed)
Contacted Ammie Eversole with [facility] to inform her that per Dr Meda Coffee the pt will need to schedule her Lexiscan nuclear stress test as ordered back a year ago for DOE and surgical clearance.  Informed Ammie that the last time Dr Meda Coffee saw the pt was over a year ago, and the pt was to call back to our office to schedule her Lexiscan and fasting lipids due to needing surgical clearance for her foot d/t diabetes complications.  Informed Ammie that the pt never called to schedule the nuclear stress test as ordered.  Informed Ammie that the pt never stopped by checkout on the day she saw Dr Meda Coffee to have her [place] and labs scheduled, she said she would call back and never did.  Informed Ammie that the pt would need to reschedule this before any cardiac clearance will be provided.  Ammie states she will contact the pt to inform her of this, and instruct her in calling our office to arrange her [place] per Dr Meda Coffee.

## 2015-01-10 NOTE — Telephone Encounter (Signed)
Request for surgical clearance:  1. What type of surgery is being performed? weightloss surgery    2. When is this surgery scheduled? n/a   3. Are there any medications that need to be held prior to surgery and how long? no   4. Name of physician performing surgery? Dr. Redmond Pulling   5. What is your office phone and fax number? [phone]  6.

## 2015-01-10 NOTE — Telephone Encounter (Signed)
Pt contacted [place] to get her nuclear stress test scheduled so she can have clearance for her weight loss surgery.  Informed the pt that we will order a lexiscan myoview for her.  Provided pt education on what a lexiscan myoview is.  Briefly went over instructions for the pt to follow for her lexiscan myoview and informed her that I will leave a instruction sheet up front for her to pick up at the front desk to follow prior to having this test done.  Instructed the pt given her being a brittle diabetic, if her lexiscan is ordered for 10 am or earlier, she should take 1/2 dose of her p.m. insulin the night before her Lexi, and hold her morning dose of insulin the morning of the Lexiscan. Informed the pt that if her [place] is scheduled for 10 am or later, she should take 1/2 dose of her morning insulin with breakfast and hold any caffeine or decaf products. Informed the pt that I will send a message to our [facility] pool to contact the pt and schedule her Lexiscan for cardiac clearance.  Pt verbalized understanding and agrees with this plan.

## 2015-01-10 NOTE — Telephone Encounter (Signed)
Contacted Ammie Eversole LPN for Dr Redmond Pulling at [facility] in regards to this pt needing to obtain cardiac clearance from Dr Meda Coffee for weight loss surgery.   Per Ammie, the pt is undecided if she wants the lap band, sleeve, or row and y weight loss surgery.  Surgery is not scheduled yet, pending clearance.  Pts surgeon will be Dr Redmond Pulling and he is in epic. Fax number is [phone] attention Ammie and Dr Meda Coffee.  We may send clearance via EPIC.  Informed Ammie LPN that Dr Meda Coffee is currently out of the office today, but I will send her this message for further review and recommendation on clearance and follow-up with their office thereafter. Ammie LPN verbalized understanding and agrees with this plan.

## 2015-01-10 NOTE — Telephone Encounter (Signed)
I saw this patient a year ago and recommended a stress test, I am not sure why it didn't happen, can you check?

## 2015-01-10 NOTE — Telephone Encounter (Signed)
According to the appt notes in epic, the pt was to call back to have her nuclear stress test scheduled with same day labs and she never did.

## 2015-01-11 DIAGNOSIS — R0600 Dyspnea, unspecified: Secondary | ICD-10-CM | POA: Insufficient documentation

## 2015-01-11 DIAGNOSIS — R0609 Other forms of dyspnea: Secondary | ICD-10-CM | POA: Insufficient documentation

## 2015-01-11 NOTE — Addendum Note (Signed)
Addended by: Nuala Alpha on: 01/11/2015 11:40 AM   Modules accepted: Orders

## 2015-01-19 ENCOUNTER — Telehealth (HOSPITAL_COMMUNITY): Payer: Self-pay

## 2015-01-19 NOTE — Telephone Encounter (Signed)
Patient given detailed instructions per Myocardial Perfusion Study Information Sheet for test on 01/19/15 at 10:45 Patient verbalized understanding. [place], CNMT, RT-N/E. Meta Hatchet, CNMT

## 2015-01-25 ENCOUNTER — Telehealth (HOSPITAL_COMMUNITY): Payer: Self-pay

## 2015-01-25 NOTE — Telephone Encounter (Signed)
Patient given detailed instructions per Myocardial Perfusion Study Information Sheet for test on 01-26-2015 at 8:30am. Patient verbalized understanding. Donna Kidd, [name] A

## 2015-01-26 ENCOUNTER — Telehealth: Payer: Self-pay | Admitting: Family Medicine

## 2015-01-26 ENCOUNTER — Ambulatory Visit (HOSPITAL_COMMUNITY): Payer: BLUE CROSS/BLUE SHIELD | Attending: Cardiovascular Disease

## 2015-01-26 DIAGNOSIS — I872 Venous insufficiency (chronic) (peripheral): Secondary | ICD-10-CM | POA: Diagnosis not present

## 2015-01-26 DIAGNOSIS — R0609 Other forms of dyspnea: Secondary | ICD-10-CM | POA: Insufficient documentation

## 2015-01-26 DIAGNOSIS — Z6841 Body Mass Index (BMI) 40.0 and over, adult: Secondary | ICD-10-CM

## 2015-01-26 DIAGNOSIS — I10 Essential (primary) hypertension: Secondary | ICD-10-CM | POA: Diagnosis not present

## 2015-01-26 DIAGNOSIS — R0600 Dyspnea, unspecified: Secondary | ICD-10-CM

## 2015-01-26 MED ORDER — TECHNETIUM TC 99M SESTAMIBI GENERIC - CARDIOLITE
33.0000 | Freq: Once | INTRAVENOUS | Status: AC | PRN
  Administered 2015-01-26: 33 via INTRAVENOUS

## 2015-01-26 MED ORDER — REGADENOSON 0.4 MG/5ML IV SOLN
0.4000 mg | Freq: Once | INTRAVENOUS | Status: AC
  Administered 2015-01-26: 0.4 mg via INTRAVENOUS

## 2015-01-26 NOTE — Telephone Encounter (Signed)
I saw pt's husband last week in clinic for his physical and he mentioned that Laurabelle's foot was swelling - or maybe her normal foot was swollen. . . .  I'm not sure - would you please call patient and see how she is doing? Is there anything I can do for her? Thanks, Harmon Pier

## 2015-01-26 NOTE — Telephone Encounter (Signed)
Left message for pt to call back  °

## 2015-01-26 NOTE — Telephone Encounter (Signed)
Patient returned call.  She said her right calf is swollen, about 3-inches larger than the left calf.  She wants to know if she should come in to be seen, or if she needs to be referred to a specialist.  She had a vascular ultrasound recently, and it came back normal.  She also had a 2-day cardiac stress test several days ago.  Please advise.  Thank you.  CB#: [phone]

## 2015-01-27 ENCOUNTER — Ambulatory Visit (HOSPITAL_COMMUNITY): Payer: BLUE CROSS/BLUE SHIELD | Attending: Cardiovascular Disease

## 2015-01-27 LAB — MYOCARDIAL PERFUSION IMAGING
Estimated workload: 1 METS
LV dias vol: 123 mL
LV sys vol: 55 mL
Nuc Stress EF: 55 %
Peak BP: 166 mmHg
Peak HR: 88 {beats}/min
Percent of predicted max HR: 51 %
RATE: 0.27
Rest HR: 80 {beats}/min
SDS: 0
SRS: 4
SSS: 4
Stage 1 DBP: 80 mmHg
Stage 1 Grade: 0 %
Stage 1 HR: 80 {beats}/min
Stage 1 SBP: 167 mmHg
Stage 1 Speed: 0 mph
Stage 2 Grade: 0 %
Stage 2 HR: 80 {beats}/min
Stage 2 Speed: 0 mph
Stage 3 Grade: 0 %
Stage 3 HR: 96 {beats}/min
Stage 3 Speed: 0 mph
Stage 4 DBP: 74 mmHg
Stage 4 Grade: 0 %
Stage 4 HR: 88 {beats}/min
Stage 4 SBP: 166 mmHg
Stage 4 Speed: 0 mph
Stage 5 DBP: 74 mmHg
Stage 5 Grade: 0 %
Stage 5 HR: 85 {beats}/min
Stage 5 SBP: 155 mmHg
Stage 5 Speed: 0 mph
Stage 6 DBP: 73 mmHg
Stage 6 Grade: 0 %
Stage 6 HR: 83 {beats}/min
Stage 6 SBP: 136 mmHg
Stage 6 Speed: 0 mph
TID: 1.04

## 2015-01-27 MED ORDER — TECHNETIUM TC 99M SESTAMIBI GENERIC - CARDIOLITE
33.0000 | Freq: Once | INTRAVENOUS | Status: AC | PRN
  Administered 2015-01-27: 33 via INTRAVENOUS

## 2015-01-27 NOTE — Telephone Encounter (Signed)
She states she had an ultrasound of her groin down to her ankle recently and states we should be able to see it.

## 2015-02-01 LAB — HM DIABETES EYE EXAM

## 2015-02-02 ENCOUNTER — Encounter: Payer: Self-pay | Admitting: Family Medicine

## 2015-02-09 ENCOUNTER — Ambulatory Visit (HOSPITAL_COMMUNITY)
Admission: RE | Admit: 2015-02-09 | Discharge: 2015-02-09 | Disposition: A | Discharge status: Home / Self Care | Payer: BLUE CROSS/BLUE SHIELD | Source: Ambulatory Visit | Attending: General Surgery | Admitting: General Surgery

## 2015-02-09 ENCOUNTER — Other Ambulatory Visit: Payer: Self-pay

## 2015-02-09 DIAGNOSIS — R600 Localized edema: Secondary | ICD-10-CM | POA: Insufficient documentation

## 2015-02-09 DIAGNOSIS — Z6841 Body Mass Index (BMI) 40.0 and over, adult: Secondary | ICD-10-CM | POA: Insufficient documentation

## 2015-02-09 DIAGNOSIS — E1140 Type 2 diabetes mellitus with diabetic neuropathy, unspecified: Secondary | ICD-10-CM | POA: Insufficient documentation

## 2015-02-09 DIAGNOSIS — I10 Essential (primary) hypertension: Secondary | ICD-10-CM | POA: Insufficient documentation

## 2015-02-09 DIAGNOSIS — Z1231 Encounter for screening mammogram for malignant neoplasm of breast: Secondary | ICD-10-CM | POA: Diagnosis not present

## 2015-02-09 DIAGNOSIS — E1165 Type 2 diabetes mellitus with hyperglycemia: Secondary | ICD-10-CM | POA: Insufficient documentation

## 2015-02-09 DIAGNOSIS — E785 Hyperlipidemia, unspecified: Secondary | ICD-10-CM | POA: Insufficient documentation

## 2015-02-16 ENCOUNTER — Other Ambulatory Visit: Payer: Self-pay | Admitting: Obstetrics and Gynecology

## 2015-02-19 ENCOUNTER — Encounter: Payer: Self-pay | Admitting: Family Medicine

## 2015-03-02 ENCOUNTER — Encounter: Payer: Self-pay | Admitting: *Deleted

## 2015-03-02 DIAGNOSIS — E11319 Type 2 diabetes mellitus with unspecified diabetic retinopathy without macular edema: Secondary | ICD-10-CM | POA: Insufficient documentation

## 2015-03-05 ENCOUNTER — Encounter: Payer: BLUE CROSS/BLUE SHIELD | Attending: General Surgery | Admitting: Dietician

## 2015-03-05 ENCOUNTER — Encounter: Payer: Self-pay | Admitting: Dietician

## 2015-03-05 VITALS — Ht 68.0 in | Wt 313.3 lb

## 2015-03-05 DIAGNOSIS — Z713 Dietary counseling and surveillance: Secondary | ICD-10-CM | POA: Insufficient documentation

## 2015-03-05 DIAGNOSIS — Z6841 Body Mass Index (BMI) 40.0 and over, adult: Secondary | ICD-10-CM | POA: Insufficient documentation

## 2015-03-05 NOTE — Progress Notes (Signed)
  Pre-Op Assessment Visit:  Pre-Operative RYGB Surgery  Medical Nutrition Therapy:  Appt start time: 473   End time:  330.  Patient was seen on 03/05/2015 for Pre-Operative Nutrition Assessment. Assessment and letter of approval faxed to [facility] Bariatric Surgery Program coordinator on 03/05/2015.   Preferred Learning Style:   No preference indicated   Learning Readiness:   Ready  Handouts given during visit include:  Pre-Op Goals Bariatric Surgery Protein Shakes Bariatric Fast Food Guide   During the appointment today the following Pre-Op Goals were reviewed with the patient: Maintain or lose weight as instructed by your surgeon Make healthy food choices Begin to limit portion sizes Limited concentrated sugars and fried foods Keep fat/sugar in the single digits per serving on   food labels Practice CHEWING your food  (aim for 30 chews per bite or until applesauce consistency) Practice not drinking 15 minutes before, during, and 30 minutes after each meal/snack Avoid all carbonated beverages  Avoid/limit caffeinated beverages  Avoid all sugar-sweetened beverages Consume 3 meals per day; eat every 3-5 hours Make a list of non-food related activities Aim for 64-100 ounces of FLUID daily  Aim for at least 60-80 grams of PROTEIN daily Look for a liquid protein source that contain ?15 g protein and ?5 g carbohydrate  (ex: shakes, drinks, shots)  Patient-Centered Goals: -Reduced insulin -Healthier and more active overall -Wear a little black dress on a cruise  Demonstrated degree of understanding via:  Teach Back  Teaching Method Utilized:  Visual Auditory Hands on  Barriers to learning/adherence to lifestyle change: physical limitations  [facility] to [facility] to enroll in Pre-Op and Post-Op Nutrition Education when surgery date is scheduled.

## 2015-03-05 NOTE — Patient Instructions (Signed)

## 2015-03-07 ENCOUNTER — Telehealth: Payer: Self-pay | Admitting: Endocrinology

## 2015-03-07 ENCOUNTER — Encounter: Payer: Self-pay | Admitting: Endocrinology

## 2015-03-07 ENCOUNTER — Ambulatory Visit (INDEPENDENT_AMBULATORY_CARE_PROVIDER_SITE_OTHER): Payer: BLUE CROSS/BLUE SHIELD | Admitting: Endocrinology

## 2015-03-07 VITALS — BP 135/84 | HR 83 | Temp 97.8°F | Ht 68.0 in | Wt 315.0 lb

## 2015-03-07 DIAGNOSIS — E1140 Type 2 diabetes mellitus with diabetic neuropathy, unspecified: Secondary | ICD-10-CM

## 2015-03-07 DIAGNOSIS — IMO0002 Reserved for concepts with insufficient information to code with codable children: Secondary | ICD-10-CM

## 2015-03-07 DIAGNOSIS — E1165 Type 2 diabetes mellitus with hyperglycemia: Secondary | ICD-10-CM | POA: Diagnosis not present

## 2015-03-07 LAB — POCT GLYCOSYLATED HEMOGLOBIN (HGB A1C): HEMOGLOBIN A1C: 8.9

## 2015-03-07 MED ORDER — INSULIN ASPART 100 UNIT/ML FLEXPEN: PEN_INJECTOR | SUBCUTANEOUS | Status: DC

## 2015-03-07 MED ORDER — INSULIN LISPRO 100 UNIT/ML (KWIKPEN): PEN_INJECTOR | SUBCUTANEOUS | Status: DC

## 2015-03-07 NOTE — Telephone Encounter (Signed)
either is ok--same dosage
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
## 2015-03-07 NOTE — Telephone Encounter (Signed)
This pt is stating that she has never taken Humalog.She has only taken Novolog due to [facility] not covering the Rx Humalog.In the last office note it states she is be taking Humalog before each meal 80-60-150. Please advise pt and make sure this okay to take the same units with the Novolog medication.

## 2015-03-07 NOTE — Telephone Encounter (Signed)
Letter written - in letter tabs

## 2015-03-07 NOTE — Telephone Encounter (Signed)
[facility] pharmacy calling for clarification for the RX for the humalog

## 2015-03-07 NOTE — Telephone Encounter (Signed)
I will send in the new Rx

## 2015-03-07 NOTE — Patient Instructions (Addendum)
check your blood sugar twice a day.  vary the time of day when you check, between before the 3 meals, and at bedtime.  also check if you have symptoms of your blood sugar being too high or too low.  please keep a record of the readings and bring it to your next appointment here.  please call us sooner if your blood sugar goes below 70, or if you have a lot of readings over 200. Please come back for a follow-up appointment when you get out of the hospital.   Please increase the humalog to 3 times a day (just before each meal) 80-60-150 units.

## 2015-03-18 ENCOUNTER — Ambulatory Visit: Payer: Self-pay | Admitting: General Surgery

## 2015-03-20 ENCOUNTER — Encounter: Payer: Self-pay | Admitting: Endocrinology

## 2015-03-21 ENCOUNTER — Other Ambulatory Visit: Payer: Self-pay

## 2015-03-21 MED ORDER — INSULIN PEN NEEDLE 32G X 6 MM MISC: Status: DC

## 2015-04-04 ENCOUNTER — Encounter: Payer: BLUE CROSS/BLUE SHIELD | Attending: General Surgery

## 2015-04-04 VITALS — Ht 68.0 in | Wt 315.5 lb

## 2015-04-04 DIAGNOSIS — Z713 Dietary counseling and surveillance: Secondary | ICD-10-CM | POA: Insufficient documentation

## 2015-04-04 DIAGNOSIS — Z6841 Body Mass Index (BMI) 40.0 and over, adult: Secondary | ICD-10-CM | POA: Diagnosis not present

## 2015-04-04 NOTE — Progress Notes (Signed)
  Pre-Operative Nutrition Class:  Appt start time: 8337   End time:  1830.  Patient was seen on 04/04/2015 for Pre-Operative Bariatric Surgery Education at [facility].   Surgery date: 05/09/2015 Surgery type: RYGB Start weight at [facility]: 313 lbs on 03/05/15 Weight today: 315.5 lbs  TANITA  BODY COMP RESULTS  04/04/15   BMI (kg/m^2) 48   Fat Mass (lbs) 138   Fat Free Mass (lbs) 177.5   Total Body Water (lbs) 130   Samples given per MNT protocol. Patient educated on appropriate usage: Celebrate multivitamin chew (orange - qty 1) Lot #: [id number] Exp: 10/2016  Premier protein shake (chocolate - qty 1) Lot #: [id number] Exp: 11/2015  Renee Pain Protein Powder (unflavored - qty 1) Lot #: [id number] Exp: 02/2016  The following the learning objectives were met by the patient during this course:  Identify Pre-Op Dietary Goals and will begin 2 weeks pre-operatively  Identify appropriate sources of fluids and proteins   State protein recommendations and appropriate sources pre and post-operatively  Identify Post-Operative Dietary Goals and will follow for 2 weeks post-operatively  Identify appropriate multivitamin and calcium sources  Describe the need for physical activity post-operatively and will follow MD recommendations  State when to call healthcare provider regarding medication questions or post-operative complications  Handouts given during class include:  Pre-Op Bariatric Surgery Diet Handout  Protein Shake Handout  Post-Op Bariatric Surgery Nutrition Handout  BELT Program Information Flyer  Support Group Information Flyer  [facility] Bariatric Supplements Price List  Follow-Up Plan: Patient will follow-up at [facility] 2 weeks post operatively for diet advancement per MD.

## 2015-04-29 NOTE — Patient Instructions (Addendum)
Donna Kidd  04/29/2015   Your procedure is scheduled on: May 09, 2015  Report to [facility] Main  Entrance take [place]  elevators to 3rd floor to  [place] at 8:45 AM.  Call this number if you have problems the morning of surgery [phone]   Remember: ONLY 1 PERSON MAY GO WITH YOU TO SHORT STAY TO GET  READY MORNING OF [place].  Do not eat food or drink liquids :After Midnight.              Bowel prep as instructed              Bedtime insulin:  Take 1/2 of insulin dose              Eat healthy snack before midnight    Take these medicines the morning of surgery with A SIP OF WATER: None                               You may not have any metal on your body including hair pins and              piercings  Do not wear jewelry, make-up, lotions, powders or perfumes, deodorant             Do not wear nail polish.  Do not shave  48 hours prior to surgery.              Do not bring valuables to the hospital. [place].  Contacts, dentures or bridgework may not be worn into surgery.  Leave suitcase in the car. After surgery it may be brought to your room.        Special Instructions: coughing and deep breathing exercises, leg exercises              Please read over the following fact sheets you were given: _____________________________________________________________________             [facility] - Preparing for Surgery Before surgery, you can play an important role.  Because skin is not sterile, your skin needs to be as free of germs as possible.  You can reduce the number of germs on your skin by washing with CHG (chlorahexidine gluconate) soap before surgery.  CHG is an antiseptic cleaner which kills germs and bonds with the skin to continue killing germs even after washing. Please DO NOT use if you have an allergy to CHG or antibacterial soaps.  If your skin becomes  reddened/irritated stop using the CHG and inform your nurse when you arrive at Short Stay. Do not shave (including legs and underarms) for at least 48 hours prior to the first CHG shower.  You may shave your face/neck. Please follow these instructions carefully:  1.  Shower with CHG Soap the night before surgery and the  morning of Surgery.  2.  If you choose to wash your hair, wash your hair first as usual with your  normal  shampoo.  3.  After you shampoo, rinse your hair and body thoroughly to remove the  shampoo.                           4.  Use CHG as you would any other liquid soap.  You can apply chg directly  to the skin and wash                       Gently with a scrungie or clean washcloth.  5.  Apply the CHG Soap to your body ONLY FROM THE NECK DOWN.   Do not use on face/ open                           Wound or open sores. Avoid contact with eyes, ears mouth and genitals (private parts).                       Wash face,  Genitals (private parts) with your normal soap.             6.  Wash thoroughly, paying special attention to the area where your surgery  will be performed.  7.  Thoroughly rinse your body with warm water from the neck down.  8.  DO NOT shower/wash with your normal soap after using and rinsing off  the CHG Soap.                9.  Pat yourself dry with a clean towel.            10.  Wear clean pajamas.            11.  Place clean sheets on your bed the night of your first shower and do not  sleep with pets. Day of Surgery : Do not apply any lotions/deodorants the morning of surgery.  Please wear clean clothes to the hospital/surgery center.  FAILURE TO FOLLOW THESE INSTRUCTIONS MAY RESULT IN THE CANCELLATION OF YOUR SURGERY PATIENT SIGNATURE_________________________________  NURSE SIGNATURE__________________________________  ________________________________________________________________________

## 2015-05-04 ENCOUNTER — Encounter (HOSPITAL_COMMUNITY): Payer: Self-pay

## 2015-05-04 ENCOUNTER — Encounter (HOSPITAL_COMMUNITY)
Admission: RE | Admit: 2015-05-04 | Discharge: 2015-05-04 | Disposition: A | Discharge status: Home / Self Care | Payer: BLUE CROSS/BLUE SHIELD | Source: Ambulatory Visit | Attending: General Surgery | Admitting: General Surgery

## 2015-05-04 DIAGNOSIS — Z01812 Encounter for preprocedural laboratory examination: Secondary | ICD-10-CM | POA: Diagnosis not present

## 2015-05-04 DIAGNOSIS — Z6841 Body Mass Index (BMI) 40.0 and over, adult: Secondary | ICD-10-CM | POA: Diagnosis not present

## 2015-05-04 HISTORY — DX: Reserved for inherently not codable concepts without codable children: IMO0001

## 2015-05-04 HISTORY — DX: Anemia, unspecified: D64.9

## 2015-05-04 HISTORY — DX: Pneumonia, unspecified organism: J18.9

## 2015-05-04 LAB — COMPREHENSIVE METABOLIC PANEL
ALT: 36 U/L (ref 14–54)
AST: 42 U/L — AB (ref 15–41)
Albumin: 4.2 g/dL (ref 3.5–5.0)
Alkaline Phosphatase: 108 U/L (ref 38–126)
Anion gap: 14 (ref 5–15)
BILIRUBIN TOTAL: 0.4 mg/dL (ref 0.3–1.2)
BUN: 34 mg/dL — AB (ref 6–20)
CO2: 22 mmol/L (ref 22–32)
CREATININE: 0.82 mg/dL (ref 0.44–1.00)
Calcium: 10 mg/dL (ref 8.9–10.3)
Chloride: 102 mmol/L (ref 101–111)
Glucose, Bld: 304 mg/dL — ABNORMAL HIGH (ref 65–99)
POTASSIUM: 5.1 mmol/L (ref 3.5–5.1)
Sodium: 138 mmol/L (ref 135–145)
TOTAL PROTEIN: 8 g/dL (ref 6.5–8.1)

## 2015-05-04 LAB — CBC WITH DIFFERENTIAL/PLATELET
BASOS ABS: 0 10*3/uL (ref 0.0–0.1)
Basophils Relative: 0 % (ref 0–1)
EOS PCT: 2 % (ref 0–5)
Eosinophils Absolute: 0.2 10*3/uL (ref 0.0–0.7)
HEMATOCRIT: 37 % (ref 36.0–46.0)
Hemoglobin: 12.1 g/dL (ref 12.0–15.0)
LYMPHS ABS: 2.5 10*3/uL (ref 0.7–4.0)
LYMPHS PCT: 27 % (ref 12–46)
MCH: 29.4 pg (ref 26.0–34.0)
MCHC: 32.7 g/dL (ref 30.0–36.0)
MCV: 89.8 fL (ref 78.0–100.0)
MONO ABS: 0.5 10*3/uL (ref 0.1–1.0)
Monocytes Relative: 5 % (ref 3–12)
NEUTROS ABS: 6.3 10*3/uL (ref 1.7–7.7)
Neutrophils Relative %: 66 % (ref 43–77)
PLATELETS: 295 10*3/uL (ref 150–400)
RBC: 4.12 MIL/uL (ref 3.87–5.11)
RDW: 13.8 % (ref 11.5–15.5)
WBC: 9.5 10*3/uL (ref 4.0–10.5)

## 2015-05-04 LAB — HCG, SERUM, QUALITATIVE: PREG SERUM: NEGATIVE

## 2015-05-04 NOTE — Progress Notes (Signed)
03-07-15 - LOV - Dr. Loanne Drilling (int.med.) - [facility] 02-15-15 - T. Vogler, DPM (podiatry) - [place] 02-09-15 - 2V CXR - [facility] 02-09-15 - UGI w/KUB - [facility] 02-09-15 - EKG - [facility] 01-26-15 - Stress Test- [facility] 01-26-15 - LOV - Dr. Meda Coffee (cardio) - EPIC 01-13-15 - LE VEN Duplex R - [facility] - Care Everywhere - EPIC 01-06-15 - 2D Echo - EPIC 12-10-14 - LOV - Dr. Brigitte Pulse (fam.med.)- EPIC 12-02-14 - HgbA1C - EPIC

## 2015-05-04 NOTE — Progress Notes (Signed)
05-04-15 - CMP lab results from preop visit on 05-04-15 faxed to Dr. Greer Pickerel via [facility]

## 2015-05-06 ENCOUNTER — Ambulatory Visit: Payer: Self-pay | Admitting: General Surgery

## 2015-05-06 NOTE — H&P (Signed)
Donna Kidd. Donna Kidd 04/28/2015 4:00 PM Location: [facility] Patient #: [id number] DOB: Jan 04, 1966 Single / Language: [place] / Race: Black or African American, White Female History of Present Illness Donna Kidd Danford MD; 05/06/2015 1:22 PM) The patient is a 49 year old female who presents for a pre-op visit. She comes in today for her preoperative visit. She is currently been approved for laparoscopic Roux-en-Y gastric bypass on September 12. I initially met her on May 13. Her weight at time was 309 pounds. She denies any medical changes since she was last seen. She did have some menses changes and underwent a biopsy which was negative. Otherwise she has no new complaints. Her upper GI was within normal limits. Her chest x-ray and EKG were also normal. Her CBC showed a mild anemia with an 11.8 hemoglobin, hematocrit 36.9. Her H. pylori was negative. Her vitamin D, B12, folate, and iron levels were within normal limits. She got cardiac clearance from Dr. Delton See. Dr. [name] manages her diabetes. She denies any chest pain, chest pressure, shortness of breath, TIAs or amaurosis fugax. 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 Tonsillectomy  Allergies Fay Records, CMA; 04/28/2015 4:08 PM) No Known Drug Allergies 01/07/2015  Medication History Atilano Ina, MD; 05/06/2015 1:24 PM) Atorvastatin Calcium (40MG  Tablet, Oral) Active. Gabapentin (300MG  Capsule, Oral) Active. Lisinopril (20MG  Tablet,  Oral) Active. NovoLOG FlexPen (100UNIT/ML Soln Pen-inj, Subcutaneous) Active. Medications Reconciled OxyCODONE HCl (5MG /5ML Solution, 5-10 Milliliter Oral every four hours, as needed, Taken starting 04/28/2015) Active. Ondansetron (4MG  Tablet Disperse, 1 (one) Tablet Disperse Oral every six hours, as needed, Taken starting 04/28/2015) Active. Protonix (40MG  Tablet DR, 1 (one) Tablet DR Oral daily, Taken starting 04/28/2015) Active.  Social History Atilano Ina, MD; 05/06/2015 1:24 PM) No drug use Tobacco use Never smoker.  Family History Atilano Ina, MD; 05/06/2015 1:24 PM) Melanoma Father. Respiratory Condition Father. Thyroid problems Mother. Breast Cancer Family Members In General. Cerebrovascular Accident Mother. Alcohol Abuse Sister. Cervical Cancer Family Members In General. Hypertension Family Members In General, Father. Kidney Disease Family Members In General. Heart Disease Family Members In General, Father. Depression Father. Diabetes Mellitus Brother, Family Members In General, Father, Mother.  Pregnancy / Birth History Atilano Ina, MD; 05/06/2015 1:24 PM) Irregular periods Maternal age 64-25     Review of Systems Atilano Ina, MD; 05/06/2015 1:20 00) General Present- Fatigue, Night Sweats and Weight Gain. Not Present- Appetite Loss, Chills, Fever and Weight Loss. Skin Present- Non-Healing Wounds. Not Present- Change in Wart/Mole, Dryness, Hives, Jaundice, New Lesions, Rash and Ulcer. HEENT Present- Wears glasses/contact lenses. Not Present- Earache, Hearing Loss, Hoarseness, Nose Bleed, Oral Ulcers, Ringing in the Ears, Seasonal Allergies, Sinus Pain, Sore Throat, Visual Disturbances and Yellow Eyes. Respiratory Present- Snoring. Not Present- Bloody sputum, Chronic Cough, Difficulty Breathing and Wheezing. Cardiovascular Present- Shortness of Breath. Not Present- Chest Pain, Difficulty Breathing Lying Down, Leg Cramps, Palpitations, Rapid Heart  Rate and Swelling of Extremities. Gastrointestinal Present- Vomiting. Not Present- Abdominal Pain, Bloating, Bloody Stool, Change in Bowel Habits, Chronic diarrhea, Constipation, Difficulty Swallowing, Excessive gas, Gets  full quickly at meals, Hemorrhoids, Indigestion, Nausea and Rectal Pain. Female Genitourinary Present- Frequency and Nocturia. Not Present- Painful Urination, Pelvic Pain and Urgency. Musculoskeletal Present- Back Pain, Joint Stiffness and Swelling of Extremities. Not Present- Joint Pain, Muscle Pain and Muscle Weakness. Neurological Present- Numbness and Trouble walking. Not Present- Decreased Memory, Fainting, Headaches, Seizures, Tingling, Tremor and Weakness. Psychiatric Not Present- Anxiety, Bipolar, Change in Sleep Pattern, Depression, Fearful and Frequent crying. Endocrine Not Present- Cold Intolerance, Excessive Hunger, Hair Changes, Heat Intolerance, Hot flashes and New Diabetes. Hematology Not Present- Easy Bruising, Excessive bleeding, Gland problems, HIV and Persistent Infections.  Vitals Fay Records CMA; 04/28/2015 4:08 PM) 04/28/2015 4:08 PM Weight: 303.6 lb Height: 68in Body Surface Area: 2.57 m Body Mass Index: 46.16 kg/m Temp.: 97.66F(Temporal)  Pulse: 98 (Regular)  BP: 138/78 (Sitting, Left Arm, Standard)     Physical Exam Donna Areola M. Donna Mckiver MD; 05/06/2015 1:20 PM)  General Mental Status-Alert. General Appearance-Consistent with stated age. Hydration-Well hydrated. Voice-Normal. Note: morbidly obese   Head and Neck Head-normocephalic, atraumatic with no lesions or palpable masses. Trachea-midline. Thyroid Gland Characteristics - normal size and consistency.  Eye Eyeball - Bilateral-Extraocular movements intact. Sclera/Conjunctiva - Bilateral-No scleral icterus.  Chest and Lung Exam Chest and lung exam reveals -quiet, even and easy respiratory effort with no use of accessory muscles and on auscultation, normal breath  sounds, no adventitious sounds and normal vocal resonance. Inspection Chest Wall - Normal. Back - normal.  Breast - Did not examine.  Cardiovascular Cardiovascular examination reveals -normal heart sounds, regular rate and rhythm with no murmurs and normal pedal pulses bilaterally.  Abdomen Inspection Inspection of the abdomen reveals - No Hernias. Skin - Scar - no surgical scars. Palpation/Percussion Palpation and Percussion of the abdomen reveal - Soft, Non Tender, No Rebound tenderness, No Rigidity (guarding) and No hepatosplenomegaly. Auscultation Auscultation of the abdomen reveals - Bowel sounds normal.  Peripheral Vascular Upper Extremity Palpation - Pulses bilaterally normal.  Neurologic Neurologic evaluation reveals -alert and oriented x 3 with no impairment of recent or remote memory. Mental Status-Normal.  Neuropsychiatric The patient's mood and affect are described as -normal. Judgment and Insight-insight is appropriate concerning matters relevant to self.  Musculoskeletal Normal Exam - Left-Upper Extremity Strength Normal and Lower Extremity Strength Normal. Normal Exam - Right-Upper Extremity Strength Normal and Lower Extremity Strength Normal. Note: Left foot in hard boot   Lymphatic Head & Neck  General Head & Neck Lymphatics: Bilateral - Description - Normal. Axillary - Did not examine. Femoral & Inguinal - Did not examine.    Assessment & Plan Donna Areola M. Junella Domke MD; 05/06/2015 1:23 PM)  OBESITY, MORBID, BMI 40.0-49.9 (E66.01) Impression: We reviewed her preoperative workup. We discussed her mild anemia. We discussedthe results of her upper GI. We discussed the typical hospitalization course. We also discussed the typical postoperative recovery course. We discussed the importance of following up with Dr. [name] postoperatively regarding her diabetes. All of her questions were asked and answered. She was given her postoperative prescriptions  for pain medication, nausea, and heartburn. She is also given instructions for her postoperative bowel prep  Current Plans Pt Education - EMW_preopbariatric Started OxyCODONE HCl 5MG /5ML, 5-10 Milliliter every four hours, as needed, 200 Milliliter, 04/28/2015, No Refill. Started Ondansetron 4MG , 1 (one) Tablet Disperse every six hours, as needed, #20, 04/28/2015, No Refill. Started Protonix 40MG , 1 (one) Tablet DR daily, #30, 04/28/2015, No Refill. 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 Andrey Campanile, MD, FACS General, Bariatric, & Minimally Invasive Surgery [facility], [place]

## 2015-05-09 ENCOUNTER — Inpatient Hospital Stay (HOSPITAL_COMMUNITY): Payer: BLUE CROSS/BLUE SHIELD | Admitting: Certified Registered Nurse Anesthetist

## 2015-05-09 ENCOUNTER — Inpatient Hospital Stay (HOSPITAL_COMMUNITY)
Admission: RE | Admit: 2015-05-09 | Discharge: 2015-05-12 | DRG: 621 | Disposition: A | Discharge status: Home / Self Care | Payer: BLUE CROSS/BLUE SHIELD | Source: Ambulatory Visit | Attending: General Surgery | Admitting: General Surgery

## 2015-05-09 ENCOUNTER — Encounter (HOSPITAL_COMMUNITY): Payer: Self-pay | Admitting: *Deleted

## 2015-05-09 ENCOUNTER — Encounter (HOSPITAL_COMMUNITY)
Admission: RE | Disposition: A | Discharge status: Home / Self Care | Payer: Self-pay | Source: Ambulatory Visit | Attending: General Surgery

## 2015-05-09 DIAGNOSIS — E11610 Type 2 diabetes mellitus with diabetic neuropathic arthropathy: Secondary | ICD-10-CM | POA: Diagnosis present

## 2015-05-09 DIAGNOSIS — I5032 Chronic diastolic (congestive) heart failure: Secondary | ICD-10-CM

## 2015-05-09 DIAGNOSIS — I739 Peripheral vascular disease, unspecified: Secondary | ICD-10-CM | POA: Diagnosis present

## 2015-05-09 DIAGNOSIS — Z794 Long term (current) use of insulin: Secondary | ICD-10-CM

## 2015-05-09 DIAGNOSIS — Z79899 Other long term (current) drug therapy: Secondary | ICD-10-CM

## 2015-05-09 DIAGNOSIS — D649 Anemia, unspecified: Secondary | ICD-10-CM | POA: Diagnosis present

## 2015-05-09 DIAGNOSIS — E781 Pure hyperglyceridemia: Secondary | ICD-10-CM | POA: Diagnosis present

## 2015-05-09 DIAGNOSIS — I10 Essential (primary) hypertension: Secondary | ICD-10-CM | POA: Diagnosis present

## 2015-05-09 DIAGNOSIS — R110 Nausea: Secondary | ICD-10-CM

## 2015-05-09 DIAGNOSIS — E875 Hyperkalemia: Secondary | ICD-10-CM | POA: Diagnosis present

## 2015-05-09 DIAGNOSIS — M1990 Unspecified osteoarthritis, unspecified site: Secondary | ICD-10-CM | POA: Diagnosis present

## 2015-05-09 DIAGNOSIS — E11319 Type 2 diabetes mellitus with unspecified diabetic retinopathy without macular edema: Secondary | ICD-10-CM | POA: Diagnosis present

## 2015-05-09 DIAGNOSIS — Z01812 Encounter for preprocedural laboratory examination: Secondary | ICD-10-CM | POA: Diagnosis not present

## 2015-05-09 DIAGNOSIS — E669 Obesity, unspecified: Secondary | ICD-10-CM

## 2015-05-09 DIAGNOSIS — E1140 Type 2 diabetes mellitus with diabetic neuropathy, unspecified: Secondary | ICD-10-CM | POA: Diagnosis present

## 2015-05-09 DIAGNOSIS — Z6841 Body Mass Index (BMI) 40.0 and over, adult: Secondary | ICD-10-CM | POA: Diagnosis not present

## 2015-05-09 DIAGNOSIS — E1165 Type 2 diabetes mellitus with hyperglycemia: Secondary | ICD-10-CM | POA: Diagnosis present

## 2015-05-09 DIAGNOSIS — E780 Pure hypercholesterolemia: Secondary | ICD-10-CM | POA: Diagnosis present

## 2015-05-09 DIAGNOSIS — E785 Hyperlipidemia, unspecified: Secondary | ICD-10-CM | POA: Diagnosis present

## 2015-05-09 DIAGNOSIS — Z9884 Bariatric surgery status: Secondary | ICD-10-CM

## 2015-05-09 HISTORY — PX: GASTRIC ROUX-EN-Y: SHX5262

## 2015-05-09 LAB — HEMOGLOBIN AND HEMATOCRIT, BLOOD
HCT: 36.5 % (ref 36.0–46.0)
HEMOGLOBIN: 12.1 g/dL (ref 12.0–15.0)

## 2015-05-09 LAB — GLUCOSE, CAPILLARY
GLUCOSE-CAPILLARY: 225 mg/dL — AB (ref 65–99)
GLUCOSE-CAPILLARY: 233 mg/dL — AB (ref 65–99)
Glucose-Capillary: 286 mg/dL — ABNORMAL HIGH (ref 65–99)
Glucose-Capillary: 429 mg/dL — ABNORMAL HIGH (ref 65–99)

## 2015-05-09 LAB — PREGNANCY, URINE: Preg Test, Ur: NEGATIVE

## 2015-05-09 SURGERY — LAPAROSCOPIC ROUX-EN-Y GASTRIC BYPASS WITH UPPER ENDOSCOPY
Anesthesia: General | Site: Abdomen

## 2015-05-09 MED ORDER — ACETAMINOPHEN 160 MG/5ML PO SOLN
650.0000 mg | ORAL | Status: DC | PRN
  Administered 2015-05-10 – 2015-05-12 (×2): 650 mg via ORAL
  Filled 2015-05-09 (×2): qty 20.3

## 2015-05-09 MED ORDER — 0.9 % SODIUM CHLORIDE (POUR BTL) OPTIME
TOPICAL | Status: DC | PRN
  Administered 2015-05-09: 1000 mL

## 2015-05-09 MED ORDER — PROPOFOL 10 MG/ML IV BOLUS
INTRAVENOUS | Status: DC | PRN
  Administered 2015-05-09: 180 mg via INTRAVENOUS

## 2015-05-09 MED ORDER — ACETAMINOPHEN 10 MG/ML IV SOLN
1000.0000 mg | Freq: Four times a day (QID) | INTRAVENOUS | Status: AC
  Administered 2015-05-10 (×2): 1000 mg via INTRAVENOUS
  Filled 2015-05-09 (×3): qty 100

## 2015-05-09 MED ORDER — UNJURY VANILLA POWDER
2.0000 [oz_av] | Freq: Four times a day (QID) | ORAL | Status: DC
  Administered 2015-05-11: 2 [oz_av] via ORAL

## 2015-05-09 MED ORDER — PROMETHAZINE HCL 25 MG/ML IJ SOLN
6.2500 mg | INTRAMUSCULAR | Status: AC | PRN
  Administered 2015-05-09 (×2): 12.5 mg via INTRAVENOUS

## 2015-05-09 MED ORDER — SODIUM CHLORIDE 0.9 % IJ SOLN
INTRAMUSCULAR | Status: AC
  Filled 2015-05-09: qty 50

## 2015-05-09 MED ORDER — LABETALOL HCL 5 MG/ML IV SOLN
5.0000 mg | INTRAVENOUS | Status: DC
  Administered 2015-05-09: 5 mg via INTRAVENOUS

## 2015-05-09 MED ORDER — INSULIN ASPART 100 UNIT/ML ~~LOC~~ SOLN
SUBCUTANEOUS | Status: AC
  Filled 2015-05-09: qty 1

## 2015-05-09 MED ORDER — LACTATED RINGERS IR SOLN
Status: DC | PRN
  Administered 2015-05-09: 1

## 2015-05-09 MED ORDER — BUPIVACAINE LIPOSOME 1.3 % IJ SUSP
20.0000 mL | Freq: Once | INTRAMUSCULAR | Status: AC
  Administered 2015-05-09: 20 mL
  Filled 2015-05-09: qty 20

## 2015-05-09 MED ORDER — INSULIN ASPART 100 UNIT/ML ~~LOC~~ SOLN
0.0000 [IU] | SUBCUTANEOUS | Status: DC
  Administered 2015-05-09: 20 [IU] via SUBCUTANEOUS
  Administered 2015-05-10 (×2): 7 [IU] via SUBCUTANEOUS
  Administered 2015-05-10: 15 [IU] via SUBCUTANEOUS
  Administered 2015-05-10 (×3): 7 [IU] via SUBCUTANEOUS
  Administered 2015-05-11: 4 [IU] via SUBCUTANEOUS
  Administered 2015-05-11 (×4): 7 [IU] via SUBCUTANEOUS
  Administered 2015-05-11: 4 [IU] via SUBCUTANEOUS
  Administered 2015-05-12 (×2): 7 [IU] via SUBCUTANEOUS
  Administered 2015-05-12 (×2): 4 [IU] via SUBCUTANEOUS

## 2015-05-09 MED ORDER — ENOXAPARIN SODIUM 30 MG/0.3ML ~~LOC~~ SOLN
30.0000 mg | Freq: Two times a day (BID) | SUBCUTANEOUS | Status: DC
Start: 2015-05-10 — End: 2015-05-12
  Administered 2015-05-10 – 2015-05-12 (×5): 30 mg via SUBCUTANEOUS
  Filled 2015-05-09 (×8): qty 0.3

## 2015-05-09 MED ORDER — HEPARIN SODIUM (PORCINE) 5000 UNIT/ML IJ SOLN
5000.0000 [IU] | INTRAMUSCULAR | Status: AC
  Administered 2015-05-09: 5000 [IU] via SUBCUTANEOUS
  Filled 2015-05-09: qty 1

## 2015-05-09 MED ORDER — PROPOFOL 10 MG/ML IV BOLUS
INTRAVENOUS | Status: AC
  Filled 2015-05-09: qty 20

## 2015-05-09 MED ORDER — CHLORHEXIDINE GLUCONATE 4 % EX LIQD: 60.0000 mL | Freq: Once | CUTANEOUS | Status: DC

## 2015-05-09 MED ORDER — PROMETHAZINE HCL 25 MG/ML IJ SOLN: 12.5000 mg | Freq: Four times a day (QID) | INTRAMUSCULAR | Status: DC | PRN

## 2015-05-09 MED ORDER — ONDANSETRON HCL 4 MG/2ML IJ SOLN
4.0000 mg | INTRAMUSCULAR | Status: DC | PRN
  Administered 2015-05-09 – 2015-05-11 (×4): 4 mg via INTRAVENOUS
  Filled 2015-05-09 (×4): qty 2

## 2015-05-09 MED ORDER — CEFOTETAN DISODIUM-DEXTROSE 2-2.08 GM-% IV SOLR
INTRAVENOUS | Status: AC
  Filled 2015-05-09: qty 50

## 2015-05-09 MED ORDER — EVICEL 5 ML EX KIT
PACK | Freq: Once | CUTANEOUS | Status: AC
  Administered 2015-05-09: 1
  Filled 2015-05-09: qty 1

## 2015-05-09 MED ORDER — LABETALOL HCL 5 MG/ML IV SOLN
5.0000 mg | INTRAVENOUS | Status: DC | PRN
  Administered 2015-05-09 (×2): 5 mg via INTRAVENOUS

## 2015-05-09 MED ORDER — METOCLOPRAMIDE HCL 5 MG/ML IJ SOLN
INTRAMUSCULAR | Status: DC | PRN
  Administered 2015-05-09: 10 mg via INTRAVENOUS

## 2015-05-09 MED ORDER — METOCLOPRAMIDE HCL 5 MG/ML IJ SOLN
INTRAMUSCULAR | Status: AC
  Filled 2015-05-09: qty 2

## 2015-05-09 MED ORDER — PANTOPRAZOLE SODIUM 40 MG IV SOLR
40.0000 mg | Freq: Every day | INTRAVENOUS | Status: DC
Start: 2015-05-09 — End: 2015-05-12
  Administered 2015-05-09 – 2015-05-11 (×3): 40 mg via INTRAVENOUS
  Filled 2015-05-09 (×4): qty 40

## 2015-05-09 MED ORDER — GLYCOPYRROLATE 0.2 MG/ML IJ SOLN
INTRAMUSCULAR | Status: DC | PRN
  Administered 2015-05-09: 0.6 mg via INTRAVENOUS

## 2015-05-09 MED ORDER — INSULIN REGULAR BOLUS VIA INFUSION
0.0000 [IU] | Freq: Three times a day (TID) | INTRAVENOUS | Status: DC | PRN
  Filled 2015-05-09: qty 10

## 2015-05-09 MED ORDER — PHENYLEPHRINE HCL 10 MG/ML IJ SOLN
INTRAMUSCULAR | Status: DC | PRN
  Administered 2015-05-09: 40 ug via INTRAVENOUS
  Administered 2015-05-09 (×2): 120 ug via INTRAVENOUS
  Administered 2015-05-09: 40 ug via INTRAVENOUS

## 2015-05-09 MED ORDER — ACETAMINOPHEN 10 MG/ML IV SOLN
INTRAVENOUS | Status: AC
  Filled 2015-05-09: qty 100

## 2015-05-09 MED ORDER — PROMETHAZINE HCL 25 MG/ML IJ SOLN
INTRAMUSCULAR | Status: AC
  Filled 2015-05-09: qty 1

## 2015-05-09 MED ORDER — INSULIN ASPART 100 UNIT/ML ~~LOC~~ SOLN
0.0000 [IU] | SUBCUTANEOUS | Status: DC
Start: 2015-05-09 — End: 2015-05-09
  Administered 2015-05-09: 16 [IU] via SUBCUTANEOUS
  Administered 2015-05-09: 8 [IU] via SUBCUTANEOUS

## 2015-05-09 MED ORDER — ACETAMINOPHEN 10 MG/ML IV SOLN
1000.0000 mg | Freq: Once | INTRAVENOUS | Status: AC
  Administered 2015-05-09: 1000 mg via INTRAVENOUS

## 2015-05-09 MED ORDER — FENTANYL CITRATE (PF) 250 MCG/5ML IJ SOLN
INTRAMUSCULAR | Status: AC
  Filled 2015-05-09: qty 25

## 2015-05-09 MED ORDER — ONDANSETRON HCL 4 MG/2ML IJ SOLN
INTRAMUSCULAR | Status: AC
  Filled 2015-05-09: qty 2

## 2015-05-09 MED ORDER — NEOSTIGMINE METHYLSULFATE 10 MG/10ML IV SOLN
INTRAVENOUS | Status: DC | PRN
  Administered 2015-05-09: 5 mg via INTRAVENOUS

## 2015-05-09 MED ORDER — MORPHINE SULFATE (PF) 2 MG/ML IV SOLN
2.0000 mg | INTRAVENOUS | Status: DC | PRN
  Administered 2015-05-09 (×2): 2 mg via INTRAVENOUS
  Administered 2015-05-10: 4 mg via INTRAVENOUS
  Administered 2015-05-10 – 2015-05-11 (×3): 2 mg via INTRAVENOUS
  Filled 2015-05-09 (×2): qty 1
  Filled 2015-05-09: qty 2
  Filled 2015-05-09 (×3): qty 1

## 2015-05-09 MED ORDER — MIDAZOLAM HCL 2 MG/2ML IJ SOLN
INTRAMUSCULAR | Status: AC
  Filled 2015-05-09: qty 4

## 2015-05-09 MED ORDER — MEPERIDINE HCL 50 MG/ML IJ SOLN: 6.2500 mg | INTRAMUSCULAR | Status: DC | PRN

## 2015-05-09 MED ORDER — LACTATED RINGERS IV SOLN
INTRAVENOUS | Status: DC
  Administered 2015-05-09: 16:00:00 via INTRAVENOUS
  Administered 2015-05-09: 1000 mL via INTRAVENOUS

## 2015-05-09 MED ORDER — SUCCINYLCHOLINE CHLORIDE 20 MG/ML IJ SOLN
INTRAMUSCULAR | Status: DC | PRN
  Administered 2015-05-09: 100 mg via INTRAVENOUS

## 2015-05-09 MED ORDER — NEOSTIGMINE METHYLSULFATE 10 MG/10ML IV SOLN
INTRAVENOUS | Status: AC
  Filled 2015-05-09: qty 1

## 2015-05-09 MED ORDER — UNJURY CHOCOLATE CLASSIC POWDER
2.0000 [oz_av] | Freq: Four times a day (QID) | ORAL | Status: DC
  Administered 2015-05-12: 2 [oz_av] via ORAL

## 2015-05-09 MED ORDER — LABETALOL HCL 5 MG/ML IV SOLN
INTRAVENOUS | Status: AC
  Filled 2015-05-09: qty 4

## 2015-05-09 MED ORDER — DEXAMETHASONE SODIUM PHOSPHATE 10 MG/ML IJ SOLN
INTRAMUSCULAR | Status: AC
  Filled 2015-05-09: qty 1

## 2015-05-09 MED ORDER — OXYCODONE HCL 5 MG/5ML PO SOLN
5.0000 mg | ORAL | Status: DC | PRN
  Administered 2015-05-10 – 2015-05-11 (×3): 5 mg via ORAL
  Filled 2015-05-09 (×3): qty 5

## 2015-05-09 MED ORDER — SODIUM CHLORIDE 0.9 % IV SOLN
INTRAVENOUS | Status: DC | PRN
  Filled 2015-05-09: qty 2.5

## 2015-05-09 MED ORDER — MIDAZOLAM HCL 5 MG/5ML IJ SOLN
INTRAMUSCULAR | Status: DC | PRN
  Administered 2015-05-09: 2 mg via INTRAVENOUS

## 2015-05-09 MED ORDER — DEXAMETHASONE SODIUM PHOSPHATE 4 MG/ML IJ SOLN: INTRAMUSCULAR | Status: DC | PRN

## 2015-05-09 MED ORDER — FENTANYL CITRATE (PF) 100 MCG/2ML IJ SOLN
INTRAMUSCULAR | Status: AC
  Filled 2015-05-09: qty 4

## 2015-05-09 MED ORDER — PHENYLEPHRINE 40 MCG/ML (10ML) SYRINGE FOR IV PUSH (FOR BLOOD PRESSURE SUPPORT)
PREFILLED_SYRINGE | INTRAVENOUS | Status: AC
  Filled 2015-05-09: qty 10

## 2015-05-09 MED ORDER — POTASSIUM CHLORIDE IN NACL 20-0.45 MEQ/L-% IV SOLN
INTRAVENOUS | Status: DC
Start: 2015-05-09 — End: 2015-05-10
  Administered 2015-05-09 – 2015-05-10 (×2): via INTRAVENOUS
  Filled 2015-05-09 (×7): qty 1000

## 2015-05-09 MED ORDER — ACETAMINOPHEN 160 MG/5ML PO SOLN: 325.0000 mg | ORAL | Status: DC | PRN

## 2015-05-09 MED ORDER — GLYCOPYRROLATE 0.2 MG/ML IJ SOLN
INTRAMUSCULAR | Status: AC
  Filled 2015-05-09: qty 3

## 2015-05-09 MED ORDER — ROCURONIUM BROMIDE 100 MG/10ML IV SOLN
INTRAVENOUS | Status: DC | PRN
  Administered 2015-05-09: 30 mg via INTRAVENOUS
  Administered 2015-05-09: 20 mg via INTRAVENOUS
  Administered 2015-05-09: 10 mg via INTRAVENOUS
  Administered 2015-05-09 (×2): 20 mg via INTRAVENOUS
  Administered 2015-05-09: 10 mg via INTRAVENOUS
  Administered 2015-05-09: 5 mg via INTRAVENOUS

## 2015-05-09 MED ORDER — ROCURONIUM BROMIDE 100 MG/10ML IV SOLN
INTRAVENOUS | Status: AC
  Filled 2015-05-09: qty 1

## 2015-05-09 MED ORDER — LACTATED RINGERS IV SOLN: INTRAVENOUS | Status: DC

## 2015-05-09 MED ORDER — SODIUM CHLORIDE 0.9 % IJ SOLN
INTRAMUSCULAR | Status: DC | PRN
  Administered 2015-05-09: 50 mL

## 2015-05-09 MED ORDER — HYDROMORPHONE HCL 1 MG/ML IJ SOLN
INTRAMUSCULAR | Status: AC
  Filled 2015-05-09: qty 1

## 2015-05-09 MED ORDER — STERILE WATER FOR IRRIGATION IR SOLN
Status: DC | PRN
  Administered 2015-05-09: 1000 mL

## 2015-05-09 MED ORDER — UNJURY CHICKEN SOUP POWDER
2.0000 [oz_av] | Freq: Four times a day (QID) | ORAL | Status: DC
  Administered 2015-05-11 – 2015-05-12 (×3): 2 [oz_av] via ORAL

## 2015-05-09 MED ORDER — FENTANYL CITRATE (PF) 100 MCG/2ML IJ SOLN
INTRAMUSCULAR | Status: DC | PRN
  Administered 2015-05-09 (×3): 50 ug via INTRAVENOUS
  Administered 2015-05-09: 25 ug via INTRAVENOUS
  Administered 2015-05-09 (×2): 50 ug via INTRAVENOUS
  Administered 2015-05-09: 100 ug via INTRAVENOUS
  Administered 2015-05-09 (×2): 50 ug via INTRAVENOUS

## 2015-05-09 MED ORDER — CEFOTETAN DISODIUM-DEXTROSE 2-2.08 GM-% IV SOLR
2.0000 g | INTRAVENOUS | Status: AC
  Administered 2015-05-09: 2 g via INTRAVENOUS

## 2015-05-09 MED ORDER — HYDROMORPHONE HCL 1 MG/ML IJ SOLN
0.2500 mg | INTRAMUSCULAR | Status: DC | PRN
  Administered 2015-05-09: 0.5 mg via INTRAVENOUS

## 2015-05-09 SURGICAL SUPPLY — 76 items
ADH SKN CLS APL DERMABOND .7 (GAUZE/BANDAGES/DRESSINGS) ×1
APL SRG 32X5 SNPLK LF DISP (MISCELLANEOUS) ×1
APPLICATOR COTTON TIP 6IN STRL (MISCELLANEOUS) ×4 IMPLANT
BLADE SURG SZ11 CARB STEEL (BLADE) ×2 IMPLANT
CABLE HIGH FREQUENCY MONO STRZ (ELECTRODE) IMPLANT
CHLORAPREP W/TINT 26ML (MISCELLANEOUS) ×4 IMPLANT
CLIP SUT LAPRA TY ABSORB (SUTURE) ×4 IMPLANT
COVER SURGICAL LIGHT HANDLE (MISCELLANEOUS) ×2 IMPLANT
CUTTER FLEX LINEAR 45M (STAPLE) IMPLANT
DERMABOND ADVANCED (GAUZE/BANDAGES/DRESSINGS) ×1
DERMABOND ADVANCED .7 DNX12 (GAUZE/BANDAGES/DRESSINGS) IMPLANT
DEVICE SUT QUICK LOAD TK 5 (STAPLE) IMPLANT
DEVICE SUT TI-KNOT TK 5X26 (MISCELLANEOUS) IMPLANT
DEVICE SUTURE ENDOST 10MM (ENDOMECHANICALS) ×2 IMPLANT
DRAIN PENROSE 18X1/4 LTX STRL (WOUND CARE) ×2 IMPLANT
DRAPE CAMERA CLOSED 9X96 (DRAPES) ×2 IMPLANT
ELECT REM PT RETURN 9FT ADLT (ELECTROSURGICAL) ×2
ELECTRODE REM PT RTRN 9FT ADLT (ELECTROSURGICAL) ×1 IMPLANT
GAUZE SPONGE 4X4 12PLY STRL (GAUZE/BANDAGES/DRESSINGS) IMPLANT
GAUZE SPONGE 4X4 16PLY XRAY LF (GAUZE/BANDAGES/DRESSINGS) ×2 IMPLANT
GLOVE BIOGEL M STRL SZ7.5 (GLOVE) IMPLANT
GOWN STRL REUS W/TWL XL LVL3 (GOWN DISPOSABLE) ×8 IMPLANT
HOVERMATT SINGLE USE (MISCELLANEOUS) ×2 IMPLANT
KIT BASIN OR (CUSTOM PROCEDURE TRAY) ×2 IMPLANT
KIT GASTRIC LAVAGE 34FR ADT (SET/KITS/TRAYS/PACK) ×2 IMPLANT
LUBRICANT JELLY K Y 4OZ (MISCELLANEOUS) ×2 IMPLANT
NDL SPNL 22GX3.5 QUINCKE BK (NEEDLE) ×1 IMPLANT
NEEDLE SPNL 22GX3.5 QUINCKE BK (NEEDLE) ×2 IMPLANT
PACK CARDIOVASCULAR III (CUSTOM PROCEDURE TRAY) ×2 IMPLANT
PEN SKIN MARKING BROAD (MISCELLANEOUS) ×2 IMPLANT
RELOAD 45 VASCULAR/THIN (ENDOMECHANICALS) IMPLANT
RELOAD ENDO STITCH 2.0 (ENDOMECHANICALS) ×18
RELOAD STAPLE 45 2.5 WHT GRN (ENDOMECHANICALS) IMPLANT
RELOAD STAPLE 45 3.5 BLU ETS (ENDOMECHANICALS) IMPLANT
RELOAD STAPLE 60 2.6 WHT THN (STAPLE) IMPLANT
RELOAD STAPLE 60 3.6 BLU REG (STAPLE) ×2 IMPLANT
RELOAD STAPLE 60 3.8 GOLD REG (STAPLE) ×1 IMPLANT
RELOAD STAPLE TA45 3.5 REG BLU (ENDOMECHANICALS) ×6 IMPLANT
RELOAD STAPLER BLUE 60MM (STAPLE) ×4 IMPLANT
RELOAD STAPLER GOLD 60MM (STAPLE) ×2 IMPLANT
RELOAD STAPLER WHITE 60MM (STAPLE) IMPLANT
RELOAD SUT SNGL STCH ABSRB 2-0 (ENDOMECHANICALS) ×4 IMPLANT
RELOAD SUT SNGL STCH BLK 2-0 (ENDOMECHANICALS) ×4 IMPLANT
SCISSORS LAP 5X45 EPIX DISP (ENDOMECHANICALS) ×2 IMPLANT
SEALANT SURGICAL APPL DUAL CAN (MISCELLANEOUS) ×2 IMPLANT
SET IRRIG TUBING LAPAROSCOPIC (IRRIGATION / IRRIGATOR) ×2 IMPLANT
SHEARS HARMONIC ACE PLUS 45CM (MISCELLANEOUS) ×2 IMPLANT
SLEEVE ADV FIXATION 12X100MM (TROCAR) ×4 IMPLANT
SLEEVE XCEL OPT CAN 5 100 (ENDOMECHANICALS) IMPLANT
SOLUTION ANTI FOG 6CC (MISCELLANEOUS) ×2 IMPLANT
STAPLER ECHELON BIOABSB 60 FLE (MISCELLANEOUS) IMPLANT
STAPLER ECHELON LONG 60 440 (INSTRUMENTS) ×2 IMPLANT
STAPLER RELOAD BLUE 60MM (STAPLE) ×8
STAPLER RELOAD GOLD 60MM (STAPLE) ×4
STAPLER RELOAD WHITE 60MM (STAPLE)
SUT MNCRL AB 4-0 PS2 18 (SUTURE) ×2 IMPLANT
SUT RELOAD ENDO STITCH 2 48X1 (ENDOMECHANICALS) ×5
SUT RELOAD ENDO STITCH 2.0 (ENDOMECHANICALS) ×4
SUT SURGIDAC NAB ES-9 0 48 120 (SUTURE) IMPLANT
SUT VIC AB 2-0 SH 27 (SUTURE) ×4
SUT VIC AB 2-0 SH 27X BRD (SUTURE) ×1 IMPLANT
SUTURE RELOAD END STTCH 2 48X1 (ENDOMECHANICALS) ×5 IMPLANT
SUTURE RELOAD ENDO STITCH 2.0 (ENDOMECHANICALS) ×4 IMPLANT
SYR 10ML ECCENTRIC (SYRINGE) ×2 IMPLANT
SYR 20CC LL (SYRINGE) ×4 IMPLANT
TOWEL OR 17X26 10 PK STRL BLUE (TOWEL DISPOSABLE) ×2 IMPLANT
TOWEL OR NON WOVEN STRL DISP B (DISPOSABLE) ×2 IMPLANT
TRAY FOLEY W/METER SILVER 14FR (SET/KITS/TRAYS/PACK) ×2 IMPLANT
TRAY FOLEY W/METER SILVER 16FR (SET/KITS/TRAYS/PACK) ×2 IMPLANT
TROCAR ADV FIXATION 12X100MM (TROCAR) ×2 IMPLANT
TROCAR ADV FIXATION 5X100MM (TROCAR) ×2 IMPLANT
TROCAR BLADELESS OPT 5 100 (ENDOMECHANICALS) ×2 IMPLANT
TROCAR XCEL 12X100 BLDLESS (ENDOMECHANICALS) ×2 IMPLANT
TUBING CONNECTING 10 (TUBING) IMPLANT
TUBING ENDO SMARTCAP PENTAX (MISCELLANEOUS) ×2 IMPLANT
TUBING FILTER THERMOFLATOR (ELECTROSURGICAL) ×2 IMPLANT

## 2015-05-09 NOTE — Op Note (Signed)
Upper GI endoscopy is performed at the completion of laparoscopic Roux-en-Y gastric bypass by Dr. Redmond Pulling. The Olympus video endoscope was inserted into the upper esophagus and then passed under direct vision to the EG junction. The small gastric pouch was insufflated with air while the gastric outlet was clamped under irrigation by the operating surgeon. Initially bubbles were detected at the anastamosis.  After several sutures were placed at this site the pouch was re insufflated with no evidence of leak. The anastomosis was visualized and was patent. Suture and staple lines were intact and without bleeding. The pouch was tubular and measured 4-5 cm in length. At the completion of the procedure the pouch was desufflated and the scope withdrawn.

## 2015-05-09 NOTE — Transfer of Care (Signed)
Immediate Anesthesia Transfer of Care Note  Patient: Donna Kidd  Procedure(s) Performed: Procedure(s): LAPAROSCOPIC ROUX-EN-Y GASTRIC BYPASS WITH UPPER ENDOSCOPY (N/A)  Patient Location: PACU  Anesthesia Type:General  Level of Consciousness: Patient easily awoken, sedated, comfortable, cooperative, following commands, responds to stimulation.   Airway & Oxygen Therapy: Patient spontaneously breathing, ventilating well, oxygen via simple oxygen mask.  Post-op Assessment: Report given to PACU RN, vital signs reviewed and stable, moving all extremities.   Post vital signs: Reviewed and stable.  Complications: No apparent anesthesia complications

## 2015-05-09 NOTE — Anesthesia Procedure Notes (Signed)
Procedure Name: Intubation Date/Time: 05/09/2015 11:23 AM Performed by: Deliah Boston Pre-anesthesia Checklist: Patient identified, Emergency Drugs available, Suction available and Patient being monitored Patient Re-evaluated:Patient Re-evaluated prior to inductionOxygen Delivery Method: Circle System Utilized Preoxygenation: Pre-oxygenation with 100% oxygen Intubation Type: IV induction Ventilation: Mask ventilation without difficulty Laryngoscope Size: Mac and 3 Grade View: Grade I Tube type: Oral Tube size: 7.5 mm Number of attempts: 1 Airway Equipment and Method: Stylet and Oral airway Placement Confirmation: ETT inserted through vocal cords under direct vision,  positive ETCO2 and breath sounds checked- equal and bilateral Secured at: 20 cm Tube secured with: Tape Dental Injury: Teeth and Oropharynx as per pre-operative assessment

## 2015-05-09 NOTE — H&P (View-Only) (Signed)
Donna Kidd. Donna Kidd 04/28/2015 4:00 PM Location: [facility] Patient #: [id number] DOB: Jan 04, 1966 Single / Language: [place] / Race: Black or African American, White Female History of Present Illness Donna Kidd; 05/06/2015 1:22 PM) The patient is a 49 year old female who presents for a pre-op visit. She comes in today for her preoperative visit. She is currently been approved for laparoscopic Roux-en-Y gastric bypass on September 12. I initially met her on May 13. Her weight at time was 309 pounds. She denies any medical changes since she was last seen. She did have some menses changes and underwent a biopsy which was negative. Otherwise she has no new complaints. Her upper GI was within normal limits. Her chest x-ray and EKG were also normal. Her CBC showed a mild anemia with an 11.8 hemoglobin, hematocrit 36.9. Her H. pylori was negative. Her vitamin D, B12, folate, and iron levels were within normal limits. She got cardiac clearance from Dr. Delton See. Dr. [name] manages her diabetes. She denies any chest pain, chest pressure, shortness of breath, TIAs or amaurosis fugax. 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 Tonsillectomy  Allergies Fay Records, CMA; 04/28/2015 4:08 PM) No Known Drug Allergies 01/07/2015  Medication History Atilano Ina, Donna Kidd; 05/06/2015 1:24 PM) Atorvastatin Calcium (40MG  Tablet, Oral) Active. Gabapentin (300MG  Capsule, Oral) Active. Lisinopril (20MG  Tablet,  Oral) Active. NovoLOG FlexPen (100UNIT/ML Soln Pen-inj, Subcutaneous) Active. Medications Reconciled OxyCODONE HCl (5MG /5ML Solution, 5-10 Milliliter Oral every four hours, as needed, Taken starting 04/28/2015) Active. Ondansetron (4MG  Tablet Disperse, 1 (one) Tablet Disperse Oral every six hours, as needed, Taken starting 04/28/2015) Active. Protonix (40MG  Tablet DR, 1 (one) Tablet DR Oral daily, Taken starting 04/28/2015) Active.  Social History Atilano Ina, Donna Kidd; 05/06/2015 1:24 PM) No drug use Tobacco use Never smoker.  Family History Atilano Ina, Donna Kidd; 05/06/2015 1:24 PM) Melanoma Father. Respiratory Condition Father. Thyroid problems Mother. Breast Cancer Family Members In General. Cerebrovascular Accident Mother. Alcohol Abuse Sister. Cervical Cancer Family Members In General. Hypertension Family Members In General, Father. Kidney Disease Family Members In General. Heart Disease Family Members In General, Father. Depression Father. Diabetes Mellitus Brother, Family Members In General, Father, Mother.  Pregnancy / Birth History Atilano Ina, Donna Kidd; 05/06/2015 1:24 PM) Irregular periods Maternal age 64-25     Review of Systems Atilano Ina, Donna Kidd; 05/06/2015 1:20 00) General Present- Fatigue, Night Sweats and Weight Gain. Not Present- Appetite Loss, Chills, Fever and Weight Loss. Skin Present- Non-Healing Wounds. Not Present- Change in Wart/Mole, Dryness, Hives, Jaundice, New Lesions, Rash and Ulcer. HEENT Present- Wears glasses/contact lenses. Not Present- Earache, Hearing Loss, Hoarseness, Nose Bleed, Oral Ulcers, Ringing in the Ears, Seasonal Allergies, Sinus Pain, Sore Throat, Visual Disturbances and Yellow Eyes. Respiratory Present- Snoring. Not Present- Bloody sputum, Chronic Cough, Difficulty Breathing and Wheezing. Cardiovascular Present- Shortness of Breath. Not Present- Chest Pain, Difficulty Breathing Lying Down, Leg Cramps, Palpitations, Rapid Heart  Rate and Swelling of Extremities. Gastrointestinal Present- Vomiting. Not Present- Abdominal Pain, Bloating, Bloody Stool, Change in Bowel Habits, Chronic diarrhea, Constipation, Difficulty Swallowing, Excessive gas, Gets  full quickly at meals, Hemorrhoids, Indigestion, Nausea and Rectal Pain. Female Genitourinary Present- Frequency and Nocturia. Not Present- Painful Urination, Pelvic Pain and Urgency. Musculoskeletal Present- Back Pain, Joint Stiffness and Swelling of Extremities. Not Present- Joint Pain, Muscle Pain and Muscle Weakness. Neurological Present- Numbness and Trouble walking. Not Present- Decreased Memory, Fainting, Headaches, Seizures, Tingling, Tremor and Weakness. Psychiatric Not Present- Anxiety, Bipolar, Change in Sleep Pattern, Depression, Fearful and Frequent crying. Endocrine Not Present- Cold Intolerance, Excessive Hunger, Hair Changes, Heat Intolerance, Hot flashes and New Diabetes. Hematology Not Present- Easy Bruising, Excessive bleeding, Gland problems, HIV and Persistent Infections.  Vitals Fay Records CMA; 04/28/2015 4:08 PM) 04/28/2015 4:08 PM Weight: 303.6 lb Height: 68in Body Surface Area: 2.57 m Body Mass Index: 46.16 kg/m Temp.: 97.66F(Temporal)  Pulse: 98 (Regular)  BP: 138/78 (Sitting, Left Arm, Standard)     Physical Exam Donna Kidd; 05/06/2015 1:20 PM)  General Mental Status-Alert. General Appearance-Consistent with stated age. Hydration-Well hydrated. Voice-Normal. Note: morbidly obese   Head and Neck Head-normocephalic, atraumatic with no lesions or palpable masses. Trachea-midline. Thyroid Gland Characteristics - normal size and consistency.  Eye Eyeball - Bilateral-Extraocular movements intact. Sclera/Conjunctiva - Bilateral-No scleral icterus.  Chest and Lung Exam Chest and lung exam reveals -quiet, even and easy respiratory effort with no use of accessory muscles and on auscultation, normal breath  sounds, no adventitious sounds and normal vocal resonance. Inspection Chest Wall - Normal. Back - normal.  Breast - Did not examine.  Cardiovascular Cardiovascular examination reveals -normal heart sounds, regular rate and rhythm with no murmurs and normal pedal pulses bilaterally.  Abdomen Inspection Inspection of the abdomen reveals - No Hernias. Skin - Scar - no surgical scars. Palpation/Percussion Palpation and Percussion of the abdomen reveal - Soft, Non Tender, No Rebound tenderness, No Rigidity (guarding) and No hepatosplenomegaly. Auscultation Auscultation of the abdomen reveals - Bowel sounds normal.  Peripheral Vascular Upper Extremity Palpation - Pulses bilaterally normal.  Neurologic Neurologic evaluation reveals -alert and oriented x 3 with no impairment of recent or remote memory. Mental Status-Normal.  Neuropsychiatric The patient's mood and affect are described as -normal. Judgment and Insight-insight is appropriate concerning matters relevant to self.  Musculoskeletal Normal Exam - Left-Upper Extremity Strength Normal and Lower Extremity Strength Normal. Normal Exam - Right-Upper Extremity Strength Normal and Lower Extremity Strength Normal. Note: Left foot in hard boot   Lymphatic Head & Neck  General Head & Neck Lymphatics: Bilateral - Description - Normal. Axillary - Did not examine. Femoral & Inguinal - Did not examine.    Assessment & Plan Donna Kidd; 05/06/2015 1:23 PM)  OBESITY, MORBID, BMI 40.0-49.9 (E66.01) Impression: We reviewed her preoperative workup. We discussed her mild anemia. We discussedthe results of her upper GI. We discussed the typical hospitalization course. We also discussed the typical postoperative recovery course. We discussed the importance of following up with Dr. [name] postoperatively regarding her diabetes. All of her questions were asked and answered. She was given her postoperative prescriptions  for pain medication, nausea, and heartburn. She is also given instructions for her postoperative bowel prep  Current Plans Pt Education - EMW_preopbariatric Started OxyCODONE HCl 5MG /5ML, 5-10 Milliliter every four hours, as needed, 200 Milliliter, 04/28/2015, No Refill. Started Ondansetron 4MG , 1 (one) Tablet Disperse every six hours, as needed, #20, 04/28/2015, No Refill. Started Protonix 40MG , 1 (one) Tablet DR daily, #30, 04/28/2015, No Refill. 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 Andrey Campanile, Donna Kidd, FACS General, Bariatric, & Minimally Invasive Surgery [facility], [place]

## 2015-05-09 NOTE — Anesthesia Postprocedure Evaluation (Signed)
  Anesthesia Post-op Note  Patient: Donna Kidd  Procedure(s) Performed: Procedure(s): LAPAROSCOPIC ROUX-EN-Y GASTRIC BYPASS WITH UPPER ENDOSCOPY (N/A)  Patient Location: PACU  Anesthesia Type:General  Level of Consciousness: awake, alert  and oriented  Airway and Oxygen Therapy: Patient Spontanous Breathing  Post-op Pain: mild  Post-op Assessment: Post-op Vital signs reviewed and Patient's Cardiovascular Status Stable              Post-op Vital Signs: Reviewed and stable  Last Vitals:  Filed Vitals:   05/09/15 1545  BP: 189/78  Pulse: 79  Temp:   Resp: 25    Complications: No apparent anesthesia complications

## 2015-05-09 NOTE — Anesthesia Preprocedure Evaluation (Addendum)
Anesthesia Evaluation  Patient identified by MRN, date of birth, ID band Patient awake    Reviewed: Allergy & Precautions, NPO status , Patient's Chart, lab work & pertinent test results  Airway Mallampati: II  TM Distance: >3 FB Neck ROM: Full    Dental  (+) Teeth Intact   Pulmonary neg pulmonary ROS,    breath sounds clear to auscultation       Cardiovascular hypertension, Pt. on medications + Peripheral Vascular Disease   Rhythm:Regular Rate:Normal     Neuro/Psych negative neurological ROS  negative psych ROS   GI/Hepatic negative GI ROS, Neg liver ROS,   Endo/Other  diabetes, Type 2, Insulin Dependent  Renal/GU negative Renal ROS  negative genitourinary   Musculoskeletal  (+) Arthritis , Osteoarthritis,    Abdominal   Peds negative pediatric ROS (+)  Hematology   Anesthesia Other Findings   Reproductive/Obstetrics negative OB ROS                            Lab Results  Component Value Date   WBC 9.5 05/04/2015   HGB 12.1 05/04/2015   HCT 37.0 05/04/2015   MCV 89.8 05/04/2015   PLT 295 05/04/2015   Lab Results  Component Value Date   CREATININE 0.82 05/04/2015   BUN 34* 05/04/2015   NA 138 05/04/2015   K 5.1 05/04/2015   CL 102 05/04/2015   CO2 22 05/04/2015   EKG: normal EKG, normal sinus rhythm.  Echo 2015: Left ventricle: The cavity size was normal. Systolic function was normal. The estimated ejection fraction was in the range of 60% to 65%. Wall motion was normal; there were no regional wall motion abnormalities.  Myocardial Perfusion Imaging Normal   Anesthesia Physical Anesthesia Plan  ASA: III  Anesthesia Plan: General   Post-op Pain Management:    Induction: Intravenous  Airway Management Planned: Oral ETT  Additional Equipment:   Intra-op Plan:   Post-operative Plan: Extubation in OR  Informed Consent: I have reviewed the patients History  and Physical, chart, labs and discussed the procedure including the risks, benefits and alternatives for the proposed anesthesia with the patient or authorized representative who has indicated his/her understanding and acceptance.   Dental advisory given  Plan Discussed with: CRNA  Anesthesia Plan Comments:         Anesthesia Quick Evaluation

## 2015-05-09 NOTE — Op Note (Signed)
Donna Kidd [id number] August 19, 1966. 05/09/2015  Preoperative diagnosis:    Morbid obesity with BMI of 46, adult   Essential hypertension, benign   Hyperlipidemia with target LDL less than 100   Type 2 diabetes, uncontrolled, with neuropathy   Charcot foot due to diabetes mellitus   Diabetic retinopathy   Postoperative  diagnosis:  1. same  Surgical procedure: Laparoscopic Roux-en-Y gastric bypass (ante-colic, ante-gastric); upper endoscopy  Surgeon: Gayland Curry, M.D. FACS  Asst.: Excell Seltzer MD FACS  Anesthesia: General plus 66YQ exparel  Complications: None   EBL: Minimal   Drains: None   Disposition: PACU in good condition   Indications for procedure: 49yo WF with morbid obesity who has been unsuccessful at sustained weight loss. The patient's comorbidities are listed above. We discussed the risk and benefits of surgery including but not limited to anesthesia risk, bleeding, infection, blood clot formation, anastomotic leak, anastomotic stricture, ulcer formation, death, respiratory complications, intestinal blockage, internal hernia, gallstone formation, vitamin and nutritional deficiencies, injury to surrounding structures, failure to lose weight and mood changes.   Description of procedure: Patient is brought to the operating room and general anesthesia induced. The patient had received preoperative broad-spectrum IV antibiotics and subcutaneous heparin. The abdomen was widely sterilely prepped with Chloraprep and draped. Patient timeout was performed and correct patient and procedure confirmed. Access was obtained with a 12 mm Optiview trocar in the left upper quadrant and pneumoperitoneum established without difficulty. Under direct vision 12 mm trocars were placed laterally in the right upper quadrant, right upper quadrant midclavicular line, and to the left and above the umbilicus for the camera port. A 5 mm trocar was placed laterally in the left upper quadrant.   The omentum was brought into the upper abdomen and the transverse mesocolon elevated and the ligament of Treitz clearly identified. A 40 cm biliopancreatic limb was then carefully measured from the ligament of Treitz. The small intestine was divided at this point with a single firing of the white load linear stapler. A Penrose drain was sutured to the end of the Roux-en-Y limb for later identification. A 100 cm Roux-en-Y limb was then carefully measured. At this point a side-to-side anastomosis was created between the Roux limb and the end of the biliopancreatic limb. This was accomplished with a single firing of the 60 mm white load linear stapler. The common enterotomy was closed with a running 2-0 Vicryl begun at either end of the enterotomy and tied centrally. Evicel tissue sealant was placed over the anastomosis. The mesenteric defect was then closed with running 2-0 silk. The omentum was then divided with the harmonic scalpel up towards the transverse colon to allow mobility of the Roux limb toward the gastric pouch. The patient was then placed in steep reversed Trendelenburg. Through a 5 mm subxiphoid site the [facility] retractor was placed and the left lobe of the liver elevated with excellent exposure of the upper stomach and hiatus. The angle of Hiss was then mobilized with the harmonic scalpel. A 5 cm gastric pouch was then carefully measured along the lesser curve of the stomach. Dissection was carried along the lesser curve at this point with the Harmonic scalpel working carefully back toward the lesser sac at right angles to the lesser curve. The free lesser sac was then entered. After being sure all tubes were removed from the stomach an initial firing of the gold load 60 mm linear stapler was fired at right angles across the lesser curve for about 4 cm.  The gastric pouch was further mobilized posteriorly and then the pouch was completed with 3 further firings of the 60 mm blue load linear stapler up  through the previously dissected angle of His. It was ensured that the pouch was completely mobilized away from the gastric remnant. This created a nice tubular 4-5 cm gastric pouch. The Roux limb was then brought up in an antecolic fashion with the candycane facing to the patient's left without undue tension. The gastrojejunostomy was created with an initial posterior row of 2-0 Vicryl between the Roux limb and the staple line of the gastric pouch. Enterotomies were then made in the gastric pouch and the Roux limb with the harmonic scalpel and at approximately 2-2-1/2 cm anastomosis was created with a single firing of the 5mm blue load linear stapler. The staple line was inspected and was intact without bleeding. The common enterotomy was then closed with running 2-0 Vicryl begun at either end and tied centrally. The Ewall tube was then easily passed through the anastomosis and an outer anterior layer of running 2-0 Vicryl was placed. The Ewald tube was removed. With the outlet of the gastrojejunostomy clamped and under saline irrigation the assistant performed upper endoscopy and with the gastric pouch tensely distended with air-there was evidence of a leak on this test at the end of the pouch/crotch on pt's right side of the pouch. I placed a figure of eight 2-0 vicryl suture between the roux limb and gastric pouch in this location. The pouch was reinsuffulated at this point and there were still air bubbles coming from that end. Dr Excell Seltzer scrubbed back in. We rotated the anastomosis slightly to the left to exposed the pt's right corner of the gastrojejunostomy. There was no hole/gap in the anastomosis. I placed 3 interrupted 2-0 vicryl sutures using the endostitch between the roux limb and pouch in this lateral corner. Dr Excell Seltzer scrubbed out and resinsuffulated the pouch under water. The pouch was distended. There were no bubbles this time. The pouch was desufflated. The Terance Hart defect was closed with  running 2-0 silk. The abdomen was inspected for any evidence of bleeding or bowel injury and everything looked fine. The Nathanson retractor was removed under direct vision after coating the anastomosis with Evicel tissue sealant. Exparel was infiltrated in all trocar sites. All CO2 was evacuated and trochars removed. Skin incisions were closed with 4-0 monocryl in a subcuticular fashion followed by Dermabond. Sponge needle and instrument counts were correct. The patient was taken to the PACU in good condition.    Leighton Ruff. Redmond Pulling, MD, FACS General, Bariatric, & Minimally Invasive Surgery [facility], [place]

## 2015-05-09 NOTE — Interval H&P Note (Signed)
History and Physical Interval Note:  05/09/2015 10:36 AM  Donna Kidd  has presented today for surgery, with the diagnosis of morbid obesity  The various methods of treatment have been discussed with the patient and family. After consideration of risks, benefits and other options for treatment, the patient has consented to  Procedure(s): LAPAROSCOPIC ROUX-EN-Y GASTRIC BYPASS WITH UPPER ENDOSCOPY (N/A) as a surgical intervention .  The patient's history has been reviewed, patient examined, no change in status, stable for surgery.  I have reviewed the patient's chart and labs.  Questions were answered to the patient's satisfaction.    Leighton Ruff. Redmond Pulling, MD, [place], Bariatric, & Minimally Invasive Surgery [facility], [place]   [facility]

## 2015-05-10 ENCOUNTER — Inpatient Hospital Stay (HOSPITAL_COMMUNITY): Payer: BLUE CROSS/BLUE SHIELD

## 2015-05-10 ENCOUNTER — Encounter (HOSPITAL_COMMUNITY): Payer: Self-pay | Admitting: General Surgery

## 2015-05-10 LAB — GLUCOSE, CAPILLARY
GLUCOSE-CAPILLARY: 207 mg/dL — AB (ref 65–99)
GLUCOSE-CAPILLARY: 218 mg/dL — AB (ref 65–99)
GLUCOSE-CAPILLARY: 220 mg/dL — AB (ref 65–99)
GLUCOSE-CAPILLARY: 331 mg/dL — AB (ref 65–99)
Glucose-Capillary: 209 mg/dL — ABNORMAL HIGH (ref 65–99)
Glucose-Capillary: 338 mg/dL — ABNORMAL HIGH (ref 65–99)
Glucose-Capillary: 315 mg/dL — ABNORMAL HIGH (ref 65–99)
Glucose-Capillary: 201 mg/dL — ABNORMAL HIGH (ref 65–99)
Glucose-Capillary: 212 mg/dL — ABNORMAL HIGH (ref 65–99)

## 2015-05-10 LAB — CBC WITH DIFFERENTIAL/PLATELET
BASOS ABS: 0 10*3/uL (ref 0.0–0.1)
Basophils Relative: 0 % (ref 0–1)
EOS ABS: 0 10*3/uL (ref 0.0–0.7)
EOS PCT: 0 % (ref 0–5)
HCT: 33.3 % — ABNORMAL LOW (ref 36.0–46.0)
HEMOGLOBIN: 10.9 g/dL — AB (ref 12.0–15.0)
LYMPHS PCT: 12 % (ref 12–46)
Lymphs Abs: 1.6 10*3/uL (ref 0.7–4.0)
MCH: 29.4 pg (ref 26.0–34.0)
MCHC: 32.7 g/dL (ref 30.0–36.0)
MCV: 89.8 fL (ref 78.0–100.0)
Monocytes Absolute: 1.1 10*3/uL — ABNORMAL HIGH (ref 0.1–1.0)
Monocytes Relative: 8 % (ref 3–12)
NEUTROS PCT: 80 % — AB (ref 43–77)
Neutro Abs: 10.8 10*3/uL — ABNORMAL HIGH (ref 1.7–7.7)
PLATELETS: 276 10*3/uL (ref 150–400)
RBC: 3.71 MIL/uL — AB (ref 3.87–5.11)
RDW: 13.8 % (ref 11.5–15.5)
WBC: 13.6 10*3/uL — AB (ref 4.0–10.5)

## 2015-05-10 LAB — COMPREHENSIVE METABOLIC PANEL
ALK PHOS: 93 U/L (ref 38–126)
ALT: 143 U/L — AB (ref 14–54)
ANION GAP: 12 (ref 5–15)
AST: 171 U/L — ABNORMAL HIGH (ref 15–41)
Albumin: 3.6 g/dL (ref 3.5–5.0)
BUN: 24 mg/dL — ABNORMAL HIGH (ref 6–20)
CALCIUM: 9.1 mg/dL (ref 8.9–10.3)
CO2: 21 mmol/L — ABNORMAL LOW (ref 22–32)
CREATININE: 0.92 mg/dL (ref 0.44–1.00)
Chloride: 103 mmol/L (ref 101–111)
Glucose, Bld: 211 mg/dL — ABNORMAL HIGH (ref 65–99)
Potassium: 5.2 mmol/L — ABNORMAL HIGH (ref 3.5–5.1)
SODIUM: 136 mmol/L (ref 135–145)
TOTAL PROTEIN: 6.6 g/dL (ref 6.5–8.1)
Total Bilirubin: 0.4 mg/dL (ref 0.3–1.2)

## 2015-05-10 LAB — HEMOGLOBIN AND HEMATOCRIT, BLOOD
HEMATOCRIT: 33.7 % — AB (ref 36.0–46.0)
HEMOGLOBIN: 10.8 g/dL — AB (ref 12.0–15.0)

## 2015-05-10 MED ORDER — SODIUM CHLORIDE 0.45 % IV SOLN
INTRAVENOUS | Status: DC
  Administered 2015-05-10 – 2015-05-11 (×4): via INTRAVENOUS
  Administered 2015-05-12: 125 mL/h via INTRAVENOUS

## 2015-05-10 MED ORDER — LISINOPRIL 20 MG PO TABS
20.0000 mg | ORAL_TABLET | Freq: Every day | ORAL | Status: DC
  Administered 2015-05-10: 20 mg via ORAL
  Filled 2015-05-10 (×3): qty 1

## 2015-05-10 MED ORDER — IOHEXOL 300 MG/ML  SOLN
50.0000 mL | Freq: Once | INTRAMUSCULAR | Status: DC | PRN
  Administered 2015-05-10: 50 mL via ORAL
  Filled 2015-05-10: qty 50

## 2015-05-10 MED ORDER — HYDRALAZINE HCL 20 MG/ML IJ SOLN
10.0000 mg | Freq: Three times a day (TID) | INTRAMUSCULAR | Status: DC | PRN
  Administered 2015-05-11: 10 mg via INTRAVENOUS
  Filled 2015-05-10: qty 1

## 2015-05-10 MED ORDER — ENALAPRILAT 1.25 MG/ML IV SOLN: 1.2500 mg | Freq: Three times a day (TID) | INTRAVENOUS | Status: DC | PRN

## 2015-05-10 MED ORDER — INSULIN GLARGINE 100 UNIT/ML ~~LOC~~ SOLN
10.0000 [IU] | Freq: Every day | SUBCUTANEOUS | Status: DC
  Administered 2015-05-10 – 2015-05-12 (×3): 10 [IU] via SUBCUTANEOUS
  Filled 2015-05-10 (×3): qty 0.1

## 2015-05-10 MED ORDER — CETYLPYRIDINIUM CHLORIDE 0.05 % MT LIQD
7.0000 mL | Freq: Two times a day (BID) | OROMUCOSAL | Status: DC
  Administered 2015-05-10: 7 mL via OROMUCOSAL

## 2015-05-10 NOTE — Care Management Note (Signed)
Case Management Note  Patient Details  Name: Donna Kidd MRN: [id number] Date of Birth: 22-May-1966  Subjective/Objective:              Laparoscopic Roux-en-Y gastric bypass       Action/Plan: Discharge planning  Expected Discharge Date:                  Expected Discharge Plan:  Home/Self Care  In-House Referral:  NA  Discharge planning Services  NA  Post Acute Care Choice:  NA Choice offered to:  NA  DME Arranged:  N/A DME Agency:  NA  HH Arranged:  NA HH Agency:  NA  Status of Service:  Completed, signed off  Medicare Important Message Given:    Date Medicare IM Given:    Medicare IM give by:    Date Additional Medicare IM Given:    Additional Medicare Important Message give by:     If discussed at [place] of Stay Meetings, dates discussed:    Additional Comments:  Guadalupe Maple, RN 05/10/2015, 11:30 AM

## 2015-05-10 NOTE — Progress Notes (Signed)
Patient alert and oriented, Post op day 1.  Provided support and encouragement.  Encouraged pulmonary toilet, ambulation and small sips of liquids when swallow study returns satisfactorily.  All questions answered.  Will continue to monitor.

## 2015-05-10 NOTE — Plan of Care (Signed)
Problem: Phase II Progression Outcomes Goal: Tolerating diet advance to Outcome: Progressing Pt began POD #1 diet. Tolerating well.

## 2015-05-10 NOTE — Plan of Care (Signed)
Problem: Food- and Nutrition-Related Knowledge Deficit (NB-1.1) Goal: Nutrition education Formal process to instruct or train a patient/client in a skill or to impart knowledge to help patients/clients voluntarily manage or modify food choices and eating behavior to maintain or improve health. Outcome: Completed/Met Date Met:  05/10/15 Nutrition Education Note  Received consult for diet education per DROP protocol.   Discussed 2 week post op diet with pt. Emphasized that liquids must be non carbonated, non caffeinated, and sugar free. Fluid goals discussed. Pt to follow up with outpatient bariatric RD for further diet progression after 2 weeks. Multivitamins and minerals also reviewed. Teach back method used, pt expressed understanding, expect good compliance.   Diet: First 2 Weeks  You will see the nutritionist about two (2) weeks after your surgery. The nutritionist will increase the types of foods you can eat if you are handling liquids well:  If you have severe vomiting or nausea and cannot handle clear liquids lasting longer than 1 day, call your surgeon  Protein Shake  Drink at least 2 ounces of shake 5-6 times per day  Each serving of protein shakes (usually 8 - 12 ounces) should have a minimum of:  15 grams of protein  And no more than 5 grams of carbohydrate  Goal for protein each day:  Men = 80 grams per day  Women = 60 grams per day  Protein powder may be added to fluids such as non-fat milk or Lactaid milk or Soy milk (limit to 35 grams added protein powder per serving)   Hydration  Slowly increase the amount of water and other clear liquids as tolerated (See Acceptable Fluids)  Slowly increase the amount of protein shake as tolerated  Sip fluids slowly and throughout the day  May use sugar substitutes in small amounts (no more than 6 - 8 packets per day; i.e. Splenda)   Fluid Goal  The first goal is to drink at least 8 ounces of protein shake/drink per day (or as directed  by the nutritionist); some examples of protein shakes are Syntrax Nectar, Adkins Advantage, EAS Edge HP, and Unjury. See handout from pre-op Bariatric Education Class:  Slowly increase the amount of protein shake you drink as tolerated  You may find it easier to slowly sip shakes throughout the day  It is important to get your proteins in first  Your fluid goal is to drink 64 - 100 ounces of fluid daily  It may take a few weeks to build up to this  32 oz (or more) should be clear liquids  And  32 oz (or more) should be full liquids (see below for examples)  Liquids should not contain sugar, caffeine, or carbonation   Clear Liquids:  Water or Sugar-free flavored water (i.e. Fruit H2O, Propel)  Decaffeinated coffee or tea (sugar-free)  Crystal Lite, Wyler's Lite, Minute Maid Lite  Sugar-free Jell-O  Bouillon or broth  Sugar-free Popsicle: *Less than 20 calories each; Limit 1 per day   Full Liquids:  Protein Shakes/Drinks + 2 choices per day of other full liquids  Full liquids must be:  No More Than 12 grams of Carbs per serving  No More Than 3 grams of Fat per serving  Strained low-fat cream soup  Non-Fat milk  Fat-free Lactaid Milk  Sugar-free yogurt (Dannon Lite & Fit, Greek yogurt)     [place]ensington Rios, MS, RD, LDN Pager: [phone] After Hours Pager: [phone]        

## 2015-05-10 NOTE — Progress Notes (Signed)
1 Day Post-Op  Pt rounded on twice today  Subjective: Had some nausea but improving. Slept ok. Pain ok. Ambulated   Objective: Vital signs in last 24 hours: Temp:  [98.5 F (36.9 C)-99.2 F (37.3 C)] 98.5 F (36.9 C) (09/13 1800) Pulse Rate:  [74-99] 79 (09/13 1800) Resp:  [18-20] 20 (09/13 1800) BP: (148-177)/(57-67) 170/62 mmHg (09/13 1800) SpO2:  [96 %-99 %] 99 % (09/13 1800) Last BM Date: 05/09/15  Intake/Output from previous day: 09/12 0701 - 09/13 0700 In: 2250 [I.V.:2250] Out: 450 [Urine:450] Intake/Output this shift:    Alert, nontoxic Symmetric chest rise, nonlabored Regular pulse Soft, obese, incisions c/d/i; nontender +SCDs  Lab Results:   Recent Labs  05/10/15 0550 05/10/15 1647  WBC 13.6*  --   HGB 10.9* 10.8*  HCT 33.3* 33.7*  PLT 276  --    BMET  Recent Labs  05/10/15 0550  NA 136  K 5.2*  CL 103  CO2 21*  GLUCOSE 211*  BUN 24*  CREATININE 0.92  CALCIUM 9.1   PT/INR No results for input(s): LABPROT, INR in the last 72 hours. ABG No results for input(s): PHART, HCO3 in the last 72 hours.  Invalid input(s): PCO2, PO2  Studies/Results: Dg Ugi W/water Sol Cm  05/10/2015   CLINICAL DATA:  Patient status post Roux-en-Y gastric bypass surgery. One day post op.  EXAM: WATER SOLUBLE UPPER GI SERIES  TECHNIQUE: Single-column upper GI series was performed using water soluble contrast.  CONTRAST:  36mL OMNIPAQUE IOHEXOL 300 MG/ML  SOLN  COMPARISON:  Upper GI 02/09/2015  FLUOROSCOPY TIME:  Radiation Exposure Index (as provided by the fluoroscopic device):  If the device does not provide the exposure index:  Fluoroscopy Time (in minutes and seconds):  53 seconds  Number of Acquired Images:  9  FINDINGS: 50 cc of Omnipaque or ingested orally. Contrast flowed through the distal esophagus into the gastric remnant without obstruction. Contrast readily flowed through the gastrojejunostomy without obstruction or leak.No retention within the esophagus.   IMPRESSION: No evidence obstruction or leak at the gastrojejunostomy following bariatric Roux-en-Y gastric bypass.   Electronically Signed   By: Suzy Bouchard M.D.   On: 05/10/2015 10:10    Anti-infectives: Anti-infectives    Start     Dose/Rate Route Frequency Ordered Stop   05/09/15 0854  cefoTEtan in Dextrose 5% (CEFOTAN) IVPB 2 g     2 g Intravenous On call to O.R. 05/09/15 0854 05/09/15 1125      Assessment/Plan: s/p Procedure(s): LAPAROSCOPIC ROUX-EN-Y GASTRIC BYPASS WITH UPPER ENDOSCOPY (N/A) Looks good.  No fever. No tachycardia, UGI ok, start POD 1 diet Hyperkalemia - change IVF. Repeat in am Uncontrolled diabetes mellitus - cont high dose SSI, add lantus VTE prophylaxis - scds, cont lovenox HTN - restart home med  Leighton Ruff. Redmond Pulling, MD, FACS General, Bariatric, & Minimally Invasive Surgery [facility], [place]   LOS: 1 day    Gayland Curry 05/10/2015

## 2015-05-11 LAB — COMPREHENSIVE METABOLIC PANEL
ALBUMIN: 3.5 g/dL (ref 3.5–5.0)
ALT: 219 U/L — AB (ref 14–54)
AST: 183 U/L — ABNORMAL HIGH (ref 15–41)
Alkaline Phosphatase: 94 U/L (ref 38–126)
Anion gap: 10 (ref 5–15)
BUN: 12 mg/dL (ref 6–20)
CHLORIDE: 102 mmol/L (ref 101–111)
CO2: 23 mmol/L (ref 22–32)
CREATININE: 0.65 mg/dL (ref 0.44–1.00)
Calcium: 8.7 mg/dL — ABNORMAL LOW (ref 8.9–10.3)
GFR calc non Af Amer: >60 mL/min (ref >60)
GLUCOSE: 235 mg/dL — AB (ref 65–99)
Potassium: 4.1 mmol/L (ref 3.5–5.1)
SODIUM: 135 mmol/L (ref 135–145)
Total Bilirubin: 0.6 mg/dL (ref 0.3–1.2)
Total Protein: 6.8 g/dL (ref 6.5–8.1)

## 2015-05-11 LAB — CBC WITH DIFFERENTIAL/PLATELET
BASOS ABS: 0 10*3/uL (ref 0.0–0.1)
BASOS PCT: 0 %
EOS ABS: 0.1 10*3/uL (ref 0.0–0.7)
EOS PCT: 0 %
HCT: 32.5 % — ABNORMAL LOW (ref 36.0–46.0)
HEMOGLOBIN: 10.6 g/dL — AB (ref 12.0–15.0)
LYMPHS ABS: 2.3 10*3/uL (ref 0.7–4.0)
Lymphocytes Relative: 20 %
MCH: 29.6 pg (ref 26.0–34.0)
MCHC: 32.6 g/dL (ref 30.0–36.0)
MCV: 90.8 fL (ref 78.0–100.0)
Monocytes Absolute: 0.9 10*3/uL (ref 0.1–1.0)
Monocytes Relative: 8 %
NEUTROS PCT: 72 %
Neutro Abs: 8.2 10*3/uL — ABNORMAL HIGH (ref 1.7–7.7)
PLATELETS: 248 10*3/uL (ref 150–400)
RBC: 3.58 MIL/uL — AB (ref 3.87–5.11)
RDW: 13.9 % (ref 11.5–15.5)
WBC: 11.5 10*3/uL — AB (ref 4.0–10.5)

## 2015-05-11 LAB — GLUCOSE, CAPILLARY
GLUCOSE-CAPILLARY: 177 mg/dL — AB (ref 65–99)
GLUCOSE-CAPILLARY: 218 mg/dL — AB (ref 65–99)
GLUCOSE-CAPILLARY: 221 mg/dL — AB (ref 65–99)
Glucose-Capillary: 187 mg/dL — ABNORMAL HIGH (ref 65–99)
Glucose-Capillary: 197 mg/dL — ABNORMAL HIGH (ref 65–99)
Glucose-Capillary: 216 mg/dL — ABNORMAL HIGH (ref 65–99)
Glucose-Capillary: 219 mg/dL — ABNORMAL HIGH (ref 65–99)

## 2015-05-11 MED ORDER — LISINOPRIL 20 MG PO TABS
20.0000 mg | ORAL_TABLET | Freq: Two times a day (BID) | ORAL | Status: DC
  Administered 2015-05-11 – 2015-05-12 (×3): 20 mg via ORAL
  Filled 2015-05-11 (×5): qty 1

## 2015-05-11 MED ORDER — LISINOPRIL 20 MG PO TABS
20.0000 mg | ORAL_TABLET | Freq: Two times a day (BID) | ORAL | Status: DC
  Filled 2015-05-11 (×2): qty 1

## 2015-05-11 NOTE — Progress Notes (Signed)
Inpatient Diabetes Program Recommendations  AACE/ADA: New Consensus Statement on Inpatient Glycemic Control (2015)  Target Ranges:  Prepandial:   less than 140 mg/dL      Peak postprandial:   less than 180 mg/dL (1-2 hours)      Critically ill patients:  140 - 180 mg/dL   Review of Glycemic Control  Diabetes history: DM2 Outpatient Diabetes medications: Novolog 40-60-120 tidwc Current orders for Inpatient glycemic control: Lantus 10 units QD, Novolog resistant Q4H  2nd day post-op gastric bypass surgery. Hx IDDM and sees endo is Dr. Loanne Drilling. Pt states her blood sugars are always > 200 mg/dL. Does not like shakes and asked if husband could bring shakes from home. HgbA1C of 8.9% indicates sub-par control at home. Has f/u appt with endo next week. Pt states she does have hypoglycemia awareness.  Stressed importance of checking blood sugars at least 4-6x/day and record. Take logbook to MD for any needed insulin adjustments.  Recommendations:  Discharge on Lantus 10 units QD. (Discontinue if blood sugars < 70 mg/dL) Novolog resistant correction - tidwc and hs F/U with Endo within 1 week of discharge.  Thank you. Lorenda Peck, RD, LDN, CDE Inpatient Diabetes Coordinator [phone]

## 2015-05-11 NOTE — Progress Notes (Signed)
2 Days Post-Op  Subjective: Had IV issues overnight. Some nausea. Taking water ok. Some soreness in abdomen. Ambulation is at baseline.   Had HTN last pm/this am. BS better but still high  Objective: Vital signs in last 24 hours: Temp:  [98.2 F (36.8 C)-99.2 F (37.3 C)] 98.9 F (37.2 C) (09/14 0623) Pulse Rate:  [74-89] 87 (09/14 0623) Resp:  [20] 20 (09/14 0623) BP: (164-183)/(60-79) 183/77 mmHg (09/14 0623) SpO2:  [95 %-99 %] 95 % (09/14 0623) Last BM Date: 05/09/15  Intake/Output from previous day: 09/13 0701 - 09/14 0700 In: 2831.3 [P.O.:150; I.V.:2681.3] Out: 4075 [Urine:4075] Intake/Output this shift:    Alert, nontoxic cta b/l Reg Obese, soft, nd, incisions c/d/i; essentially nontender +SCDs  Lab Results:   Recent Labs  05/10/15 0550 05/10/15 1647 05/11/15 0550  WBC 13.6*  --  11.5*  HGB 10.9* 10.8* 10.6*  HCT 33.3* 33.7* 32.5*  PLT 276  --  248   BMET  Recent Labs  05/10/15 0550 05/11/15 0550  NA 136 135  K 5.2* 4.1  CL 103 102  CO2 21* 23  GLUCOSE 211* 235*  BUN 24* 12  CREATININE 0.92 0.65  CALCIUM 9.1 8.7*   PT/INR No results for input(s): LABPROT, INR in the last 72 hours. ABG No results for input(s): PHART, HCO3 in the last 72 hours.  Invalid input(s): PCO2, PO2  Studies/Results: Dg Ugi W/water Sol Cm  05/10/2015   CLINICAL DATA:  Patient status post Roux-en-Y gastric bypass surgery. One day post op.  EXAM: WATER SOLUBLE UPPER GI SERIES  TECHNIQUE: Single-column upper GI series was performed using water soluble contrast.  CONTRAST:  33mL OMNIPAQUE IOHEXOL 300 MG/ML  SOLN  COMPARISON:  Upper GI 02/09/2015  FLUOROSCOPY TIME:  Radiation Exposure Index (as provided by the fluoroscopic device):  If the device does not provide the exposure index:  Fluoroscopy Time (in minutes and seconds):  53 seconds  Number of Acquired Images:  9  FINDINGS: 50 cc of Omnipaque or ingested orally. Contrast flowed through the distal esophagus into the gastric  remnant without obstruction. Contrast readily flowed through the gastrojejunostomy without obstruction or leak.No retention within the esophagus.  IMPRESSION: No evidence obstruction or leak at the gastrojejunostomy following bariatric Roux-en-Y gastric bypass.   Electronically Signed   By: Suzy Bouchard M.D.   On: 05/10/2015 10:10    Anti-infectives: Anti-infectives    Start     Dose/Rate Route Frequency Ordered Stop   05/09/15 0854  cefoTEtan in Dextrose 5% (CEFOTAN) IVPB 2 g     2 g Intravenous On call to O.R. 05/09/15 0854 05/09/15 1125      Assessment/Plan: s/p Procedure(s): LAPAROSCOPIC ROUX-EN-Y GASTRIC BYPASS WITH UPPER ENDOSCOPY (N/A)  No fever. No tachycardia. Adv to POD 2 diet Her BP medication is orginially rx for bid but pt only takes once a day. Given persistent HTN, will put back on bid Will re-evaluate her PO intake later today and make call about discharge Discussed dc instructions Discussed importance of monitoring and recording Blood sugars several times a day. Has f/u with Dr Loanne Drilling next week. Advised her she should call or email his office her blood sugars on Friday or Monday and get recommendations. Will send her home on her novolog but reduced dose and sliding scale  Leighton Ruff. Redmond Pulling, MD, FACS General, Bariatric, & Minimally Invasive Surgery [facility], [place]   LOS: 2 days    Gayland Curry 05/11/2015

## 2015-05-12 LAB — COMPREHENSIVE METABOLIC PANEL
ALK PHOS: 89 U/L (ref 38–126)
ALT: 143 U/L — ABNORMAL HIGH (ref 14–54)
ANION GAP: 8 (ref 5–15)
AST: 59 U/L — ABNORMAL HIGH (ref 15–41)
Albumin: 3.2 g/dL — ABNORMAL LOW (ref 3.5–5.0)
BILIRUBIN TOTAL: 0.5 mg/dL (ref 0.3–1.2)
BUN: 9 mg/dL (ref 6–20)
CALCIUM: 8.3 mg/dL — AB (ref 8.9–10.3)
CO2: 26 mmol/L (ref 22–32)
Chloride: 102 mmol/L (ref 101–111)
Creatinine, Ser: 0.67 mg/dL (ref 0.44–1.00)
GFR calc non Af Amer: >60 mL/min (ref >60)
GLUCOSE: 225 mg/dL — AB (ref 65–99)
Potassium: 3.6 mmol/L (ref 3.5–5.1)
Sodium: 136 mmol/L (ref 135–145)
TOTAL PROTEIN: 6.5 g/dL (ref 6.5–8.1)

## 2015-05-12 LAB — CBC
HEMATOCRIT: 32.4 % — AB (ref 36.0–46.0)
HEMOGLOBIN: 10.3 g/dL — AB (ref 12.0–15.0)
MCH: 28.2 pg (ref 26.0–34.0)
MCHC: 31.8 g/dL (ref 30.0–36.0)
MCV: 88.8 fL (ref 78.0–100.0)
Platelets: 231 10*3/uL (ref 150–400)
RBC: 3.65 MIL/uL — ABNORMAL LOW (ref 3.87–5.11)
RDW: 13.7 % (ref 11.5–15.5)
WBC: 10.1 10*3/uL (ref 4.0–10.5)

## 2015-05-12 LAB — GLUCOSE, CAPILLARY
GLUCOSE-CAPILLARY: 176 mg/dL — AB (ref 65–99)
GLUCOSE-CAPILLARY: 225 mg/dL — AB (ref 65–99)
Glucose-Capillary: 217 mg/dL — ABNORMAL HIGH (ref 65–99)

## 2015-05-12 MED ORDER — OXYCODONE HCL 5 MG/5ML PO SOLN: 5.0000 mg | ORAL | Status: DC | PRN

## 2015-05-12 MED ORDER — GABAPENTIN 300 MG PO CAPS: 300.0000 mg | ORAL_CAPSULE | Freq: Three times a day (TID) | ORAL | Status: DC | PRN

## 2015-05-12 MED ORDER — INSULIN GLARGINE 100 UNIT/ML ~~LOC~~ SOLN: 15.0000 [IU] | Freq: Every day | SUBCUTANEOUS | Status: DC

## 2015-05-12 MED ORDER — LISINOPRIL 20 MG PO TABS: 20.0000 mg | ORAL_TABLET | Freq: Two times a day (BID) | ORAL | Status: DC

## 2015-05-12 MED ORDER — INSULIN ASPART 100 UNIT/ML FLEXPEN: PEN_INJECTOR | SUBCUTANEOUS | Status: DC

## 2015-05-12 MED ORDER — ATORVASTATIN CALCIUM 40 MG PO TABS: 40.0000 mg | ORAL_TABLET | Freq: Every day | ORAL | Status: DC

## 2015-05-12 NOTE — Progress Notes (Signed)
Inpatient Diabetes Program Recommendations  AACE/ADA: New Consensus Statement on Inpatient Glycemic Control (2015)  Target Ranges:  Prepandial:   less than 140 mg/dL      Peak postprandial:   less than 180 mg/dL (1-2 hours)      Critically ill patients:  140 - 180 mg/dL   Review of Glycemic Control  Results for GOLDA, ZAVALZA (MRN [id number]) as of 05/12/2015 10:00  Ref. Range 05/11/2015 16:21 05/11/2015 19:55 05/11/2015 23:52 05/12/2015 03:56 05/12/2015 07:40  Glucose-Capillary Latest Ref Range: 65-99 mg/dL 216 (H) 221 (H) 177 (H) 217 (H) 176 (H)  Results for WANNA, GULLY (MRN [id number]) as of 05/12/2015 10:00  Ref. Range 05/11/2015 05:50 05/12/2015 04:20  Glucose Latest Ref Range: 65-99 mg/dL 235 (H) 225 (H)   FBS still > 200 mg/dL. Increase Lantus.  Inpatient Diabetes Program Recommendations:    Increase Lantus to 15 units QD.  Will likely go home today. Instructed pt to check blood sugars at least 4x/day until appt with endo.  Thank you. Lorenda Peck, RD, LDN, CDE Inpatient Diabetes Coordinator [phone]

## 2015-05-12 NOTE — Progress Notes (Signed)
Patient alert and oriented, pain is controlled. Patient is tolerating fluids, advanced to protein shake yesterday, patient is tolerating well today. Reviewed Gastric Bypass discharge instructions with patient and patient is able to articulate understanding. Provided information on BELT program, Support Group and [facility] outpatient pharmacy. All questions answered, will continue to monitor.

## 2015-05-12 NOTE — Discharge Instructions (Addendum)
Check and record blood sugars 4 times a day email thru [place] or call Dr Cordelia Pen office with diabetes questions For blood sugars: use Novolog 0 to 120           give 0 units Novolog 121 to 150       Give 3 units Novolog 151 to 200       Give 4 units 201 to 250       Give 7 units 251 to 300       Give  11 units 301 to 350       Give 15 units 351 to 400       Give 20 units >400                Call Doctor     GASTRIC BYPASS/SLEEVE  Home Care Instructions   These instructions are to help you care for yourself when you go home.  Call: If you have any problems.  Call [phone] and ask for the surgeon on call  If you need immediate assistance come to the ER at [facility]. Tell the ER staff you are a new post-op gastric bypass or gastric sleeve patient  Signs and symptoms to report:  Severe  vomiting or nausea o If you cannot handle clear liquids for longer than 1 day, call your surgeon  Abdominal pain which does not get better after taking your pain medication  Fever greater than 100.4  F and chills  Heart rate over 100 beats a minute  Trouble breathing  Chest pain  Redness,  swelling, drainage, or foul odor at incision (surgical) sites  If your incisions open or pull apart  Swelling or pain in calf (lower leg)  Diarrhea (Loose bowel movements that happen often), frequent watery, uncontrolled bowel movements  Constipation, (no bowel movements for 3 days) if this happens: o Take Milk of Magnesia, 2 tablespoons by mouth, 3 times a day for 2 days if needed o Stop taking Milk of Magnesia once you have had a bowel movement o Call your doctor if constipation continues Or o Take Miralax  (instead of Milk of Magnesia) following the label instructions o Stop taking Miralax once you have had a bowel movement o Call your doctor if constipation continues  Anything you think is abnormal for you   Normal side effects after surgery:  Unable to sleep at night or unable  to concentrate  Irritability  Being tearful (crying) or depressed  These are common complaints, possibly related to your anesthesia, stress of surgery, and change in lifestyle, that usually go away a few weeks after surgery. If these feelings continue, call your medical doctor.  Wound Care: You may have surgical glue, steri-strips, or staples over your incisions after surgery  Surgical glue: Looks like clear film over your incisions and will wear off a little at a time  Steri-strips: Adhesive strips of tape over your incisions. You may notice a yellowish color on skin under the steri-strips. This is used to make the steri-strips stick better. Do not pull the steri-strips off - let them fall off  Staples: Staples may be removed before you leave the hospital o If you go home with staples, call [facility] for an appointment with your surgeons nurse to have staples removed 10 days after surgery, [phone]  Showering: You may shower two (2) days after your surgery unless your surgeon tells you differently o Wash gently around incisions with warm soapy water, rinse well, and  gently pat dry o If you have a drain (tube from your incision), you may need someone to hold this while you shower o No tub baths until staples are removed and incisions are healed   Medications:  Medications should be liquid or crushed if larger than the size of a dime  Extended release pills (medication that releases a little bit at a time through the  day) should not be crushed  Depending on the size and number of medications you take, you may need to space (take a few throughout the day)/change the time you take your medications so that you do not over-fill your pouch (smaller stomach)  Make sure you follow-up with you primary care physician to make medication changes needed during rapid weight loss and life -style changes  If you have diabetes, follow up with your doctor that orders your diabetes  medication(s) within one week after surgery and check your blood sugar regularly   Do not drive while taking narcotics (pain medications)   Do not take acetaminophen (Tylenol) and Roxicet or Lortab Elixir at the same time since these pain medications contain acetaminophen   Diet:  First 2 Weeks You will see the nutritionist about two (2) weeks after your surgery. The nutritionist will increase the types of foods you can eat if you are handling liquids well:  If you have severe vomiting or nausea and cannot handle clear liquids lasting longer than 1 day call your surgeon Protein Shake  Drink at least 2 ounces of shake 5-6 times per day  Each serving of protein shakes (usually 8-12 ounces) should have a minimum of: o 15 grams of protein o And no more than 5 grams of carbohydrate  Goal for protein each day: o Men = 80 grams per day o Women = 60 grams per day     Protein powder may be added to fluids such as non-fat milk or Lactaid milk or Soy milk (limit to 35 grams added protein powder per serving)  Hydration  Slowly increase the amount of water and other clear liquids as tolerated (See Acceptable Fluids)  Slowly increase the amount of protein shake as tolerated  Sip fluids slowly and throughout the day  May use sugar substitutes in small amounts (no more than 6-8 packets per day; i.e. Splenda)  Fluid Goal  The first goal is to drink at least 8 ounces of protein shake/drink per day (or as directed by the nutritionist); some examples of protein shakes are [employer], [employer], EAS Edge HP, and Unjury. - See handout from pre-op Bariatric Education Class: o Slowly increase the amount of protein shake you drink as tolerated o You may find it easier to slowly sip shakes throughout the day o It is important to get your proteins in first  Your fluid goal is to drink 64-100 ounces of fluid daily o It may take a few weeks to build up to this   32 oz. (or more) should  be clear liquids And  32 oz. (or more) should be full liquids (see below for examples)  Liquids should not contain sugar, caffeine, or carbonation  Clear Liquids:  Water of Sugar-free flavored water (i.e. Fruit HO, Propel)  Decaffeinated coffee or tea (sugar-free)  Crystal lite, Wylers Lite, Minute Maid Lite  Sugar-free Jell-O  Bouillon or broth  Sugar-free Popsicle:    - Less than 20 calories each; Limit 1 per day  Full Liquids:  Protein Shakes/Drinks + 2 choices per day of other full liquids  Full liquids must be: o No More Than 12 grams of Carbs per serving o No More Than 3 grams of Fat per serving  Strained low-fat cream soup  Non-Fat milk  Fat-free Lactaid Milk  Sugar-free yogurt (Dannon Lite & Fit, Greek yogurt)    Vitamins and Minerals  Start 1 day after surgery unless otherwise directed by your surgeon  2 Chewable Multivitamin / Multimineral Supplement with iron (i.e. Centrum for Adults)  Vitamin B-12, 350-500 micrograms sub-lingual (place tablet under the tongue) each day  Chewable Calcium Citrate with Vitamin D-3 (Example: 3 Chewable Calcium  Plus 600 with Vitamin D-3) o Take 500 mg three (3) times a day for a total of 1500 mg each day o Do not take all 3 doses of calcium at one time as it may cause constipation, and you can only absorb 500 mg at a time o Do not mix multivitamins containing iron with calcium supplements;  take 2 hours apart o Do not substitute Tums (calcium carbonate) for your calcium  Menstruating women and those at risk for anemia ( a blood disease that causes weakness) may need extra iron o Talk to your doctor to see if you need more iron  If you need extra iron: Total daily Iron recommendation (including Vitamins) is 50 to 100 mg Iron/day  Do not stop taking or change any vitamins or minerals until you talk to your nutritionist or surgeon  Your nutritionist and/or surgeon must approve all vitamin and mineral  supplements   Activity and Exercise: It is important to continue walking at home. Limit your physical activity as instructed by your doctor. During this time, use these guidelines:  Do not lift anything greater than ten  (10) pounds for at least two (2) weeks  Do not go back to work or drive until Engineer, production says you can  You may have sex when you feel comfortable o It is VERY important for female patients to use a reliable birth control method; fertility often increase after surgery o Do not get pregnant for at least 18 months  Start exercising as soon as your doctor tells you that you can o Make sure your doctor approves any physical activity  Start with a simple walking program  Walk 5-15 minutes each day, 7 days per week  Slowly increase until you are walking 30-45 minutes per day  Consider joining our [place] program. [phone] or email [email or web address]   Special Instructions Things to remember:  Free counseling is available for you and your family through collaboration between [facility] and [place]. Please call [phone] and leave a message  Use your CPAP when sleeping if this applies to you  Consider buying a medical alert bracelet that says you had lap-band surgery     You will likely have your first fill (fluid added to your band) 6 - 8 weeks after surgery  [facility] has a free Bariatric Surgery Support Group that meets monthly, the 3rd Thursday, [place]. You can see classes online at [email or web address]  It is very important to keep all follow up appointments with your surgeon, nutritionist, primary care physician, and behavioral health practitioner o After the first year, please follow up with your bariatric surgeon and nutritionist at least once a year in order to maintain best weight loss results  [facility]:  [place]: [phone]               Bariatric Nurse Coordinator: [phone]  Gastric Bypass/Sleeve Home Care Instructions  Rev. 09/2012                                                         Reviewed and Endorsed                                                    by [facility] Patient Education Committee, Jan, 2014

## 2015-05-13 ENCOUNTER — Telehealth: Payer: Self-pay | Admitting: Endocrinology

## 2015-05-13 MED ORDER — "INSULIN SYRINGE-NEEDLE U-100 30G X 1/2"" 0.5 ML MISC": 1.0000 | Freq: Every day | Status: DC

## 2015-05-13 NOTE — Telephone Encounter (Signed)
i called pt today: Pt needs syringes for lantus. i have sent a prescription to your pharmacy, for syringes Please lantus 15 units qd, and prn novolog i'll see you next week.

## 2015-05-13 NOTE — Discharge Summary (Signed)
Physician Discharge Summary  Donna Kidd MGQ:[id number] DOB: [birth year] DOA: 05/09/2015  PCP: [name], MD  Admit date: 05/09/2015 Discharge date: 05/12/2015  Recommendations for Outpatient Follow-up:   Follow-up Information    Follow up with Gayland Curry, MD. Go on 05/25/2015.   Specialty:  General Surgery   Why:  For Post-Op Check at 9:30 AM   Contact information:   [address] [place] [address] [phone]      Discharge Diagnoses:  Principal Problem:   Morbid obesity with BMI of 45.0-49.9, adult Active Problems:   Essential hypertension, benign   Hyperlipidemia with target LDL less than 100   Type 2 diabetes, uncontrolled, with neuropathy   Charcot foot due to diabetes mellitus   Diabetic retinopathy   S/P gastric bypass   Surgical Procedure: Laparoscopic Roux-en-Y gastric bypass, upper endoscopy  Discharge Condition: Good Disposition: Home  Diet recommendation: Postoperative gastric bypass diet  Filed Weights   05/09/15 0902  Weight: 135.342 kg (298 lb 6 oz)     Hospital Course:  The patient was admitted for a planned laparoscopic Roux-en-Y gastric bypass. Please see operative note. Preoperatively the patient was given 5000 units of subcutaneous heparin for DVT prophylaxis. Postoperative prophylactic Lovenox dosing was started on the morning of postoperative day 1. The patient underwent an upper GI on postoperative day 1 which demonstrated no extravasation of contrast and emptying of the contrast into the Roux limb. The patient was started on ice chips and water which they tolerated. On postoperative day 2 The patient's diet was advanced to protein shakes which they also tolerated. However she developed a severe headache which affected her oral intake so I decided to keep her another night. She also had hypertension issues. We restarted her lisinopril. She had only been taking it once a day but it was originally rx as bid so we placed her on it  twice a day and her BP improved. ON pod 3, The patient was ambulating without difficulty. Their vital signs are stable without fever or tachycardia. Their hemoglobin had remained stable.  The patient had received discharge instructions and counseling. They were deemed stable for discharge.  We had discussed her insulin regimen and was seen in consultation by diabetes coordinator.  She was instructed to check sugars 4x a day and to follow up with Dr Loanne Drilling as scheduled next week and to contact him with questions. We sent her home on a sliding scale of novolog and lantus   Discharge Instructions  Discharge Instructions    Ambulate hourly while awake    Complete by:  As directed      Call MD for:  difficulty breathing, headache or visual disturbances    Complete by:  As directed      Call MD for:  persistant dizziness or light-headedness    Complete by:  As directed      Call MD for:  persistant nausea and vomiting    Complete by:  As directed      Call MD for:  redness, tenderness, or signs of infection (pain, swelling, redness, odor or green/yellow discharge around incision site)    Complete by:  As directed      Call MD for:  severe uncontrolled pain    Complete by:  As directed      Call MD for:  temperature >101 F    Complete by:  As directed      Diet bariatric full liquid    Complete by:  As directed      Discharge instructions    Complete by:  As directed   See bariatric surgery discharge instructions Check and record blood sugars 4 times a day For blood sugars: 0 to 120           give 0 units Novolog 121 to 150       Give 3 units Novolog 151 to 200       Give 4 units 201 to 250       Give 7 units 251 to 300       Give  11 units 301 to 350       Give 15 units 351 to 400       Give 20 units >400                Call Doctor     Incentive spirometry    Complete by:  As directed   Perform hourly while awake            Medication List    STOP taking these medications         ibuprofen 200 MG tablet  Commonly known as:  ADVIL,MOTRIN     insulin lispro 100 UNIT/ML KiwkPen  Commonly known as:  HUMALOG KWIKPEN     Insulin Pen Needle 32G X 6 MM Misc  Commonly known as:  NOVOFINE     METANX 3-90.314-2-35 MG Caps     oxyCODONE-acetaminophen 5-325 MG per tablet  Commonly known as:  ROXICET     Wheelchair Misc      TAKE these medications        acetaminophen 500 MG tablet  Commonly known as:  TYLENOL  Take 500 mg by mouth every 8 (eight) hours as needed for mild pain or headache.     atorvastatin 40 MG tablet  Commonly known as:  LIPITOR  Take 1 tablet (40 mg total) by mouth at bedtime.     BIOTIN PO  Take 2 capsules by mouth at bedtime. 2500 mcg.     gabapentin 300 MG capsule  Commonly known as:  NEURONTIN  Take 1 capsule (300 mg total) by mouth 3 (three) times daily as needed (for pain).     insulin aspart 100 UNIT/ML FlexPen  Commonly known as:  NOVOLOG FLEXPEN  Use sliding scale in discharge instructions     insulin glargine 100 UNIT/ML injection  Commonly known as:  LANTUS  Inject 0.15 mLs (15 Units total) into the skin daily.     lisinopril 20 MG tablet  Commonly known as:  PRINIVIL,ZESTRIL  Take 1 tablet (20 mg total) by mouth 2 (two) times daily.     oxyCODONE 5 MG/5ML solution  Commonly known as:  ROXICODONE  Take 5-10 mLs (5-10 mg total) by mouth every 4 (four) hours as needed for moderate pain or severe pain.           Follow-up Information    Follow up with Gayland Curry, MD. Go on 05/25/2015.   Specialty:  General Surgery   Why:  For Post-Op Check at 9:30 AM   Contact information:   [place] [address] [phone]        The results of significant diagnostics from this hospitalization (including imaging, microbiology, ancillary and laboratory) are listed below for reference.    Significant Diagnostic Studies: Dg Ugi W/water Sol Cm  05/10/2015   CLINICAL DATA:  Patient status post  Roux-en-Y gastric bypass surgery. One  day post op.  EXAM: WATER SOLUBLE UPPER GI SERIES  TECHNIQUE: Single-column upper GI series was performed using water soluble contrast.  CONTRAST:  48mL OMNIPAQUE IOHEXOL 300 MG/ML  SOLN  COMPARISON:  Upper GI 02/09/2015  FLUOROSCOPY TIME:  Radiation Exposure Index (as provided by the fluoroscopic device):  If the device does not provide the exposure index:  Fluoroscopy Time (in minutes and seconds):  53 seconds  Number of Acquired Images:  9  FINDINGS: 50 cc of Omnipaque or ingested orally. Contrast flowed through the distal esophagus into the gastric remnant without obstruction. Contrast readily flowed through the gastrojejunostomy without obstruction or leak.No retention within the esophagus.  IMPRESSION: No evidence obstruction or leak at the gastrojejunostomy following bariatric Roux-en-Y gastric bypass.   Electronically Signed   By: Suzy Bouchard M.D.   On: 05/10/2015 10:10    Labs: Basic Metabolic Panel:  Recent Labs Lab 05/10/15 0550 05/11/15 0550 05/12/15 0420  NA 136 135 136  K 5.2* 4.1 3.6  CL 103 102 102  CO2 21* 23 26  GLUCOSE 211* 235* 225*  BUN 24* 12 9  CREATININE 0.92 0.65 0.67  CALCIUM 9.1 8.7* 8.3*   Liver Function Tests:  Recent Labs Lab 05/10/15 0550 05/11/15 0550 05/12/15 0420  AST 171* 183* 59*  ALT 143* 219* 143*  ALKPHOS 93 94 89  BILITOT 0.4 0.6 0.5  PROT 6.6 6.8 6.5  ALBUMIN 3.6 3.5 3.2*    CBC:  Recent Labs Lab 05/09/15 1500 05/10/15 0550 05/10/15 1647 05/11/15 0550 05/12/15 0420  WBC  --  13.6*  --  11.5* 10.1  NEUTROABS  --  10.8*  --  8.2*  --   HGB 12.1 10.9* 10.8* 10.6* 10.3*  HCT 36.5 33.3* 33.7* 32.5* 32.4*  MCV  --  89.8  --  90.8 88.8  PLT  --  276  --  248 231    CBG:  Recent Labs Lab 05/11/15 1955 05/11/15 2352 05/12/15 0356 05/12/15 0740 05/12/15 1203  GLUCAP 221* 177* 217* 176* 225*    Principal Problem:   Morbid obesity with BMI of 45.0-49.9, adult Active Problems:    Essential hypertension, benign   Hyperlipidemia with target LDL less than 100   Type 2 diabetes, uncontrolled, with neuropathy   Charcot foot due to diabetes mellitus   Diabetic retinopathy   S/P gastric bypass   Time coordinating discharge: 25 minutes  Signed:  Gayland Curry, MD [facility], [place] [phone] 05/13/2015, 4:02 PM

## 2015-05-16 ENCOUNTER — Telehealth (HOSPITAL_COMMUNITY): Payer: Self-pay

## 2015-05-16 NOTE — Telephone Encounter (Signed)
Attempted dc phone call, no answer, left message to return call  Made discharge phone call to patient per DROP protocol. Asking the following questions.    1. Do you have someone to care for you now that you are home?   2. Are you having pain now that is not relieved by your pain medication?   3. Are you able to drink the recommended daily amount of fluids (48 ounces minimum/day) and protein (60-80 grams/day) as prescribed by the dietitian or nutritional counselor?   4. Are you taking the vitamins and minerals as prescribed?   5. Do you have the "on call" number to contact your surgeon if you have a problem or question?   6. Are your incisions free of redness, swelling or drainage? (If steri strips, address that these can fall off, shower as tolerated)  7. Have your bowels moved since your surgery?  If not, are you passing gas?   8. Are you up and walking 3-4 times per day?      1. Do you have an appointment made to see your surgeon in the next month?   2. Were you provided your discharge medications before your surgery or before you were discharged from the hospital and are you taking them without problem?   3. Were you provided phone numbers to the clinic/surgeon's office?   4. Did you watch the patient education video module in the (clinic, surgeon's office, etc.) before your surgery?  5. Do you have a discharge checklist that was provided to you in the hospital to reference with instructions on how to take care of yourself after surgery?   6. Did you see a dietitian or nutritional counselor while you were in the hospital?   7. Do you have an appointment to see a dietitian or nutritional counselor in the next month?

## 2015-05-17 ENCOUNTER — Ambulatory Visit (INDEPENDENT_AMBULATORY_CARE_PROVIDER_SITE_OTHER): Payer: BLUE CROSS/BLUE SHIELD | Admitting: Endocrinology

## 2015-05-17 ENCOUNTER — Encounter: Payer: Self-pay | Admitting: Endocrinology

## 2015-05-17 VITALS — BP 132/84 | HR 91 | Temp 98.2°F | Ht 68.0 in | Wt 293.0 lb

## 2015-05-17 DIAGNOSIS — E1165 Type 2 diabetes mellitus with hyperglycemia: Secondary | ICD-10-CM

## 2015-05-17 DIAGNOSIS — E1140 Type 2 diabetes mellitus with diabetic neuropathy, unspecified: Secondary | ICD-10-CM | POA: Diagnosis not present

## 2015-05-17 DIAGNOSIS — IMO0002 Reserved for concepts with insufficient information to code with codable children: Secondary | ICD-10-CM

## 2015-05-17 LAB — POCT GLYCOSYLATED HEMOGLOBIN (HGB A1C): Hemoglobin A1C: 9.5

## 2015-05-17 MED ORDER — INSULIN GLARGINE 100 UNIT/ML SOLOSTAR PEN: 25.0000 [IU] | PEN_INJECTOR | Freq: Every day | SUBCUTANEOUS | Status: DC

## 2015-05-17 NOTE — Patient Instructions (Addendum)
Please increase the lantus to 25 units daily.   Continue to take the novolog as needed. check your blood sugar twice a day.  vary the time of day when you check, between before the 3 meals, and at bedtime.  also check if you have symptoms of your blood sugar being too high or too low.  please keep a record of the readings and bring it to your next appointment here.  You can write it on any piece of paper.  please call us sooner if your blood sugar goes below 70, or if you have a lot of readings over 200. Things are changing, so you should expect your insulin needs to change, too.  Please call us if you need us--we are here.   Please come back for a follow-up appointment in 1 month.
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
## 2015-05-24 ENCOUNTER — Ambulatory Visit: Payer: BLUE CROSS/BLUE SHIELD

## 2015-05-31 ENCOUNTER — Encounter: Payer: BLUE CROSS/BLUE SHIELD | Attending: General Surgery

## 2015-05-31 VITALS — Ht 68.0 in | Wt 282.5 lb

## 2015-05-31 DIAGNOSIS — Z6841 Body Mass Index (BMI) 40.0 and over, adult: Secondary | ICD-10-CM | POA: Insufficient documentation

## 2015-05-31 DIAGNOSIS — Z713 Dietary counseling and surveillance: Secondary | ICD-10-CM | POA: Insufficient documentation

## 2015-06-01 NOTE — Progress Notes (Signed)
Bariatric Class:  Appt start time: 1530 end time:  1630.  2 Week Post-Operative Nutrition Class  Patient was seen on 05/31/2015  for Post-Operative Nutrition education at [facility].   Surgery date: 05/09/2015 Surgery type: RYGB Start weight at [facility]: 313 lbs on 03/05/15 Weight today: 282.5 lbs  Weight change:33 lbs  TANITA  BODY COMP RESULTS  04/04/15 05/31/15   BMI (kg/m^2) 48 43.0   Fat Mass (lbs) 138 138.5   Fat Free Mass (lbs) 177.5 144.0   Total Body Water (lbs) 130 105.5    The following the learning objectives were met by the patient during this course:  Identifies Phase 3A (Soft, High Proteins) Dietary Goals and will begin from 2 weeks post-operatively to 2 months post-operatively  Identifies appropriate sources of fluids and proteins   States protein recommendations and appropriate sources post-operatively  Identifies the need for appropriate texture modifications, mastication, and bite sizes when consuming solids  Identifies appropriate multivitamin and calcium sources post-operatively  Describes the need for physical activity post-operatively and will follow MD recommendations  States when to call healthcare provider regarding medication questions or post-operative complications  Handouts given during class include:  Phase 3A: Soft, High Protein Diet Handout  Follow-Up Plan: Patient will follow-up at [facility] in 6 weeks for 2 month post-op nutrition visit for diet advancement per MD.

## 2015-06-15 ENCOUNTER — Encounter: Payer: Self-pay | Admitting: Endocrinology

## 2015-06-15 ENCOUNTER — Ambulatory Visit (INDEPENDENT_AMBULATORY_CARE_PROVIDER_SITE_OTHER): Payer: BLUE CROSS/BLUE SHIELD | Admitting: Endocrinology

## 2015-06-15 VITALS — BP 132/88 | HR 79 | Temp 98.2°F | Ht 68.0 in | Wt 280.0 lb

## 2015-06-15 DIAGNOSIS — E1142 Type 2 diabetes mellitus with diabetic polyneuropathy: Secondary | ICD-10-CM | POA: Diagnosis not present

## 2015-06-15 DIAGNOSIS — Z794 Long term (current) use of insulin: Secondary | ICD-10-CM

## 2015-06-15 NOTE — Patient Instructions (Signed)
Please continue the same insulins. Please come back for a follow-up appointment in 6 weeks. check your blood sugar twice a day.  vary the time of day when you check, between before the 3 meals, and at bedtime.  also check if you have symptoms of your blood sugar being too high or too low.  please keep a record of the readings and bring it to your next appointment here.  You can write it on any piece of paper.  please call us sooner if your blood sugar goes below 70, or if you have a lot of readings over 200. Things are changing, so you should expect your insulin needs to change, too.  Please call us if you need us--we are here.
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
## 2015-06-22 ENCOUNTER — Encounter: Payer: Self-pay | Admitting: Family Medicine

## 2015-06-28 ENCOUNTER — Ambulatory Visit (INDEPENDENT_AMBULATORY_CARE_PROVIDER_SITE_OTHER): Payer: BLUE CROSS/BLUE SHIELD | Admitting: Family Medicine

## 2015-06-28 VITALS — BP 155/82 | HR 70 | Temp 98.5°F | Resp 16 | Ht 68.0 in | Wt 279.6 lb

## 2015-06-28 DIAGNOSIS — G8929 Other chronic pain: Secondary | ICD-10-CM

## 2015-06-28 DIAGNOSIS — E1142 Type 2 diabetes mellitus with diabetic polyneuropathy: Secondary | ICD-10-CM | POA: Diagnosis not present

## 2015-06-28 DIAGNOSIS — M25511 Pain in right shoulder: Secondary | ICD-10-CM | POA: Diagnosis not present

## 2015-06-28 DIAGNOSIS — Z6841 Body Mass Index (BMI) 40.0 and over, adult: Secondary | ICD-10-CM

## 2015-06-28 DIAGNOSIS — E11610 Type 2 diabetes mellitus with diabetic neuropathic arthropathy: Secondary | ICD-10-CM | POA: Diagnosis not present

## 2015-06-28 DIAGNOSIS — Z9884 Bariatric surgery status: Secondary | ICD-10-CM | POA: Diagnosis not present

## 2015-06-28 DIAGNOSIS — N921 Excessive and frequent menstruation with irregular cycle: Secondary | ICD-10-CM

## 2015-06-28 DIAGNOSIS — I10 Essential (primary) hypertension: Secondary | ICD-10-CM

## 2015-06-28 DIAGNOSIS — R740 Nonspecific elevation of levels of transaminase and lactic acid dehydrogenase [LDH]: Secondary | ICD-10-CM

## 2015-06-28 DIAGNOSIS — D509 Iron deficiency anemia, unspecified: Secondary | ICD-10-CM

## 2015-06-28 DIAGNOSIS — Z79899 Other long term (current) drug therapy: Secondary | ICD-10-CM

## 2015-06-28 DIAGNOSIS — R7401 Elevation of levels of liver transaminase levels: Secondary | ICD-10-CM

## 2015-06-28 LAB — CBC
HEMATOCRIT: 33.4 % — AB (ref 36.0–46.0)
Hemoglobin: 11.4 g/dL — ABNORMAL LOW (ref 12.0–15.0)
MCH: 29 pg (ref 26.0–34.0)
MCHC: 34.1 g/dL (ref 30.0–36.0)
MCV: 85 fL (ref 78.0–100.0)
MPV: 10 fL (ref 8.6–12.4)
Platelets: 336 10*3/uL (ref 150–400)
RBC: 3.93 MIL/uL (ref 3.87–5.11)
RDW: 15.1 % (ref 11.5–15.5)
WBC: 7.4 10*3/uL (ref 4.0–10.5)

## 2015-06-28 LAB — COMPREHENSIVE METABOLIC PANEL
ALBUMIN: 4 g/dL (ref 3.6–5.1)
ALK PHOS: 102 U/L (ref 33–115)
ALT: 21 U/L (ref 6–29)
AST: 15 U/L (ref 10–35)
BUN: 8 mg/dL (ref 7–25)
CALCIUM: 9.6 mg/dL (ref 8.6–10.2)
CO2: 26 mmol/L (ref 20–31)
Chloride: 101 mmol/L (ref 98–110)
Creat: 0.53 mg/dL (ref 0.50–1.10)
Glucose, Bld: 210 mg/dL — ABNORMAL HIGH (ref 65–99)
POTASSIUM: 3.7 mmol/L (ref 3.5–5.3)
Sodium: 139 mmol/L (ref 135–146)
TOTAL PROTEIN: 6.6 g/dL (ref 6.1–8.1)
Total Bilirubin: 0.7 mg/dL (ref 0.2–1.2)

## 2015-06-28 LAB — VITAMIN B12: VITAMIN B 12: 1639 pg/mL — AB (ref 211–911)

## 2015-06-28 LAB — FERRITIN: FERRITIN: 58 ng/mL (ref 10–291)

## 2015-06-28 MED ORDER — PANTOPRAZOLE SODIUM 40 MG PO TBEC: 40.0000 mg | DELAYED_RELEASE_TABLET | Freq: Every day | ORAL | Status: DC

## 2015-06-28 MED ORDER — OXYCODONE HCL 5 MG PO TABS: 5.0000 mg | ORAL_TABLET | Freq: Four times a day (QID) | ORAL | Status: DC | PRN

## 2015-06-28 MED ORDER — LISINOPRIL 20 MG PO TABS: 20.0000 mg | ORAL_TABLET | Freq: Two times a day (BID) | ORAL | Status: DC

## 2015-06-28 NOTE — Patient Instructions (Signed)
I want to make sure that your exercise regimen is not going to make your shoulder worse. Ask the surgeons if you can use topical anti-inflammatories like voltaren gel and aspercreme.  Use icy-hot with tylenol and then pain medicine for when you can't sleep at night or focus due to severity of pain. Agree with following up with your gynecologist to get your periods treated - not being able to absorb a lot of iron combined with that amount of iron loss is going to make you exhausted so starting treatment to eliminate, lighten periods would be great.
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
## 2015-06-29 LAB — VITAMIN D 25 HYDROXY (VIT D DEFICIENCY, FRACTURES): VIT D 25 HYDROXY: 22 ng/mL — AB (ref 30–100)

## 2015-06-29 LAB — MICROALBUMIN / CREATININE URINE RATIO
Creatinine, Urine: 191 mg/dL (ref 20–320)
MICROALB/CREAT RATIO: 351 ug/mg{creat} — AB (ref <30)
Microalb, Ur: 67 mg/dL

## 2015-07-04 ENCOUNTER — Telehealth: Payer: Self-pay | Admitting: Family Medicine

## 2015-07-04 ENCOUNTER — Ambulatory Visit: Payer: BLUE CROSS/BLUE SHIELD | Admitting: Dietician

## 2015-07-04 NOTE — Telephone Encounter (Signed)
Please call [facility] and see if we can have pt's last labs faxed over - I basically just want to know what her last K was - if they can just tell us that that'd be great.  Care Everywhere won't update until pt is in the office. Thanks.

## 2015-07-04 NOTE — Telephone Encounter (Signed)
Pt left a message - called back. She has been hosp in [place] from Fri 11/4-Mon 11/7 - just got home an hour ago. On Thursday she found a large blister - 1 in by 3 in on her charcot foot. On Friday it had turned black.  It was brand-new - not there Wed.  On Fri it popped and drained a lot of fluid.  She went to her podiatrist office with emergent work-in and they admitted her into [facility] - did blood culture and put on several IV antibiotics. She had xray and then a MRI - was told the fusion was good - was diagnosed with cellulitis. They discharged her on liquid Augmentin and tested neg for MRSA. During her hosp, the pharmacist was giving her only 1 lisinopril a day rather than bid and then started her on norvasc as her BP was 160/100 during her hosp.  They also told her to start a liquid K supp but then the rx wasn't at the pharmacy.  She was not eating well in the hospital. Did a lot of jello and protein shakes as she was trying to comply with her diet since she is just 2 mos s/p bariatric surgery. She ate some mashed sweet potatoes in the hosp but then her sugars increased to 220. She has an appt with the bariatric nutritionist on Wed but not sure if she is going to be able to make it as she is non-weightbearing again.  Pt instructed that ok to hold off on the K supp since she is not on any meds that lower her K and outpt it has been fine - checked at her visit 6d ago and was 3.7.  Likely dropped in the hosp if she was not eating and they were giving her NS IVF. She cannot eat fruit yet on her bariatric surg diet but may consider trial of a little banana on Wed or Thurs.   I instructed to to not start the norvasc, resume lisinopril 20 bid as her BP has been stable on this for a long time and we hope she will be coming off medication as she is just 2 mos from her bariatric surgery. Also, any additional lower ext edema could exacerbate healing from the cellulitis as she prepares for a second charcot foot  surgery hopefully before the end of the yr. Pt has a BP cuff at home and will monitor her BP and call if elev.

## 2015-07-05 NOTE — Telephone Encounter (Signed)
Faxed a request to [facility] asking for lab results. confirmation received. Will wait for labs to be sent.

## 2015-07-06 ENCOUNTER — Encounter: Payer: BLUE CROSS/BLUE SHIELD | Attending: General Surgery | Admitting: Dietician

## 2015-07-06 ENCOUNTER — Encounter: Payer: Self-pay | Admitting: Dietician

## 2015-07-06 VITALS — Ht 68.0 in | Wt 275.9 lb

## 2015-07-06 DIAGNOSIS — Z6841 Body Mass Index (BMI) 40.0 and over, adult: Secondary | ICD-10-CM | POA: Diagnosis not present

## 2015-07-06 DIAGNOSIS — Z713 Dietary counseling and surveillance: Secondary | ICD-10-CM | POA: Diagnosis not present

## 2015-07-06 NOTE — Patient Instructions (Addendum)
Goals:  Follow Phase 3B: High Protein + Non-Starchy Vegetables  Eat 3-6 small meals/snacks, every 3-5 hrs  Increase lean protein foods to meet 60g goal  Increase fluid intake to 64oz +  Avoid drinking 15 minutes before, during and 30 minutes after eating  Aim for >30 min of physical activity daily  Avoid Cheezits, pretzels, and other carbs  Limit carbs to 15 grams or less per meal (about 60 grams of carbs per day)   POTASSIUM:  A medium banana has 422 mg of potassium. Here are other options:  Vegetables  o             Spinach,  cup cooked has 290 mg potassium o             Zucchini,  cup cooked has 280 mg potassium o             Tomato,  cup fresh has 210 mg potassium o             Winter squash, cubed, 1/2 cup, cooked: 448 mg o             Broccoli, 1 cup, cooked: 457 mg  Legumes and nuts o             Soybeans,  cup cooked has 440 mg potassium o             Lentils,  cup cooked has 370 mg potassium o             Kidney beans,  cup cooked has 360 mg potassium o             Almonds, one third of a cup has 310 mg potassium o             White beans, canned, drained, half cup: 595 mg o             Pistachios, shelled, 1 ounce, dry roasted: 295 mg  Protein o             Yogurt, fat-free, 1 cup: 579 mg o             Halibut, 3 ounces, cooked: 490 mg o             Pork tenderloin, 3 ounces, cooked: 382 mg o             Milk, skim or soy, 8 ounces: 340-410 mg o             Salmon, farmed [place], 3 ounces, cooked: 326 mg o             Chicken breast, 3 ounces, cooked: 218 mg o             Tuna, light, canned, drained, 3 ounces: 201 mg

## 2015-07-06 NOTE — Progress Notes (Signed)
  Follow-up visit:  8 Weeks Post-Operative RYGB Surgery  Medical Nutrition Therapy:  Appt start time: 0945 end time:  1030.  Primary concerns today: Post-operative Bariatric Surgery Nutrition Management.  Donna Kidd returns today having lost a total of 37 lbs. She was unable to use the [place] body composition scale today due to recent foot surgery. Using [employer] app to track protein intake. Feels like she is meeting protein needs but not fluid. Donna Kidd has been taking Augmentin from foot infection and this is causing nausea, vomiting, and diarrhea. She does not think stomach upset is due to RYGB.  Surgery date: 05/09/2015 Surgery type: RYGB Start weight at [facility]: 313 lbs on 03/05/15 Weight today: 275.9 lbs Weight change: 6.6 lbs Total weight lost: 37.1 lbs  [place]  BODY COMP RESULTS  04/04/15 05/31/15 07/06/15   BMI (kg/m^2) 48 43.0 N/A   Fat Mass (lbs) 138 138.5    Fat Free Mass (lbs) 177.5 144.0    Total Body Water (lbs) 130 105.5     Preferred Learning Style:   No preference indicated   Learning Readiness:   Ready  24-hr recall: B (AM): Babybel cheese OR [place] yogurt OR scrambled eggs OR Premier or Atkins protein shake (7-30g) Snk (AM): pork rinds or Slim Jim or beef jerky  L (PM): 2-3oz baked chicken (14-21g)  Snk (PM): Goldfish pretzels  D (PM): fish or roast beef or crab meat (21g) Snk (PM):   Fluid intake: Life water, water with sugar free flavoring, plain water, vitamin water zero, powerade zero (32 oz)+ 11 oz protein shake 43 oz Estimated total protein intake: 60 grams a day per patient  Medications: augmentin added, insulin lowered Supplementation: taking  CBG monitoring: 3-4x a day  Average CBG per patient: 116-230 mg/dL  Last patient reported A1c: none since surgery  Using straws: yes, not gas pain noted Drinking while eating: no Hair loss: none, taking Biotin Carbonated beverages: none N/V/D/C: nausea, diarrhea, and vomiting (antibiotic) Dumping syndrome:  none  Recent physical activity:  Started at [employer] but then had foot surgery  Progress Towards Goal(s):  In progress.  Handouts given during visit include:  Phase 3B lean protein + non starchy vegetables  Snack list    Nutritional Diagnosis:  Donna Kidd Overweight/obesity related to past poor dietary habits and physical inactivity as evidenced by patient w/ recent RYGB surgery following dietary guidelines for continued weight loss.     Intervention:  Nutrition counseling provided.  Teaching Method Utilized:  Visual Auditory Hands on  Barriers to learning/adherence to lifestyle change: foot injury  Demonstrated degree of understanding via:  Teach Back   Monitoring/Evaluation:  Dietary intake, exercise, and body weight. Follow up in 6 weeks for 3.5 month post-op visit.

## 2015-07-07 NOTE — Telephone Encounter (Signed)
That is not true. It must have gone to the wrong place.  Donna Kidd was hospitalized at [facility] from Fri 11/4-Mon 11/7.  I just want someone to call and find her last potassium - perhaps trying Medical Records [id number].

## 2015-07-07 NOTE — Telephone Encounter (Signed)
Last potassium done on 07/04/15 and it was 3.3.  I found this in care everywhere.

## 2015-07-07 NOTE — Telephone Encounter (Signed)
[facility] faxed a response back to [place] on 07/05/15 stating Donnah is not a patient there. Will place in Dr. Raul Del box.

## 2015-07-08 ENCOUNTER — Encounter: Payer: Self-pay | Admitting: Family Medicine

## 2015-07-08 NOTE — Telephone Encounter (Signed)
[place]!!!!

## 2015-07-18 ENCOUNTER — Telehealth: Payer: Self-pay

## 2015-07-18 NOTE — Telephone Encounter (Signed)
Pt called requesting medical clearance for her foot surgery. The paper are in your box from [facility]. Pt is trying to get this done before the end of the year because she has met her deductilble. Please advise.

## 2015-07-18 NOTE — Telephone Encounter (Signed)
Couldn't find papers - please obtain

## 2015-07-18 NOTE — Telephone Encounter (Signed)
I put them in your box, they were accidentally put in Dr. Thompson Caul box.

## 2015-07-19 NOTE — Telephone Encounter (Signed)
Signed and returned to nurse box - if we could copy in her hx and vitals from her last exam. Or maybe just print out her last ov here and write see attached OV, thanks.

## 2015-07-22 NOTE — Telephone Encounter (Signed)
Faxed

## 2015-07-27 ENCOUNTER — Ambulatory Visit (INDEPENDENT_AMBULATORY_CARE_PROVIDER_SITE_OTHER): Payer: BLUE CROSS/BLUE SHIELD | Admitting: Endocrinology

## 2015-07-27 ENCOUNTER — Encounter: Payer: Self-pay | Admitting: Endocrinology

## 2015-07-27 VITALS — BP 140/87 | HR 75 | Temp 98.5°F | Ht 68.0 in | Wt 266.0 lb

## 2015-07-27 DIAGNOSIS — E1142 Type 2 diabetes mellitus with diabetic polyneuropathy: Secondary | ICD-10-CM | POA: Diagnosis not present

## 2015-07-27 DIAGNOSIS — Z794 Long term (current) use of insulin: Secondary | ICD-10-CM

## 2015-07-27 LAB — POCT GLYCOSYLATED HEMOGLOBIN (HGB A1C): HEMOGLOBIN A1C: 7.7

## 2015-07-27 NOTE — Patient Instructions (Signed)
Please come back for a follow-up appointment in 2 months.  check your blood sugar twice a day.  vary the time of day when you check, between before the 3 meals, and at bedtime.  also check if you have symptoms of your blood sugar being too high or too low.  please keep a record of the readings and bring it to your next appointment here.  You can write it on any piece of paper.  please call us sooner if your blood sugar goes below 70, or if you have a lot of readings over 200.  Your body is changing, so you should expect your insulin needs to change, too.  Please call us if you need us--we are here.
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
## 2015-08-17 ENCOUNTER — Encounter: Payer: BLUE CROSS/BLUE SHIELD | Attending: General Surgery | Admitting: Dietician

## 2015-08-17 ENCOUNTER — Encounter: Payer: Self-pay | Admitting: Dietician

## 2015-08-17 VITALS — Ht 68.0 in | Wt 252.8 lb

## 2015-08-17 DIAGNOSIS — Z713 Dietary counseling and surveillance: Secondary | ICD-10-CM | POA: Insufficient documentation

## 2015-08-17 DIAGNOSIS — Z6841 Body Mass Index (BMI) 40.0 and over, adult: Secondary | ICD-10-CM | POA: Insufficient documentation

## 2015-08-17 NOTE — Patient Instructions (Addendum)
Goals:  Follow Phase 3B: High Protein + Non-Starchy Vegetables  Eat 3-6 small meals/snacks, every 3-5 hrs  Increase lean protein foods to meet 60g goal  Increase fluid intake to 64oz +  Avoid drinking 15 minutes before, during and 30 minutes after eating  Aim for >30 min of physical activity daily  Avoid Cheezits, pretzels, and other carbs  Limit carbs to 15 grams or less per meal (about 60 grams of carbs per day)  Surgery date: 05/09/2015 Surgery type: RYGB Start weight at [facility]: 313 lbs on 03/05/15 (319 per patient) Weight today: 252.8 lbs Weight change: 23.1 lbs Total weight lost: 66.2 lbs   POTASSIUM:  A medium banana has 422 mg of potassium. Here are other options:  Vegetables  o             Spinach,  cup cooked has 290 mg potassium o             Zucchini,  cup cooked has 280 mg potassium o             Tomato,  cup fresh has 210 mg potassium o             Winter squash, cubed, 1/2 cup, cooked: 448 mg o             Broccoli, 1 cup, cooked: 457 mg  Legumes and nuts o             Soybeans,  cup cooked has 440 mg potassium o             Lentils,  cup cooked has 370 mg potassium o             Kidney beans,  cup cooked has 360 mg potassium o             Almonds, one third of a cup has 310 mg potassium o             White beans, canned, drained, half cup: 595 mg o             Pistachios, shelled, 1 ounce, dry roasted: 295 mg  Protein o             Yogurt, fat-free, 1 cup: 579 mg o             Halibut, 3 ounces, cooked: 490 mg o             Pork tenderloin, 3 ounces, cooked: 382 mg o             Milk, skim or soy, 8 ounces: 340-410 mg o             Salmon, farmed [place], 3 ounces, cooked: 326 mg o             Chicken breast, 3 ounces, cooked: 218 mg o             Tuna, light, canned, drained, 3 ounces: 201 mg

## 2015-08-17 NOTE — Progress Notes (Signed)
  Follow-up visit: 3.5 months Post-Operative RYGB Surgery  Medical Nutrition Therapy:  Appt start time: 0945 end time: 1030  Primary concerns today: Post-operative Bariatric Surgery Nutrition Management.  Donna Kidd returns today having lost another 23 lbs. She has had another foot surgery on December 12 and has not been able to exercise. Proud of her weight loss; 20 lbs lost prior to bariatric surgery. Drinking less protein shakes and eating more protein foods.   Surgery date: 05/09/2015 Surgery type: RYGB Start weight at [facility]: 313 lbs on 03/05/15 (319 per patient) Weight today: 252.8 lbs Weight change: 23.1 lbs Total weight lost: 66.2 lbs  TANITA  BODY COMP RESULTS  04/04/15 05/31/15 07/06/15   BMI (kg/m^2) 48 43.0 N/A   Fat Mass (lbs) 138 138.5    Fat Free Mass (lbs) 177.5 144.0    Total Body Water (lbs) 130 105.5     Preferred Learning Style:   No preference indicated   Learning Readiness:   Ready  24-hr recall: B (AM): grilled chicken OR scrambled eggs (14g) Snk (AM):  Sometimes L[place] cheese L (PM): grilled chicken and green beans (14g) Snk (PM): beef jerky or nuts (7-14g) D (PM): grilled ribs (21g) Snk (PM):   Fluid intake: Life water, water with sugar free flavoring, plain water, vitamin water zero, powerade zero + 0-11 oz protein shake, decaf coffee ("close to 64 oz") Estimated total protein intake: 60 grams a day per patient  Medications: see list Supplementation: taking  CBG monitoring: 3-4x a day  Average CBG per patient: 116-230 mg/dL  Last patient reported A1c: 7.7%  Using straws: yes, not gas pain noted Drinking while eating: no Hair loss: yes, taking Biotin Carbonated beverages: none N/V/D/C: nausea with greasy foods, rarely vomits Dumping syndrome: none  Recent physical activity:  Started at [employer] but then had foot surgery  Progress Towards Goal(s):  In progress.   Nutritional Diagnosis:  [place]-3.3 Overweight/obesity related to past poor  dietary habits and physical inactivity as evidenced by patient w/ recent RYGB surgery following dietary guidelines for continued weight loss.     Intervention:  Nutrition counseling provided.  Teaching Method Utilized:  Visual Auditory Hands on  Barriers to learning/adherence to lifestyle change: foot injury  Demonstrated degree of understanding via:  Teach Back   Monitoring/Evaluation:  Dietary intake, exercise, and body weight. Follow up in 2 months for 5.5 month post-op visit.

## 2015-09-26 ENCOUNTER — Ambulatory Visit: Payer: BLUE CROSS/BLUE SHIELD | Admitting: Endocrinology

## 2015-10-02 ENCOUNTER — Ambulatory Visit (INDEPENDENT_AMBULATORY_CARE_PROVIDER_SITE_OTHER): Payer: BLUE CROSS/BLUE SHIELD | Admitting: Family Medicine

## 2015-10-02 VITALS — BP 120/78 | HR 75 | Temp 98.3°F | Resp 14 | Ht 68.0 in | Wt 252.0 lb

## 2015-10-02 DIAGNOSIS — I10 Essential (primary) hypertension: Secondary | ICD-10-CM

## 2015-10-02 DIAGNOSIS — Z9884 Bariatric surgery status: Secondary | ICD-10-CM | POA: Diagnosis not present

## 2015-10-02 DIAGNOSIS — E1142 Type 2 diabetes mellitus with diabetic polyneuropathy: Secondary | ICD-10-CM | POA: Diagnosis not present

## 2015-10-02 DIAGNOSIS — E11610 Type 2 diabetes mellitus with diabetic neuropathic arthropathy: Secondary | ICD-10-CM | POA: Diagnosis not present

## 2015-10-02 MED ORDER — LISINOPRIL 20 MG PO TABS: 20.0000 mg | ORAL_TABLET | Freq: Every day | ORAL | Status: DC

## 2015-10-02 NOTE — Patient Instructions (Addendum)
Decrease your lisinopril to just one tab a day. Check your blood pressure 2-4 times a week at varying times of day. Managing Your High Blood Pressure Blood pressure is a measurement of how forceful your blood is pressing against the walls of the arteries. Arteries are muscular tubes within the circulatory system. Blood pressure does not stay the same. Blood pressure rises when you are active, excited, or nervous; and it lowers during sleep and relaxation. If the numbers measuring your blood pressure stay above normal most of the time, you are at risk for health problems. High blood pressure (hypertension) is a long-term (chronic) condition in which blood pressure is elevated. A blood pressure reading is recorded as two numbers, such as 120 over 80 (or 120/80). The first, higher number is called the systolic pressure. It is a measure of the pressure in your arteries as the heart beats. The second, lower number is called the diastolic pressure. It is a measure of the pressure in your arteries as the heart relaxes between beats.  Keeping your blood pressure in a normal range is important to your overall health and prevention of health problems, such as heart disease and stroke. When your blood pressure is uncontrolled, your heart has to work harder than normal. High blood pressure is a very common condition in adults because blood pressure tends to rise with age. Men and women are equally likely to have hypertension but at different times in life. Before age 62, men are more likely to have hypertension. After 50 years of age, women are more likely to have it. Hypertension is especially common in African Americans. This condition often has no signs or symptoms. The cause of the condition is usually not known. Your caregiver can help you come up with a plan to keep your blood pressure in a normal, healthy range. BLOOD PRESSURE STAGES Blood pressure is classified into four stages: normal, prehypertension, stage 1,  and stage 2. Your blood pressure reading will be used to determine what type of treatment, if any, is necessary. Appropriate treatment options are tied to these four stages:  Normal  Systolic pressure (mm Hg): below 120.  Diastolic pressure (mm Hg): below 80. Prehypertension  Systolic pressure (mm Hg): 120 to 139.  Diastolic pressure (mm Hg): 80 to 89. Stage1  Systolic pressure (mm Hg): 140 to 159.  Diastolic pressure (mm Hg): 90 to 99. Stage2  Systolic pressure (mm Hg): 160 or above.  Diastolic pressure (mm Hg): 100 or above. RISKS RELATED TO HIGH BLOOD PRESSURE Managing your blood pressure is an important responsibility. Uncontrolled high blood pressure can lead to:  A heart attack.  A stroke.  A weakened blood vessel (aneurysm).  Heart failure.  Kidney damage.  Eye damage.  Metabolic syndrome.  Memory and concentration problems. HOW TO MANAGE YOUR BLOOD PRESSURE Blood pressure can be managed effectively with lifestyle changes and medicines (if needed). Your caregiver will help you come up with a plan to bring your blood pressure within a normal range. Your plan should include the following: Education  Read all information provided by your caregivers about how to control blood pressure.  Educate yourself on the latest guidelines and treatment recommendations. New research is always being done to further define the risks and treatments for high blood pressure. Lifestylechanges  Control your weight.  Avoid smoking.  Stay physically active.  Reduce the amount of salt in your diet.  Reduce stress.  Control any chronic conditions, such as high cholesterol or diabetes.  Reduce your alcohol intake. Medicines  Several medicines (antihypertensive medicines) are available, if needed, to bring blood pressure within a normal range. Communication  Review all the medicines you take with your caregiver because there may be side effects or interactions.  Talk  with your caregiver about your diet, exercise habits, and other lifestyle factors that may be contributing to high blood pressure.  See your caregiver regularly. Your caregiver can help you create and adjust your plan for managing high blood pressure. RECOMMENDATIONS FOR TREATMENT AND FOLLOW-UP  The following recommendations are based on current guidelines for managing high blood pressure in nonpregnant adults. Use these recommendations to identify the proper follow-up period or treatment option based on your blood pressure reading. You can discuss these options with your caregiver.  Systolic pressure of 123456 to [id number] or diastolic pressure of 80 to 89: Follow up with your caregiver as directed.  Systolic pressure of XX123456 to 0000000 or diastolic pressure of 90 to 100: Follow up with your caregiver within 2 months.  Systolic pressure above 0000000 or diastolic pressure above [id number]: Follow up with your caregiver within 1 month.  Systolic pressure above [id number] or diastolic pressure above [id number]: Consider antihypertensive therapy; follow up with your caregiver within 1 week.  Systolic pressure above [id number] or diastolic pressure above 123456: Begin antihypertensive therapy; follow up with your caregiver within 1 week.   This information is not intended to replace advice given to you by your health care provider. Make sure you discuss any questions you have with your health care provider.   Document Released: 05/07/2012 Document Reviewed: 05/07/2012 Elsevier Interactive Patient Education [employer].
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
## 2015-10-12 ENCOUNTER — Encounter: Payer: Self-pay | Admitting: Dietician

## 2015-10-12 ENCOUNTER — Encounter: Payer: BLUE CROSS/BLUE SHIELD | Attending: General Surgery | Admitting: Dietician

## 2015-10-12 VITALS — Ht 68.0 in | Wt 249.5 lb

## 2015-10-12 DIAGNOSIS — Z6841 Body Mass Index (BMI) 40.0 and over, adult: Secondary | ICD-10-CM | POA: Insufficient documentation

## 2015-10-12 DIAGNOSIS — E1169 Type 2 diabetes mellitus with other specified complication: Secondary | ICD-10-CM

## 2015-10-12 DIAGNOSIS — Z794 Long term (current) use of insulin: Secondary | ICD-10-CM

## 2015-10-12 DIAGNOSIS — Z713 Dietary counseling and surveillance: Secondary | ICD-10-CM | POA: Insufficient documentation

## 2015-10-12 NOTE — Progress Notes (Signed)
  Follow-up visit: 5 months Post-Operative RYGB Surgery  Medical Nutrition Therapy:  Appt start time: 1030 end time: 1115  Primary concerns today: Post-operative Bariatric Surgery Nutrition Management.  Donna Kidd returns today having lost another 3 lbs. She is still unable to walk much and has to use a wheelchair some. Her foot is not healing properly from her surgery. She reports her HgbA1c jumped up to 9%. Able to tolerate about 3 oz of meat at a time. Tries to stay at 30 grams carbs per day. Very frustrated and tearful today.   Non scale victories: flexibility, smaller calves and thighs, moving in the bed, bending down  Surgery date: 05/09/2015 Surgery type: RYGB Start weight at [facility]: 313 lbs on 03/05/15 (319 per patient) Weight today: 249.5 lbs Weight change: 3 lbs Total weight lost: 69 lbs  TANITA  BODY COMP RESULTS  04/04/15 05/31/15 07/06/15 10/12/15   BMI (kg/m^2) 48 43.0 N/A 37.9   Fat Mass (lbs) 138 138.5  118.5   Fat Free Mass (lbs) 177.5 144.0  131   Total Body Water (lbs) 130 105.5  96    Preferred Learning Style:   No preference indicated   Learning Readiness:   Ready  24-hr recall: B (AM): 2 [employer] fritattas  Snk (AM):  "Energy blend" pumpkin seeds, almonds, edamame, and dried fruit L (PM): grilled chicken and green beans (14g) Snk (PM): beef jerky (7-14g) D (PM): grilled ribs (21g) Snk (PM):   Fluid intake: Life water, water with sugar free flavoring, plain water, vitamin water zero, powerade zero + 0-11 oz protein shake, decaf coffee ("close to 64 oz") Estimated total protein intake: 60 grams a day per patient  Medications: see list Supplementation: taking  CBG monitoring: 3-4x a day  Average CBG per patient: 116-230 mg/dL  Last patient reported A1c: 7.7%  Using straws: yes, not gas pain noted Drinking while eating: no Hair loss: yes, taking Biotin Carbonated beverages: none N/V/D/C: nausea with greasy foods, rarely vomits Dumping syndrome:  none  Recent physical activity:  Started at [employer] but then had foot surgery  Progress Towards Goal(s):  In progress.   Nutritional Diagnosis:  [place]-3.3 Overweight/obesity related to past poor dietary habits and physical inactivity as evidenced by patient w/ recent RYGB surgery following dietary guidelines for continued weight loss.     Intervention:  Nutrition counseling provided.  Handout provided: Armchair exercises  Teaching Method Utilized:  Visual Auditory Hands on  Barriers to learning/adherence to lifestyle change: foot injury  Demonstrated degree of understanding via:  Teach Back   Monitoring/Evaluation:  Dietary intake, exercise, and body weight. Follow up in 3 months for 8 month post-op visit.

## 2015-10-12 NOTE — Patient Instructions (Addendum)
Goals:  Follow Phase 3B: High Protein + Non-Starchy Vegetables  Eat 3-6 small meals/snacks, every 3-5 hrs  Increase lean protein foods to meet 60g goal  Increase fluid intake to 64oz +  Avoid drinking 15 minutes before, during and 30 minutes after eating  Aim for >30 min of physical activity daily  Avoid Cheezits, pretzels, and other carbs  Limit carbs to 15 grams or less per meal (about 60 grams of carbs per day)  Keep limiting energy blend to 1/4 cup or less  Surgery date: 05/09/2015 Surgery type: RYGB Start weight at [facility]: 313 lbs on 03/05/15 (319 per patient) Weight today: 249.5 lbs Weight change: 3 lbs Total weight lost: 69 lbs  TANITA  BODY COMP RESULTS  04/04/15 05/31/15 07/06/15 10/12/15   BMI (kg/m^2) 48 43.0 N/A 37.9   Fat Mass (lbs) 138 138.5  118.5   Fat Free Mass (lbs) 177.5 144.0  131   Total Body Water (lbs) 130 105.5  96     POTASSIUM:  A medium banana has 422 mg of potassium. Here are other options:  Vegetables  o             Spinach,  cup cooked has 290 mg potassium o             Zucchini,  cup cooked has 280 mg potassium o             Tomato,  cup fresh has 210 mg potassium o             Winter squash, cubed, 1/2 cup, cooked: 448 mg o             Broccoli, 1 cup, cooked: 457 mg  Legumes and nuts o             Soybeans,  cup cooked has 440 mg potassium o             Lentils,  cup cooked has 370 mg potassium o             Kidney beans,  cup cooked has 360 mg potassium o             Almonds, one third of a cup has 310 mg potassium o             White beans, canned, drained, half cup: 595 mg o             Pistachios, shelled, 1 ounce, dry roasted: 295 mg  Protein o             Yogurt, fat-free, 1 cup: 579 mg o             Halibut, 3 ounces, cooked: 490 mg o             Pork tenderloin, 3 ounces, cooked: 382 mg o             Milk, skim or soy, 8 ounces: 340-410 mg o             Salmon, farmed Atlantic, 3 ounces, cooked: 326 mg o              Chicken breast, 3 ounces, cooked: 218 mg o             Tuna, light, canned, drained, 3 ounces: 201 mg

## 2015-11-15 ENCOUNTER — Encounter: Payer: Self-pay | Admitting: Endocrinology

## 2015-11-15 ENCOUNTER — Ambulatory Visit (INDEPENDENT_AMBULATORY_CARE_PROVIDER_SITE_OTHER): Payer: BLUE CROSS/BLUE SHIELD | Admitting: Endocrinology

## 2015-11-15 VITALS — BP 124/86 | HR 70 | Temp 98.3°F | Ht 68.0 in | Wt 245.0 lb

## 2015-11-15 DIAGNOSIS — E1142 Type 2 diabetes mellitus with diabetic polyneuropathy: Secondary | ICD-10-CM | POA: Diagnosis not present

## 2015-11-15 DIAGNOSIS — Z794 Long term (current) use of insulin: Secondary | ICD-10-CM

## 2015-11-15 MED ORDER — METFORMIN HCL 500 MG PO TABS: 1000.0000 mg | ORAL_TABLET | Freq: Two times a day (BID) | ORAL | Status: DC

## 2015-11-15 NOTE — Patient Instructions (Addendum)
i have sent a prescription to your pharmacy, to resume the metformin. Please stop taking the lantus, and: Please continue the same as-needed novolog. check your blood sugar twice a day.  vary the time of day when you check, between before the 3 meals, and at bedtime.  also check if you have symptoms of your blood sugar being too high or too low.  please keep a record of the readings and bring it to your next appointment here (or you can bring the meter itself).  You can write it on any piece of paper.  please call us sooner if your blood sugar goes below 70, or if you have a lot of readings over 200. Please continue the same lisinopril. Please come back for a follow-up appointment in 2 months.
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
## 2015-11-22 ENCOUNTER — Encounter: Payer: Self-pay | Admitting: Endocrinology

## 2015-12-06 DIAGNOSIS — L97511 Non-pressure chronic ulcer of other part of right foot limited to breakdown of skin: Secondary | ICD-10-CM | POA: Diagnosis not present

## 2015-12-06 DIAGNOSIS — E11610 Type 2 diabetes mellitus with diabetic neuropathic arthropathy: Secondary | ICD-10-CM | POA: Diagnosis not present

## 2015-12-06 DIAGNOSIS — E11621 Type 2 diabetes mellitus with foot ulcer: Secondary | ICD-10-CM | POA: Diagnosis not present

## 2016-01-04 ENCOUNTER — Encounter: Payer: BLUE CROSS/BLUE SHIELD | Attending: General Surgery | Admitting: Dietician

## 2016-01-04 ENCOUNTER — Encounter: Payer: Self-pay | Admitting: Dietician

## 2016-01-04 VITALS — Ht 68.0 in | Wt 237.4 lb

## 2016-01-04 DIAGNOSIS — Z713 Dietary counseling and surveillance: Secondary | ICD-10-CM | POA: Insufficient documentation

## 2016-01-04 DIAGNOSIS — Z794 Long term (current) use of insulin: Secondary | ICD-10-CM

## 2016-01-04 DIAGNOSIS — E118 Type 2 diabetes mellitus with unspecified complications: Secondary | ICD-10-CM

## 2016-01-04 DIAGNOSIS — Z6841 Body Mass Index (BMI) 40.0 and over, adult: Secondary | ICD-10-CM | POA: Insufficient documentation

## 2016-01-04 NOTE — Progress Notes (Signed)
  Follow-up visit: 8 months Post-Operative RYGB Surgery  Medical Nutrition Therapy:  Appt start time: 1025 end time: 1100  Primary concerns today: Post-operative Bariatric Surgery Nutrition Management.  Donna Kidd returns today having lost another 12 lbs. Able to walk again but still limited with exercise. Riding stationary bike when she can. Reduced insulin but having low blood sugars in the night. Has an appointment with endocrinologist next week. Staying between 50-60 grams of carbs per day. Also having some low blood pressure (dizziness when she bends over).   Non scale victories: flexibility, smaller calves and thighs, moving in the bed, bending down, able to wear pants that used to be too small, reduced insulin and blood pressure meds  Surgery date: 05/09/2015 Surgery type: RYGB Start weight at [facility]: 313 lbs on 03/05/15 (319 per patient) Weight today: 237.4 lbs Weight change: 12 lbs Total weight lost: 81.6 lbs  TANITA  BODY COMP RESULTS  04/04/15 05/31/15 07/06/15 10/12/15 01/04/16   BMI (kg/m^2) 48 43.0 N/A 37.9 N/A   Fat Mass (lbs) 138 138.5  118.5    Fat Free Mass (lbs) 177.5 144.0  131    Total Body Water (lbs) 130 105.5  96     Preferred Learning Style:   No preference indicated   Learning Readiness:   Ready  24-hr recall: B (AM): 2 Jimmy Dean fritattas OR Atkins breakfast bowl (meat and veggies) Snk (AM): usually none, sometimes beef jerky or protein shake or yogurt L (12PM): broiled shrimp or fish and green beans  Snk (PM): see morning snack D (6-8 PM): Atkins frozen meals OR grilled chicken sometimes with with green beans or raw broccoli and carrots  Snk (PM):   Goes to bed around 8:30-9 pm and having lows in the night. May eat some cheese in the night if she wakes up hungry. Drinks a juice box when blood sugar is low.  Fluid intake: Life water, water with sugar free flavoring, plain water, vitamin water zero, powerade zero + 0-11 oz protein shake, decaf coffee ("close to 64  oz") Estimated total protein intake: 60 grams a day per patient  Medications: see list; reduced insulin  Supplementation: taking  CBG monitoring: 3-4x a day  Average CBG per patient: fasting about 155 mg/dL in the am (having lows at night 47-67 mg/dL)  Last patient reported A1c: 7.7%  Using straws: yes, not gas pain noted Drinking while eating: no Hair loss: yes, taking Biotin Carbonated beverages: none N/V/D/C: nausea with greasy foods, rarely vomits Dumping syndrome: none  Recent physical activity:  Stationary bike when she can  Progress Towards Goal(s):  In progress.   Nutritional Diagnosis:  [place]-3.3 Overweight/obesity related to past poor dietary habits and physical inactivity as evidenced by patient w/ recent RYGB surgery following dietary guidelines for continued weight loss.     Intervention:  Nutrition counseling provided.  Handout provided: none  Teaching Method Utilized:  Visual Auditory Hands on  Barriers to learning/adherence to lifestyle change: foot injury  Demonstrated degree of understanding via:  Teach Back   Monitoring/Evaluation:  Dietary intake, exercise, and body weight. Follow up in 3 months for 11 month post-op visit.

## 2016-01-04 NOTE — Patient Instructions (Addendum)
Goals:  Follow Phase 3B: High Protein + Non-Starchy Vegetables  Eat 3-6 small meals/snacks, every 3-5 hrs  Increase lean protein foods to meet 60g goal  Increase fluid intake to 64oz +  Avoid drinking 15 minutes before, during and 30 minutes after eating  Aim for >30 min of physical activity daily  Avoid Cheezits, pretzels, and other carbs  Limit carbs to 15 grams or less per meal (about 60 grams of carbs per day)  Try [facility] protein chips                                                                                                                                                                                     Non scale victories: flexibility, smaller calves and thighs, moving in the bed, bending down, able to wear pants that used to be too small, reduced insulin and blood pressure meds   Surgery date: 05/09/2015 Surgery type: RYGB Start weight at [facility]: 313 lbs on 03/05/15 (319 per patient) Weight today: 237.4 lbs Weight change: 12 lbs Total weight lost: 81.6 lbs

## 2016-01-05 DIAGNOSIS — Z9884 Bariatric surgery status: Secondary | ICD-10-CM | POA: Diagnosis not present

## 2016-01-05 DIAGNOSIS — Z794 Long term (current) use of insulin: Secondary | ICD-10-CM | POA: Diagnosis not present

## 2016-01-05 DIAGNOSIS — E11610 Type 2 diabetes mellitus with diabetic neuropathic arthropathy: Secondary | ICD-10-CM | POA: Diagnosis not present

## 2016-01-05 DIAGNOSIS — E119 Type 2 diabetes mellitus without complications: Secondary | ICD-10-CM | POA: Diagnosis not present

## 2016-01-06 DIAGNOSIS — E11621 Type 2 diabetes mellitus with foot ulcer: Secondary | ICD-10-CM | POA: Diagnosis not present

## 2016-01-06 DIAGNOSIS — E11610 Type 2 diabetes mellitus with diabetic neuropathic arthropathy: Secondary | ICD-10-CM | POA: Diagnosis not present

## 2016-01-10 ENCOUNTER — Ambulatory Visit (INDEPENDENT_AMBULATORY_CARE_PROVIDER_SITE_OTHER): Payer: BLUE CROSS/BLUE SHIELD | Admitting: Family Medicine

## 2016-01-10 VITALS — BP 122/78 | HR 66 | Temp 98.2°F | Resp 16 | Ht 68.0 in | Wt 243.0 lb

## 2016-01-10 DIAGNOSIS — J019 Acute sinusitis, unspecified: Secondary | ICD-10-CM

## 2016-01-10 MED ORDER — HYDROCOD POLST-CPM POLST ER 10-8 MG/5ML PO SUER: 5.0000 mL | Freq: Two times a day (BID) | ORAL | Status: DC | PRN

## 2016-01-10 MED ORDER — AMOXICILLIN-POT CLAVULANATE 400-57 MG/5ML PO SUSR: 800.0000 mg | Freq: Two times a day (BID) | ORAL | Status: DC
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
## 2016-01-10 NOTE — Patient Instructions (Addendum)
   IF you received an x-ray today, you will receive an invoice from [facility]. Please contact [facility] at [phone] with questions or concerns regarding your invoice.   IF you received labwork today, you will receive an invoice from [facility]. Please contact [facility] at [phone] with questions or concerns regarding your invoice.   Our billing staff will not be able to assist you with questions regarding bills from these companies.  You will be contacted with the lab results as soon as they are available. The fastest way to get your results is to activate your My Chart account. Instructions are located on the last page of this paperwork. If you have not heard from us regarding the results in 2 weeks, please contact this office.     Sinusitis, Adult Sinusitis is redness, soreness, and inflammation of the paranasal sinuses. Paranasal sinuses are air pockets within the bones of your face. They are located beneath your eyes, in the middle of your forehead, and above your eyes. In healthy paranasal sinuses, mucus is able to drain out, and air is able to circulate through them by way of your nose. However, when your paranasal sinuses are inflamed, mucus and air can become trapped. This can allow bacteria and other germs to grow and cause infection. Sinusitis can develop quickly and last only a short time (acute) or continue over a long period (chronic). Sinusitis that lasts for more than 12 weeks is considered chronic. CAUSES Causes of sinusitis include:  Allergies.  Structural abnormalities, such as displacement of the cartilage that separates your nostrils (deviated septum), which can decrease the air flow through your nose and sinuses and affect sinus drainage.  Functional abnormalities, such as when the small hairs (cilia) that line your sinuses and help remove mucus do not work properly or are not present. SIGNS AND SYMPTOMS Symptoms  of acute and chronic sinusitis are the same. The primary symptoms are pain and pressure around the affected sinuses. Other symptoms include:  Upper toothache.  Earache.  Headache.  Bad breath.  Decreased sense of smell and taste.  A cough, which worsens when you are lying flat.  Fatigue.  Fever.  Thick drainage from your nose, which often is green and may contain pus (purulent).  Swelling and warmth over the affected sinuses. DIAGNOSIS Your health care provider will perform a physical exam. During your exam, your health care provider may perform any of the following to help determine if you have acute sinusitis or chronic sinusitis:  Look in your nose for signs of abnormal growths in your nostrils (nasal polyps).  Tap over the affected sinus to check for signs of infection.  View the inside of your sinuses using an imaging device that has a light attached (endoscope). If your health care provider suspects that you have chronic sinusitis, one or more of the following tests may be recommended:  Allergy tests.  Nasal culture. A sample of mucus is taken from your nose, sent to a lab, and screened for bacteria.  Nasal cytology. A sample of mucus is taken from your nose and examined by your health care provider to determine if your sinusitis is related to an allergy. TREATMENT Most cases of acute sinusitis are related to a viral infection and will resolve on their own within 10 days. Sometimes, medicines are prescribed to help relieve symptoms of both acute and chronic sinusitis. These may include pain medicines, decongestants, nasal steroid sprays, or saline sprays. However, for sinusitis related   to a bacterial infection, your health care provider will prescribe antibiotic medicines. These are medicines that will help kill the bacteria causing the infection. Rarely, sinusitis is caused by a fungal infection. In these cases, your health care provider will prescribe antifungal  medicine. For some cases of chronic sinusitis, surgery is needed. Generally, these are cases in which sinusitis recurs more than 3 times per year, despite other treatments. HOME CARE INSTRUCTIONS  Drink plenty of water. Water helps thin the mucus so your sinuses can drain more easily.  Use a humidifier.  Inhale steam 3-4 times a day (for example, sit in the bathroom with the shower running).  Apply a warm, moist washcloth to your face 3-4 times a day, or as directed by your health care provider.  Use saline nasal sprays to help moisten and clean your sinuses.  Take medicines only as directed by your health care provider.  If you were prescribed either an antibiotic or antifungal medicine, finish it all even if you start to feel better. SEEK IMMEDIATE MEDICAL CARE IF:  You have increasing pain or severe headaches.  You have nausea, vomiting, or drowsiness.  You have swelling around your face.  You have vision problems.  You have a stiff neck.  You have difficulty breathing.   This information is not intended to replace advice given to you by your health care provider. Make sure you discuss any questions you have with your health care provider.   Document Released: 08/13/2005 Document Revised: 09/03/2014 Document Reviewed: 08/28/2011 Elsevier Interactive Patient Education 2016 [employer] Inc[employer]  

## 2016-01-16 ENCOUNTER — Encounter: Payer: Self-pay | Admitting: Endocrinology

## 2016-01-16 ENCOUNTER — Telehealth: Payer: Self-pay | Admitting: Endocrinology

## 2016-01-16 ENCOUNTER — Ambulatory Visit (INDEPENDENT_AMBULATORY_CARE_PROVIDER_SITE_OTHER): Payer: BLUE CROSS/BLUE SHIELD | Admitting: Endocrinology

## 2016-01-16 VITALS — BP 136/84 | HR 79 | Temp 97.8°F | Wt 239.0 lb

## 2016-01-16 DIAGNOSIS — Z794 Long term (current) use of insulin: Secondary | ICD-10-CM

## 2016-01-16 DIAGNOSIS — E1142 Type 2 diabetes mellitus with diabetic polyneuropathy: Secondary | ICD-10-CM

## 2016-01-16 LAB — POCT GLYCOSYLATED HEMOGLOBIN (HGB A1C): HEMOGLOBIN A1C: 9.3

## 2016-01-16 MED ORDER — INSULIN ISOPHANE HUMAN 100 UNIT/ML KWIKPEN: 25.0000 [IU] | PEN_INJECTOR | SUBCUTANEOUS | Status: DC

## 2016-01-16 MED ORDER — SITAGLIPTIN PHOSPHATE 100 MG PO TABS: 100.0000 mg | ORAL_TABLET | Freq: Every day | ORAL | Status: DC

## 2016-01-16 MED ORDER — METFORMIN HCL 500 MG PO TABS: 500.0000 mg | ORAL_TABLET | Freq: Two times a day (BID) | ORAL | Status: DC

## 2016-01-16 NOTE — Telephone Encounter (Signed)
PT called and said the pharmacy told her that her new medication requires a PA and that she just wanted to remind [place] that since her Gastric Bypass surgery, her stomach is very sensitive to certain things that she has taken in the past.

## 2016-01-16 NOTE — Telephone Encounter (Signed)
See note below. Formulary has been placed on your desk to review.

## 2016-01-16 NOTE — Patient Instructions (Addendum)
Please reduce the metformin to 1 pill, twice a day i have sent a prescription to your pharmacy, to add januvia, and to change lantus to NPH. check your blood sugar twice a day.  vary the time of day when you check, between before the 3 meals, and at bedtime.  also check if you have symptoms of your blood sugar being too high or too low.  please keep a record of the readings and bring it to your next appointment here (or you can bring the meter itself).  You can write it on any piece of paper.  please call us sooner if your blood sugar goes below 70, or if you have a lot of readings over 200. Please come back for a follow-up appointment in 3 months
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
## 2016-01-17 MED ORDER — SAXAGLIPTIN HCL 5 MG PO TABS: 5.0000 mg | ORAL_TABLET | Freq: Every day | ORAL | Status: DC

## 2016-01-17 NOTE — Telephone Encounter (Signed)
Ins prefers onglyza over [place]. i have sent a prescription to your pharmacy

## 2016-01-18 MED ORDER — INSULIN NPH (HUMAN) (ISOPHANE) 100 UNIT/ML ~~LOC~~ SUSP: 25.0000 [IU] | SUBCUTANEOUS | Status: DC

## 2016-01-18 MED ORDER — "INSULIN SYRINGE-NEEDLE U-100 30G X 1/2"" 0.5 ML MISC": 1.0000 | Freq: Every day | Status: DC

## 2016-01-18 NOTE — Telephone Encounter (Signed)
I contacted the pt and advised of note below. Pt voiced understanding. Pt also wanted to advise the Humulin N is not covered under her insurance. Formulary printed and placed on your desk to review.

## 2016-01-18 NOTE — Addendum Note (Signed)
Addended by: Verlin Grills T on: 01/18/2016 01:54 PM   Modules accepted: Orders

## 2016-01-18 NOTE — Telephone Encounter (Signed)
Ok, i have sent a prescription to your pharmacy Please note this is not available in a pen--you would draw this from a bottle.

## 2016-01-18 NOTE — Addendum Note (Signed)
Addended by: Renato Shin on: 01/18/2016 11:53 AM   Modules accepted: Orders, Medications

## 2016-01-18 NOTE — Telephone Encounter (Signed)
I contacted pt and advised of note below. Pt voiced understanding.

## 2016-01-20 ENCOUNTER — Telehealth: Payer: Self-pay | Admitting: Endocrinology

## 2016-01-20 NOTE — Telephone Encounter (Signed)
i'll do PA for [place]

## 2016-01-24 DIAGNOSIS — Z7689 Persons encountering health services in other specified circumstances: Secondary | ICD-10-CM

## 2016-01-24 NOTE — Telephone Encounter (Signed)
PA placed on your desk to complete.

## 2016-01-24 NOTE — Telephone Encounter (Signed)
PA submitted to the pt's insurance company. Waiting on response.

## 2016-01-24 NOTE — Telephone Encounter (Signed)
done

## 2016-01-27 ENCOUNTER — Telehealth: Payer: Self-pay | Admitting: Endocrinology

## 2016-01-27 NOTE — Telephone Encounter (Signed)
Requested a call back from the pt to discuss.  

## 2016-01-27 NOTE — Telephone Encounter (Signed)
please call patient: Donna Kidd was declined.  They want you to take onglyza first. Are you taking that?

## 2016-01-31 NOTE — Telephone Encounter (Signed)
Attempted to reach the pt. Pt was unavailable. Will try again at a later time.  

## 2016-02-02 ENCOUNTER — Telehealth: Payer: Self-pay | Admitting: Endocrinology

## 2016-02-02 NOTE — Telephone Encounter (Signed)
[facility] # [phone]  pharm checking on status of the [place] PA

## 2016-02-03 NOTE — Telephone Encounter (Addendum)
I contacted the pt. Pt stated she is taking the onglyza right now and she stated its going well at this time. Pt stated she did not want to change at this time.

## 2016-02-03 NOTE — Telephone Encounter (Signed)
We are waiting on the pt to call back regarding the PA.

## 2016-02-06 DIAGNOSIS — E11610 Type 2 diabetes mellitus with diabetic neuropathic arthropathy: Secondary | ICD-10-CM | POA: Diagnosis not present

## 2016-02-06 DIAGNOSIS — E11621 Type 2 diabetes mellitus with foot ulcer: Secondary | ICD-10-CM | POA: Diagnosis not present

## 2016-02-06 DIAGNOSIS — L97421 Non-pressure chronic ulcer of left heel and midfoot limited to breakdown of skin: Secondary | ICD-10-CM | POA: Diagnosis not present

## 2016-02-06 DIAGNOSIS — S90822A Blister (nonthermal), left foot, initial encounter: Secondary | ICD-10-CM | POA: Diagnosis not present

## 2016-02-15 DIAGNOSIS — E11621 Type 2 diabetes mellitus with foot ulcer: Secondary | ICD-10-CM | POA: Diagnosis not present

## 2016-02-15 DIAGNOSIS — S90822A Blister (nonthermal), left foot, initial encounter: Secondary | ICD-10-CM | POA: Diagnosis not present

## 2016-02-15 DIAGNOSIS — E11610 Type 2 diabetes mellitus with diabetic neuropathic arthropathy: Secondary | ICD-10-CM | POA: Diagnosis not present

## 2016-02-15 DIAGNOSIS — L97422 Non-pressure chronic ulcer of left heel and midfoot with fat layer exposed: Secondary | ICD-10-CM | POA: Diagnosis not present

## 2016-02-21 DIAGNOSIS — H4311 Vitreous hemorrhage, right eye: Secondary | ICD-10-CM | POA: Diagnosis not present

## 2016-02-21 DIAGNOSIS — E113593 Type 2 diabetes mellitus with proliferative diabetic retinopathy without macular edema, bilateral: Secondary | ICD-10-CM | POA: Diagnosis not present

## 2016-02-21 DIAGNOSIS — H3563 Retinal hemorrhage, bilateral: Secondary | ICD-10-CM | POA: Diagnosis not present

## 2016-02-21 DIAGNOSIS — H4312 Vitreous hemorrhage, left eye: Secondary | ICD-10-CM | POA: Diagnosis not present

## 2016-02-23 DIAGNOSIS — L97422 Non-pressure chronic ulcer of left heel and midfoot with fat layer exposed: Secondary | ICD-10-CM | POA: Diagnosis not present

## 2016-02-23 DIAGNOSIS — E11621 Type 2 diabetes mellitus with foot ulcer: Secondary | ICD-10-CM | POA: Diagnosis not present

## 2016-02-23 DIAGNOSIS — E11610 Type 2 diabetes mellitus with diabetic neuropathic arthropathy: Secondary | ICD-10-CM | POA: Diagnosis not present

## 2016-03-05 DIAGNOSIS — L97422 Non-pressure chronic ulcer of left heel and midfoot with fat layer exposed: Secondary | ICD-10-CM | POA: Diagnosis not present

## 2016-03-05 DIAGNOSIS — E11610 Type 2 diabetes mellitus with diabetic neuropathic arthropathy: Secondary | ICD-10-CM | POA: Diagnosis not present

## 2016-03-05 DIAGNOSIS — E11621 Type 2 diabetes mellitus with foot ulcer: Secondary | ICD-10-CM | POA: Diagnosis not present

## 2016-03-06 DIAGNOSIS — H2512 Age-related nuclear cataract, left eye: Secondary | ICD-10-CM | POA: Diagnosis not present

## 2016-03-06 DIAGNOSIS — E113592 Type 2 diabetes mellitus with proliferative diabetic retinopathy without macular edema, left eye: Secondary | ICD-10-CM | POA: Diagnosis not present

## 2016-03-17 ENCOUNTER — Ambulatory Visit (INDEPENDENT_AMBULATORY_CARE_PROVIDER_SITE_OTHER): Payer: BLUE CROSS/BLUE SHIELD | Admitting: Family Medicine

## 2016-03-17 VITALS — BP 136/76 | HR 74 | Temp 98.3°F | Resp 18 | Ht 68.0 in | Wt 233.8 lb

## 2016-03-17 DIAGNOSIS — S91201A Unspecified open wound of right great toe with damage to nail, initial encounter: Secondary | ICD-10-CM | POA: Diagnosis not present

## 2016-03-17 DIAGNOSIS — W3400XA Accidental discharge from unspecified firearms or gun, initial encounter: Secondary | ICD-10-CM | POA: Diagnosis not present

## 2016-03-17 DIAGNOSIS — S91209A Unspecified open wound of unspecified toe(s) with damage to nail, initial encounter: Secondary | ICD-10-CM

## 2016-03-17 DIAGNOSIS — I10 Essential (primary) hypertension: Secondary | ICD-10-CM | POA: Diagnosis not present

## 2016-03-17 DIAGNOSIS — E11610 Type 2 diabetes mellitus with diabetic neuropathic arthropathy: Secondary | ICD-10-CM

## 2016-03-17 DIAGNOSIS — E1142 Type 2 diabetes mellitus with diabetic polyneuropathy: Secondary | ICD-10-CM | POA: Diagnosis not present

## 2016-03-17 MED ORDER — OXYCODONE-ACETAMINOPHEN 5-325 MG PO TABS: 1.0000 | ORAL_TABLET | Freq: Four times a day (QID) | ORAL | Status: DC | PRN

## 2016-03-17 MED ORDER — LISINOPRIL 2.5 MG PO TABS: 2.5000 mg | ORAL_TABLET | Freq: Every day | ORAL | Status: DC

## 2016-03-17 MED ORDER — GABAPENTIN 300 MG PO CAPS: ORAL_CAPSULE | ORAL | Status: DC
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
## 2016-03-17 NOTE — Patient Instructions (Addendum)
You can decrease the amitriptyline to 1/2 tab every night. At a low dose, your body will hopefully get used to it and then it can be gradually increased until it helps with the neuropathy pain.  Take 2 tabs of the gabapentin at night and 1 in the morning.  Cut your lisinopril into quarters. When you are done with that, a prescription for the 2.5mg  (wihich would be 1/8 of your current pill) is that pharmacy.   IF you received an x-ray today, you will receive an invoice from [facility]. Please contact [facility] at [phone] with questions or concerns regarding your invoice.   IF you received labwork today, you will receive an invoice from [employer]. Please contact [facility] at [phone] with questions or concerns regarding your invoice.   Our billing staff will not be able to assist you with questions regarding bills from these companies.  You will be contacted with the lab results as soon as they are available. The fastest way to get your results is to activate your My Chart account. Instructions are located on the last page of this paperwork. If you have not heard from [place] regarding the results in 2 weeks, please contact this office.    [facility] Policy for Prescribing Controlled Substances (Revised 06/2012) 1. Prescriptions for controlled substances will be filled by ONE provider at [facility] with whom you have established and developed a plan for your care, including follow-up. 2. You are encouraged to schedule an appointment with your prescriber at our appointment center for follow-up visits whenever possible. 3. If you request a prescription for the controlled substance while at [facility] for an acute problem (with someone other than your regular prescriber), you MAY be given a ONE-TIME prescription for a 30-day supply of the controlled substance, to allow time for you to return to see your regular prescriber for additional prescriptions.       4.  Call  at MINIMUM 72 hours in advance for refills on your medication.  You may be required to schedule an appointment for a refill if you have already received your allotted amount or there are other concerns.       5.  You will need to be seen every 3 months for refills at minimum. If you ever feel like you need a change in your dose or the administration, you will need to be seen in an office visit.       6.  You need to use the same pharmacy every month and you cannot get any controlled substances from any other clinic.   Nail Avulsion Injury Nail avulsion means that you have lost the whole, or part of a nail. The nail will usually grow back in 2 to 6 months. If your injury damaged the growth center of the nail, the nail may be deformed, split, or not stuck to the nail bed. Sometimes the avulsed nail is stitched back in place. This provides temporary protection to the nail bed until the new nail grows in.  HOME CARE INSTRUCTIONS   Raise (elevate) your injury as much as possible.  Protect the injury and cover it with bandages (dressings) or splints as instructed.  Change dressings as instructed. SEEK MEDICAL CARE IF:   There is increasing pain, redness, or swelling.  You cannot move your fingers or toes.   This information is not intended to replace advice given to you by your health care provider. Make sure you discuss any questions you have with  your health care provider.   Document Released: 09/20/2004 Document Revised: 11/05/2011 Document Reviewed: 07/15/2009 Elsevier Interactive Patient Education [employer].

## 2016-03-19 DIAGNOSIS — H4312 Vitreous hemorrhage, left eye: Secondary | ICD-10-CM | POA: Diagnosis not present

## 2016-03-19 DIAGNOSIS — H4311 Vitreous hemorrhage, right eye: Secondary | ICD-10-CM | POA: Diagnosis not present

## 2016-03-19 DIAGNOSIS — H3563 Retinal hemorrhage, bilateral: Secondary | ICD-10-CM | POA: Diagnosis not present

## 2016-03-19 DIAGNOSIS — E113593 Type 2 diabetes mellitus with proliferative diabetic retinopathy without macular edema, bilateral: Secondary | ICD-10-CM | POA: Diagnosis not present

## 2016-03-19 DIAGNOSIS — E113532 Type 2 diabetes mellitus with proliferative diabetic retinopathy with traction retinal detachment not involving the macula, left eye: Secondary | ICD-10-CM | POA: Diagnosis not present

## 2016-03-28 DIAGNOSIS — H35372 Puckering of macula, left eye: Secondary | ICD-10-CM | POA: Diagnosis not present

## 2016-03-28 DIAGNOSIS — E113532 Type 2 diabetes mellitus with proliferative diabetic retinopathy with traction retinal detachment not involving the macula, left eye: Secondary | ICD-10-CM | POA: Diagnosis not present

## 2016-03-28 DIAGNOSIS — H4312 Vitreous hemorrhage, left eye: Secondary | ICD-10-CM | POA: Diagnosis not present

## 2016-04-03 DIAGNOSIS — M7130 Other bursal cyst, unspecified site: Secondary | ICD-10-CM | POA: Diagnosis not present

## 2016-04-05 ENCOUNTER — Ambulatory Visit: Payer: BLUE CROSS/BLUE SHIELD | Admitting: Dietician

## 2016-04-08 ENCOUNTER — Encounter: Payer: Self-pay | Admitting: Endocrinology

## 2016-04-10 ENCOUNTER — Other Ambulatory Visit: Payer: Self-pay

## 2016-04-10 MED ORDER — INSULIN PEN NEEDLE 31G X 6 MM MISC: 2 refills | Status: DC

## 2016-04-11 ENCOUNTER — Telehealth: Payer: Self-pay | Admitting: Endocrinology

## 2016-04-11 ENCOUNTER — Ambulatory Visit (INDEPENDENT_AMBULATORY_CARE_PROVIDER_SITE_OTHER): Payer: BLUE CROSS/BLUE SHIELD | Admitting: Endocrinology

## 2016-04-11 ENCOUNTER — Encounter: Payer: Self-pay | Admitting: Endocrinology

## 2016-04-11 VITALS — BP 148/85 | HR 76 | Temp 98.7°F | Resp 14 | Ht 68.0 in | Wt 241.0 lb

## 2016-04-11 DIAGNOSIS — E1142 Type 2 diabetes mellitus with diabetic polyneuropathy: Secondary | ICD-10-CM

## 2016-04-11 DIAGNOSIS — Z794 Long term (current) use of insulin: Secondary | ICD-10-CM

## 2016-04-11 LAB — POCT GLYCOSYLATED HEMOGLOBIN (HGB A1C): HEMOGLOBIN A1C: 9

## 2016-04-11 MED ORDER — INSULIN PEN NEEDLE 31G X 8 MM MISC: 2 refills | Status: DC

## 2016-04-11 MED ORDER — INSULIN NPH (HUMAN) (ISOPHANE) 100 UNIT/ML ~~LOC~~ SUSP: 45.0000 [IU] | SUBCUTANEOUS | 11 refills | Status: DC

## 2016-04-11 NOTE — Patient Instructions (Addendum)
Please stop taking the metformin.   i have sent a prescription to your pharmacy, to increase the NPH insulin to 45 units each morning.   Please try to minimize the novolog.   check your blood sugar twice a day.  vary the time of day when you check, between before the 3 meals, and at bedtime.  also check if you have symptoms of your blood sugar being too high or too low.  please keep a record of the readings and bring it to your next appointment here (or you can bring the meter itself).  You can write it on any piece of paper.  please call us sooner if your blood sugar goes below 70, or if you have a lot of readings over 200.   Please come back for a follow-up appointment in 2 months.
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
## 2016-04-11 NOTE — Telephone Encounter (Signed)
Rx resubmitted with correct length of the pen needles.

## 2016-04-11 NOTE — Telephone Encounter (Signed)
[facility] called and wanted to verify the length of the pen needles for PT. Request call back.

## 2016-04-14 ENCOUNTER — Encounter (HOSPITAL_COMMUNITY): Payer: Self-pay

## 2016-04-14 ENCOUNTER — Emergency Department (HOSPITAL_COMMUNITY): Payer: BLUE CROSS/BLUE SHIELD

## 2016-04-14 ENCOUNTER — Emergency Department (HOSPITAL_COMMUNITY)
Admission: EM | Admit: 2016-04-14 | Discharge: 2016-04-14 | Disposition: A | Discharge status: Home / Self Care | Payer: BLUE CROSS/BLUE SHIELD | Attending: Emergency Medicine | Admitting: Emergency Medicine

## 2016-04-14 DIAGNOSIS — R0789 Other chest pain: Secondary | ICD-10-CM | POA: Insufficient documentation

## 2016-04-14 DIAGNOSIS — I10 Essential (primary) hypertension: Secondary | ICD-10-CM | POA: Insufficient documentation

## 2016-04-14 DIAGNOSIS — R079 Chest pain, unspecified: Secondary | ICD-10-CM | POA: Diagnosis not present

## 2016-04-14 DIAGNOSIS — E119 Type 2 diabetes mellitus without complications: Secondary | ICD-10-CM | POA: Diagnosis not present

## 2016-04-14 DIAGNOSIS — Z79899 Other long term (current) drug therapy: Secondary | ICD-10-CM | POA: Insufficient documentation

## 2016-04-14 DIAGNOSIS — R0602 Shortness of breath: Secondary | ICD-10-CM | POA: Diagnosis not present

## 2016-04-14 DIAGNOSIS — M546 Pain in thoracic spine: Secondary | ICD-10-CM | POA: Diagnosis not present

## 2016-04-14 DIAGNOSIS — Z7984 Long term (current) use of oral hypoglycemic drugs: Secondary | ICD-10-CM | POA: Diagnosis not present

## 2016-04-14 DIAGNOSIS — Z794 Long term (current) use of insulin: Secondary | ICD-10-CM | POA: Insufficient documentation

## 2016-04-14 DIAGNOSIS — R55 Syncope and collapse: Secondary | ICD-10-CM | POA: Diagnosis not present

## 2016-04-14 LAB — CBC WITH DIFFERENTIAL/PLATELET
BASOS PCT: 0 %
Basophils Absolute: 0 10*3/uL (ref 0.0–0.1)
EOS ABS: 0.1 10*3/uL (ref 0.0–0.7)
Eosinophils Relative: 0 %
HEMATOCRIT: 38.2 % (ref 36.0–46.0)
Hemoglobin: 12.8 g/dL (ref 12.0–15.0)
Lymphocytes Relative: 14 %
Lymphs Abs: 2.4 10*3/uL (ref 0.7–4.0)
MCH: 29 pg (ref 26.0–34.0)
MCHC: 33.5 g/dL (ref 30.0–36.0)
MCV: 86.4 fL (ref 78.0–100.0)
MONO ABS: 1.4 10*3/uL — AB (ref 0.1–1.0)
MONOS PCT: 8 %
Neutro Abs: 13.5 10*3/uL — ABNORMAL HIGH (ref 1.7–7.7)
Neutrophils Relative %: 78 %
Platelets: 275 10*3/uL (ref 150–400)
RBC: 4.42 MIL/uL (ref 3.87–5.11)
RDW: 13.7 % (ref 11.5–15.5)
WBC: 17.3 10*3/uL — ABNORMAL HIGH (ref 4.0–10.5)

## 2016-04-14 LAB — BASIC METABOLIC PANEL
ANION GAP: 10 (ref 5–15)
BUN: 21 mg/dL — ABNORMAL HIGH (ref 6–20)
CALCIUM: 9.4 mg/dL (ref 8.9–10.3)
CHLORIDE: 103 mmol/L (ref 101–111)
CO2: 23 mmol/L (ref 22–32)
Creatinine, Ser: 0.71 mg/dL (ref 0.44–1.00)
GFR calc non Af Amer: >60 mL/min (ref >60)
GLUCOSE: 292 mg/dL — AB (ref 65–99)
Potassium: 4.2 mmol/L (ref 3.5–5.1)
SODIUM: 136 mmol/L (ref 135–145)

## 2016-04-14 LAB — I-STAT TROPONIN, ED
Troponin i, poc: 0 ng/mL (ref 0.00–0.08)
Troponin i, poc: 0 ng/mL (ref 0.00–0.08)

## 2016-04-14 LAB — D-DIMER, QUANTITATIVE (NOT AT ARMC): D DIMER QUANT: 0.85 ug{FEU}/mL — AB (ref 0.00–0.50)

## 2016-04-14 MED ORDER — ALBUTEROL SULFATE (2.5 MG/3ML) 0.083% IN NEBU
5.0000 mg | INHALATION_SOLUTION | Freq: Once | RESPIRATORY_TRACT | Status: AC
  Administered 2016-04-14: 5 mg via RESPIRATORY_TRACT
  Filled 2016-04-14: qty 6

## 2016-04-14 MED ORDER — ASPIRIN 81 MG PO CHEW
324.0000 mg | CHEWABLE_TABLET | Freq: Once | ORAL | Status: AC
  Administered 2016-04-14: 324 mg via ORAL
  Filled 2016-04-14: qty 4

## 2016-04-14 MED ORDER — IOPAMIDOL (ISOVUE-370) INJECTION 76%
100.0000 mL | Freq: Once | INTRAVENOUS | Status: AC | PRN
  Administered 2016-04-14: 80 mL via INTRAVENOUS

## 2016-04-14 MED ORDER — MORPHINE SULFATE (PF) 4 MG/ML IV SOLN
4.0000 mg | Freq: Once | INTRAVENOUS | Status: AC
  Administered 2016-04-14: 4 mg via INTRAVENOUS
  Filled 2016-04-14: qty 1
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
## 2016-04-14 NOTE — ED Triage Notes (Addendum)
PT C/O SUDDEN ONSET OF SOB, NAUSEA, AND MID BACK PAIN WHILE WALKING IN THE STORE. PT STS SHE FEELS LIKE SHE CAN'T CATCH HER BREATH.

## 2016-04-14 NOTE — ED Notes (Signed)
RN starting IV, drawing labs 

## 2016-04-14 NOTE — ED Notes (Signed)
Respiratory called and will be doing breathing treatment.

## 2016-04-18 DIAGNOSIS — E11610 Type 2 diabetes mellitus with diabetic neuropathic arthropathy: Secondary | ICD-10-CM | POA: Diagnosis not present

## 2016-04-18 DIAGNOSIS — M7130 Other bursal cyst, unspecified site: Secondary | ICD-10-CM | POA: Diagnosis not present

## 2016-05-02 DIAGNOSIS — E11610 Type 2 diabetes mellitus with diabetic neuropathic arthropathy: Secondary | ICD-10-CM | POA: Diagnosis not present

## 2016-05-02 DIAGNOSIS — M7130 Other bursal cyst, unspecified site: Secondary | ICD-10-CM | POA: Diagnosis not present

## 2016-05-10 DIAGNOSIS — H2513 Age-related nuclear cataract, bilateral: Secondary | ICD-10-CM | POA: Diagnosis not present

## 2016-05-15 DIAGNOSIS — E119 Type 2 diabetes mellitus without complications: Secondary | ICD-10-CM | POA: Diagnosis not present

## 2016-05-15 DIAGNOSIS — Z9884 Bariatric surgery status: Secondary | ICD-10-CM | POA: Diagnosis not present

## 2016-05-22 DIAGNOSIS — E11610 Type 2 diabetes mellitus with diabetic neuropathic arthropathy: Secondary | ICD-10-CM | POA: Diagnosis not present

## 2016-05-22 DIAGNOSIS — E109 Type 1 diabetes mellitus without complications: Secondary | ICD-10-CM | POA: Diagnosis not present

## 2016-05-22 DIAGNOSIS — E1143 Type 2 diabetes mellitus with diabetic autonomic (poly)neuropathy: Secondary | ICD-10-CM | POA: Diagnosis not present

## 2016-05-22 DIAGNOSIS — I10 Essential (primary) hypertension: Secondary | ICD-10-CM | POA: Diagnosis not present

## 2016-05-22 DIAGNOSIS — E11621 Type 2 diabetes mellitus with foot ulcer: Secondary | ICD-10-CM | POA: Diagnosis not present

## 2016-05-22 DIAGNOSIS — S90822A Blister (nonthermal), left foot, initial encounter: Secondary | ICD-10-CM | POA: Diagnosis not present

## 2016-05-28 DIAGNOSIS — I10 Essential (primary) hypertension: Secondary | ICD-10-CM | POA: Diagnosis not present

## 2016-05-28 DIAGNOSIS — E782 Mixed hyperlipidemia: Secondary | ICD-10-CM | POA: Diagnosis not present

## 2016-05-28 DIAGNOSIS — K912 Postsurgical malabsorption, not elsewhere classified: Secondary | ICD-10-CM | POA: Diagnosis not present

## 2016-06-03 ENCOUNTER — Encounter: Payer: Self-pay | Admitting: Family Medicine

## 2016-06-04 ENCOUNTER — Encounter: Payer: BLUE CROSS/BLUE SHIELD | Attending: General Surgery | Admitting: Dietician

## 2016-06-04 ENCOUNTER — Encounter: Payer: Self-pay | Admitting: Dietician

## 2016-06-04 DIAGNOSIS — E11610 Type 2 diabetes mellitus with diabetic neuropathic arthropathy: Secondary | ICD-10-CM | POA: Diagnosis not present

## 2016-06-04 DIAGNOSIS — I10 Essential (primary) hypertension: Secondary | ICD-10-CM | POA: Diagnosis not present

## 2016-06-04 DIAGNOSIS — Z713 Dietary counseling and surveillance: Secondary | ICD-10-CM | POA: Insufficient documentation

## 2016-06-04 DIAGNOSIS — K912 Postsurgical malabsorption, not elsewhere classified: Secondary | ICD-10-CM | POA: Diagnosis not present

## 2016-06-04 DIAGNOSIS — Z794 Long term (current) use of insulin: Secondary | ICD-10-CM

## 2016-06-04 DIAGNOSIS — E669 Obesity, unspecified: Secondary | ICD-10-CM | POA: Insufficient documentation

## 2016-06-04 DIAGNOSIS — Z9884 Bariatric surgery status: Secondary | ICD-10-CM | POA: Diagnosis not present

## 2016-06-04 DIAGNOSIS — E782 Mixed hyperlipidemia: Secondary | ICD-10-CM | POA: Insufficient documentation

## 2016-06-04 DIAGNOSIS — Z6835 Body mass index (BMI) 35.0-35.9, adult: Secondary | ICD-10-CM | POA: Insufficient documentation

## 2016-06-04 DIAGNOSIS — E1142 Type 2 diabetes mellitus with diabetic polyneuropathy: Secondary | ICD-10-CM

## 2016-06-04 NOTE — Patient Instructions (Addendum)
Goals:   Try not to weigh every day!  Join [employer]  Floor and chair exercises  *Find some classes/exercises/trainers that you enjoy!   Non scale victories: flexibility, smaller calves and thighs, moving in the bed, bending down, able to wear pants that used to be too small, breathing easier, more stamina, not snoring, buying new clothes in smaller sizes, crossing legs!

## 2016-06-04 NOTE — Progress Notes (Signed)
  Follow-up visit: 13 months Post-Operative RYGB Surgery  Medical Nutrition Therapy:  Appt start time: 155 end time: 250  Primary concerns today: Post-operative Bariatric Surgery Nutrition Management.  Shayra returns having maintained her weight. Discouraged with slow weight loss. Plans to join [employer] this week. Has had steroid injections in foot which has caused fluid retention and hyperglycemia. Having to take insulin and this is causing difficulty losing weight. Continues to meet fluid needs.   Non scale victories: flexibility, smaller calves and thighs, moving in the bed, bending down, able to wear pants that used to be too small, breathing easier, more stamina, not snoring, buying new clothes in smaller sizes, crossing legs!  Goal: 100 lb loss by March 2018  Surgery date: 05/09/2015 Surgery type: RYGB Start weight at [facility]: 313 lbs on 03/05/15 (319 per patient) Weight today: 236.4 lbs Weight change: 1 lbs Total weight lost: 82.6 lbs  TANITA  BODY COMP RESULTS  04/04/15 05/31/15 07/06/15 10/12/15 01/04/16 06/04/16   BMI (kg/m^2) 48 43.0 N/A 37.9 N/A 35.9   Fat Mass (lbs) 138 138.5  118.5  109.4   Fat Free Mass (lbs) 177.5 144.0  131  127   Total Body Water (lbs) 130 105.5  96  92.4    Preferred Learning Style:   No preference indicated   Learning Readiness:   Ready  24-hr recall: B (AM): 2 [employer] fritattas (13g) Snk (AM): usually none, sometimes almonds L (12PM): broiled shrimp or fish and green beans  Snk (PM): raw veggies with homemade low fat spinach and artichoke dip D (6-8 PM): zucchini boat with ground [place] Snk (PM):   Goes to bed around 8:30-9 pm and having lows in the night. May eat some cheese in the night if she wakes up hungry. Drinks a juice box when blood sugar is low.  Fluid intake: Life water, water with sugar free flavoring, plain water, vitamin water zero, powerade zero + 0-11 oz protein shake, decaf coffee ("close to 64 oz") Estimated total protein intake:  60 grams a day per patient; 50 grams of carbs or less per patient  Medications: see list; reduced insulin  Supplementation: taking  CBG monitoring: 3-4x a day  Average CBG per patient: fasting about 155 mg/dL in the am (having lows at night 47-67 mg/dL)  Last patient reported A1c: 7.7%  Using straws: yes, not gas pain noted Drinking while eating: no Hair loss: yes, taking Biotin Carbonated beverages: none N/V/D/C: nausea with greasy foods, rarely vomits Dumping syndrome: none  Recent physical activity:  Stationary bike when she can  Progress Towards Goal(s):  In progress.   Nutritional Diagnosis:  [place]-3.3 Overweight/obesity related to past poor dietary habits and physical inactivity as evidenced by patient w/ recent RYGB surgery following dietary guidelines for continued weight loss.     Intervention:  Nutrition counseling provided.  Handout provided: none  Teaching Method Utilized:  Visual Auditory Hands on  Barriers to learning/adherence to lifestyle change: foot injury  Demonstrated degree of understanding via:  Teach Back   Monitoring/Evaluation:  Dietary intake, exercise, and body weight. Follow up in 3 months for 16 month post-op visit.

## 2016-06-07 DIAGNOSIS — M14672 Charcot's joint, left ankle and foot: Secondary | ICD-10-CM | POA: Diagnosis not present

## 2016-06-07 DIAGNOSIS — E11610 Type 2 diabetes mellitus with diabetic neuropathic arthropathy: Secondary | ICD-10-CM | POA: Diagnosis not present

## 2016-06-11 DIAGNOSIS — L03116 Cellulitis of left lower limb: Secondary | ICD-10-CM | POA: Diagnosis not present

## 2016-06-11 DIAGNOSIS — L97429 Non-pressure chronic ulcer of left heel and midfoot with unspecified severity: Secondary | ICD-10-CM | POA: Diagnosis not present

## 2016-06-11 DIAGNOSIS — E11610 Type 2 diabetes mellitus with diabetic neuropathic arthropathy: Secondary | ICD-10-CM | POA: Diagnosis not present

## 2016-06-11 DIAGNOSIS — E08621 Diabetes mellitus due to underlying condition with foot ulcer: Secondary | ICD-10-CM | POA: Diagnosis not present

## 2016-06-11 DIAGNOSIS — L02612 Cutaneous abscess of left foot: Secondary | ICD-10-CM | POA: Diagnosis not present

## 2016-06-11 DIAGNOSIS — I10 Essential (primary) hypertension: Secondary | ICD-10-CM | POA: Diagnosis not present

## 2016-06-11 DIAGNOSIS — E1143 Type 2 diabetes mellitus with diabetic autonomic (poly)neuropathy: Secondary | ICD-10-CM | POA: Diagnosis not present

## 2016-06-19 DIAGNOSIS — L97422 Non-pressure chronic ulcer of left heel and midfoot with fat layer exposed: Secondary | ICD-10-CM | POA: Diagnosis not present

## 2016-06-19 DIAGNOSIS — E11621 Type 2 diabetes mellitus with foot ulcer: Secondary | ICD-10-CM | POA: Diagnosis not present

## 2016-06-19 DIAGNOSIS — L97429 Non-pressure chronic ulcer of left heel and midfoot with unspecified severity: Secondary | ICD-10-CM | POA: Diagnosis not present

## 2016-06-19 DIAGNOSIS — Z6835 Body mass index (BMI) 35.0-35.9, adult: Secondary | ICD-10-CM | POA: Diagnosis not present

## 2016-06-19 DIAGNOSIS — Z79899 Other long term (current) drug therapy: Secondary | ICD-10-CM | POA: Diagnosis not present

## 2016-06-19 DIAGNOSIS — L03116 Cellulitis of left lower limb: Secondary | ICD-10-CM | POA: Diagnosis not present

## 2016-06-19 DIAGNOSIS — M71372 Other bursal cyst, left ankle and foot: Secondary | ICD-10-CM | POA: Diagnosis not present

## 2016-06-19 DIAGNOSIS — E669 Obesity, unspecified: Secondary | ICD-10-CM | POA: Diagnosis not present

## 2016-06-28 DIAGNOSIS — Z794 Long term (current) use of insulin: Secondary | ICD-10-CM | POA: Diagnosis not present

## 2016-06-28 DIAGNOSIS — I10 Essential (primary) hypertension: Secondary | ICD-10-CM | POA: Diagnosis not present

## 2016-06-28 DIAGNOSIS — E1143 Type 2 diabetes mellitus with diabetic autonomic (poly)neuropathy: Secondary | ICD-10-CM | POA: Diagnosis not present

## 2016-06-28 DIAGNOSIS — E11610 Type 2 diabetes mellitus with diabetic neuropathic arthropathy: Secondary | ICD-10-CM | POA: Diagnosis not present

## 2016-07-11 MED ORDER — OXYCODONE-ACETAMINOPHEN 5-325 MG PO TABS: 1.0000 | ORAL_TABLET | Freq: Four times a day (QID) | ORAL | 0 refills | Status: DC | PRN

## 2016-07-11 NOTE — Telephone Encounter (Signed)
Pt called back to ask about this Rx that she req'd over a month ago. See pt's Mychart message attached to this enc. I had Stanton Kidney apologize to her and advised that although it had been sent to Dr Brigitte Pulse, it must have disappeared from her in basket because it would not take her this long to respond to request. Dr Brigitte Pulse, I'm marking this high priority now since pt has been so patient to wait so long for her Rx. Her last OV was 7/22 and you gave her #60 tablets then. We need to call pt on her cell phone once this is ready.

## 2016-07-11 NOTE — Telephone Encounter (Signed)
Will refill pain medicine one time so call pt to pick up.  Please remind pt that if she is getting this from me to be sure not to be getting from surgeons as well.  Needs OV for any additional refills.

## 2016-07-12 NOTE — Telephone Encounter (Signed)
Spoke to pt who agreed to RTC before she needs more. She reported that when she had her surgery in Oct that Dr Prudy Feeler gave her just a few tablets of a pain med because she hadn't gotten her refill from Dr Brigitte Pulse at that time, but she finished those a while ago. He will not be Rxing any more.

## 2016-07-13 DIAGNOSIS — Z4789 Encounter for other orthopedic aftercare: Secondary | ICD-10-CM | POA: Diagnosis not present

## 2016-07-13 DIAGNOSIS — E11610 Type 2 diabetes mellitus with diabetic neuropathic arthropathy: Secondary | ICD-10-CM | POA: Diagnosis not present

## 2016-07-13 DIAGNOSIS — M7130 Other bursal cyst, unspecified site: Secondary | ICD-10-CM | POA: Diagnosis not present

## 2016-07-21 IMAGING — CR DG UGI W/ GASTROGRAFIN
1 series · 1 of 1 positions shown · IV contrast (omnipaque)
Comparison: Upper GI 02/09/2015

CLINICAL DATA: Patient status post Roux-en-Y gastric bypass
surgery. One day post op.

EXAM:
WATER SOLUBLE UPPER GI SERIES
TECHNIQUE: Single-column upper GI series was performed using water soluble
contrast.
CONTRAST:  50mL OMNIPAQUE IOHEXOL 300 MG/ML  SOLN

[view not recorded]
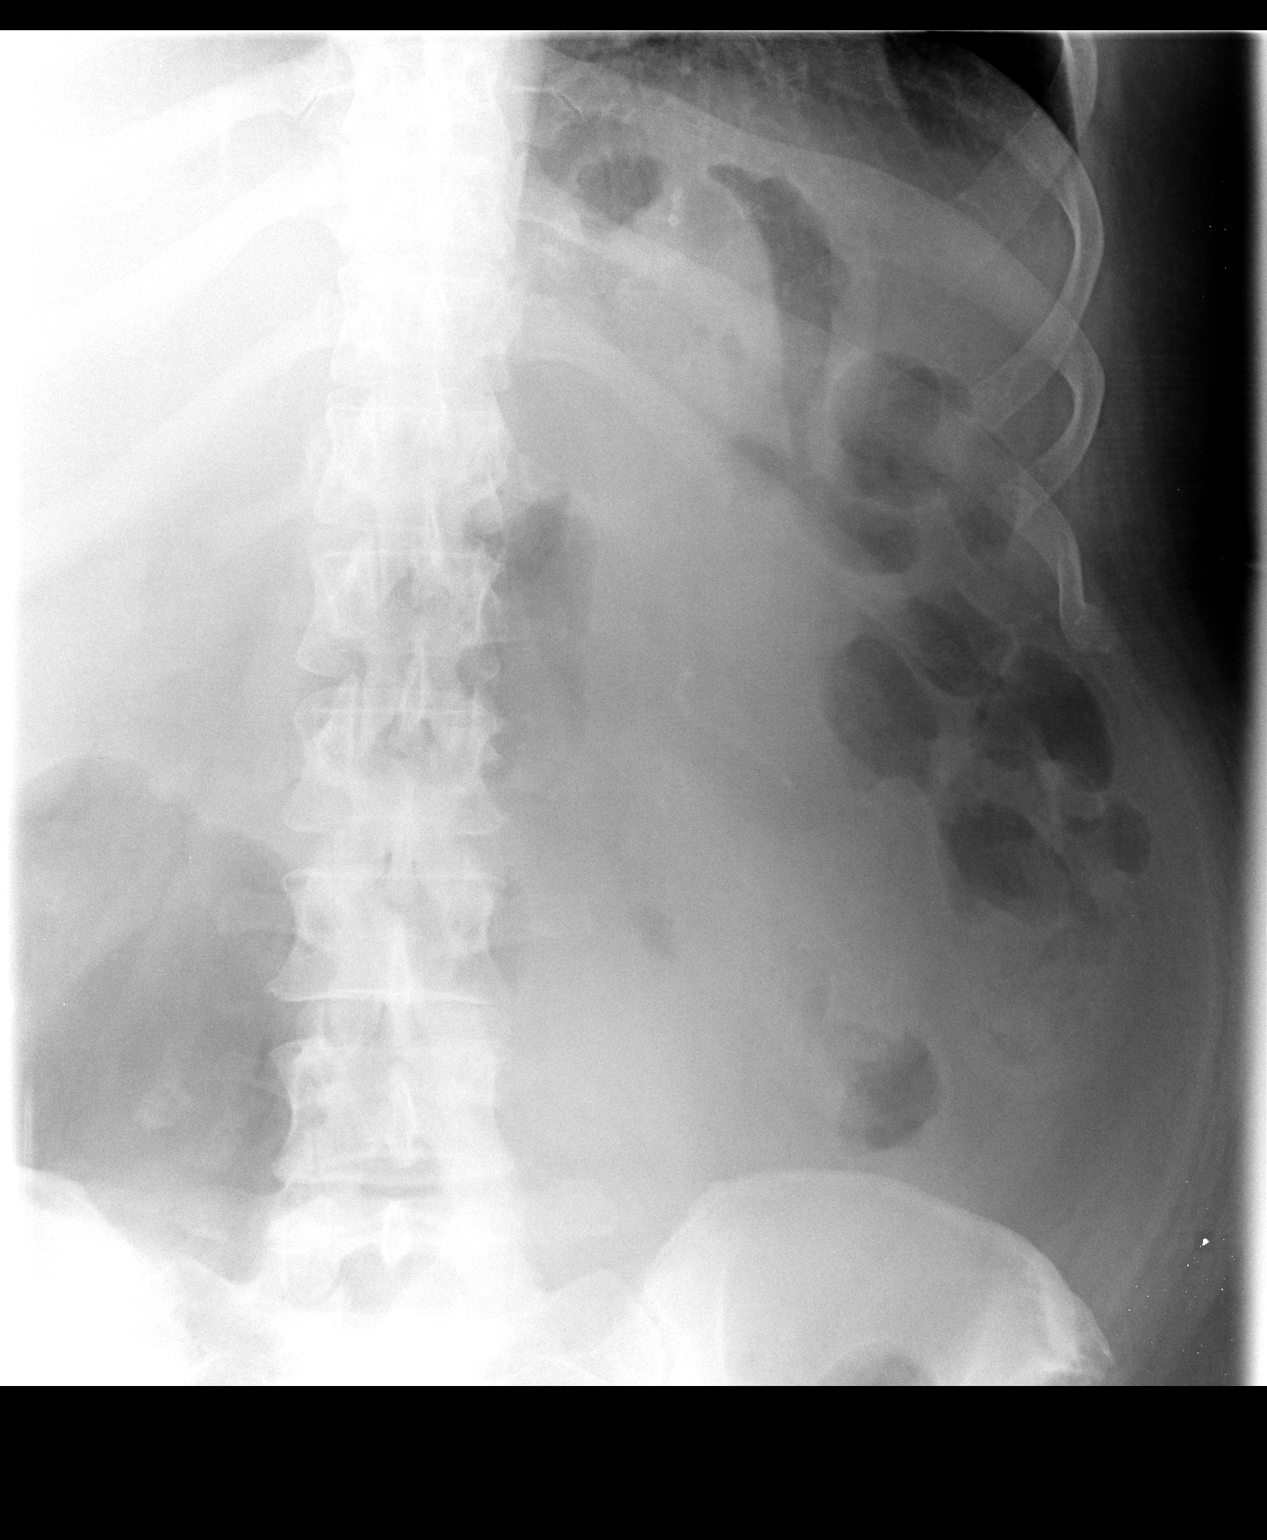

[1 of 1 positions shown; findings below may reference images not displayed]

FLUOROSCOPY TIME:  Radiation Exposure Index (as provided by the
fluoroscopic device):

If the device does not provide the exposure index:

Fluoroscopy Time (in minutes and seconds):  53 seconds

Number of Acquired Images:  9
FINDINGS: 50 cc of Omnipaque or ingested orally. Contrast flowed through the
distal esophagus into the gastric remnant without obstruction.
Contrast readily flowed through the gastrojejunostomy without
obstruction or leak.No retention within the esophagus.
IMPRESSION: No evidence obstruction or leak at the gastrojejunostomy following
bariatric Roux-en-Y gastric bypass.

## 2016-07-31 DIAGNOSIS — L97422 Non-pressure chronic ulcer of left heel and midfoot with fat layer exposed: Secondary | ICD-10-CM | POA: Diagnosis not present

## 2016-07-31 DIAGNOSIS — E11610 Type 2 diabetes mellitus with diabetic neuropathic arthropathy: Secondary | ICD-10-CM | POA: Diagnosis not present

## 2016-07-31 DIAGNOSIS — E11621 Type 2 diabetes mellitus with foot ulcer: Secondary | ICD-10-CM | POA: Diagnosis not present

## 2016-07-31 DIAGNOSIS — I10 Essential (primary) hypertension: Secondary | ICD-10-CM | POA: Diagnosis not present

## 2016-08-24 DIAGNOSIS — G629 Polyneuropathy, unspecified: Secondary | ICD-10-CM | POA: Diagnosis not present

## 2016-08-24 DIAGNOSIS — E11621 Type 2 diabetes mellitus with foot ulcer: Secondary | ICD-10-CM | POA: Diagnosis not present

## 2016-08-24 DIAGNOSIS — Z881 Allergy status to other antibiotic agents status: Secondary | ICD-10-CM | POA: Diagnosis not present

## 2016-08-24 DIAGNOSIS — G8918 Other acute postprocedural pain: Secondary | ICD-10-CM | POA: Diagnosis not present

## 2016-08-24 DIAGNOSIS — Z9884 Bariatric surgery status: Secondary | ICD-10-CM | POA: Diagnosis not present

## 2016-08-24 DIAGNOSIS — Z882 Allergy status to sulfonamides status: Secondary | ICD-10-CM | POA: Diagnosis not present

## 2016-08-24 DIAGNOSIS — I10 Essential (primary) hypertension: Secondary | ICD-10-CM | POA: Diagnosis not present

## 2016-08-24 DIAGNOSIS — M14672 Charcot's joint, left ankle and foot: Secondary | ICD-10-CM | POA: Diagnosis not present

## 2016-08-24 DIAGNOSIS — Z794 Long term (current) use of insulin: Secondary | ICD-10-CM | POA: Diagnosis not present

## 2016-08-24 DIAGNOSIS — Z888 Allergy status to other drugs, medicaments and biological substances status: Secondary | ICD-10-CM | POA: Diagnosis not present

## 2016-08-24 DIAGNOSIS — L97422 Non-pressure chronic ulcer of left heel and midfoot with fat layer exposed: Secondary | ICD-10-CM | POA: Diagnosis not present

## 2016-08-25 ENCOUNTER — Ambulatory Visit (INDEPENDENT_AMBULATORY_CARE_PROVIDER_SITE_OTHER): Payer: BLUE CROSS/BLUE SHIELD | Admitting: Family Medicine

## 2016-08-25 VITALS — BP 144/76 | HR 83 | Temp 98.0°F | Resp 16 | Ht 68.0 in | Wt 226.0 lb

## 2016-08-25 DIAGNOSIS — L7682 Other postprocedural complications of skin and subcutaneous tissue: Secondary | ICD-10-CM | POA: Diagnosis not present

## 2016-08-25 NOTE — Patient Instructions (Signed)
     IF you received an x-ray today, you will receive an invoice from [facility]. Please contact [facility] at [phone] with questions or concerns regarding your invoice.   IF you received labwork today, you will receive an invoice from [facility]. Please contact [facility] at [phone] with questions or concerns regarding your invoice.   Our billing staff will not be able to assist you with questions regarding bills from these companies.  You will be contacted with the lab results as soon as they are available. The fastest way to get your results is to activate your My Chart account. Instructions are located on the last page of this paperwork. If you have not heard from us regarding the results in 2 weeks, please contact this office.     
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
## 2016-09-03 ENCOUNTER — Ambulatory Visit: Payer: BLUE CROSS/BLUE SHIELD | Admitting: Dietician

## 2016-09-14 DIAGNOSIS — Z4789 Encounter for other orthopedic aftercare: Secondary | ICD-10-CM | POA: Diagnosis not present

## 2016-09-19 DIAGNOSIS — E11621 Type 2 diabetes mellitus with foot ulcer: Secondary | ICD-10-CM | POA: Diagnosis not present

## 2016-09-19 DIAGNOSIS — L97422 Non-pressure chronic ulcer of left heel and midfoot with fat layer exposed: Secondary | ICD-10-CM | POA: Diagnosis not present

## 2016-09-19 DIAGNOSIS — F4323 Adjustment disorder with mixed anxiety and depressed mood: Secondary | ICD-10-CM | POA: Diagnosis not present

## 2016-09-24 DIAGNOSIS — H4311 Vitreous hemorrhage, right eye: Secondary | ICD-10-CM | POA: Diagnosis not present

## 2016-09-24 DIAGNOSIS — H2512 Age-related nuclear cataract, left eye: Secondary | ICD-10-CM | POA: Diagnosis not present

## 2016-09-24 DIAGNOSIS — H3563 Retinal hemorrhage, bilateral: Secondary | ICD-10-CM | POA: Diagnosis not present

## 2016-09-24 DIAGNOSIS — E113553 Type 2 diabetes mellitus with stable proliferative diabetic retinopathy, bilateral: Secondary | ICD-10-CM | POA: Diagnosis not present

## 2016-09-26 DIAGNOSIS — F4323 Adjustment disorder with mixed anxiety and depressed mood: Secondary | ICD-10-CM | POA: Diagnosis not present

## 2016-09-27 ENCOUNTER — Ambulatory Visit (INDEPENDENT_AMBULATORY_CARE_PROVIDER_SITE_OTHER): Payer: BLUE CROSS/BLUE SHIELD | Admitting: Family Medicine

## 2016-09-27 ENCOUNTER — Encounter: Payer: Self-pay | Admitting: Family Medicine

## 2016-09-27 VITALS — BP 129/79 | HR 81 | Temp 98.5°F | Resp 18 | Ht 68.0 in | Wt 226.0 lb

## 2016-09-27 DIAGNOSIS — G894 Chronic pain syndrome: Secondary | ICD-10-CM | POA: Diagnosis not present

## 2016-09-27 DIAGNOSIS — F4323 Adjustment disorder with mixed anxiety and depressed mood: Secondary | ICD-10-CM

## 2016-09-27 DIAGNOSIS — G4701 Insomnia due to medical condition: Secondary | ICD-10-CM | POA: Diagnosis not present

## 2016-09-27 MED ORDER — AMITRIPTYLINE HCL 50 MG PO TABS: 50.0000 mg | ORAL_TABLET | Freq: Every day | ORAL | 1 refills | Status: DC

## 2016-09-27 MED ORDER — BUPROPION HCL 100 MG PO TABS: 100.0000 mg | ORAL_TABLET | Freq: Two times a day (BID) | ORAL | 1 refills | Status: DC
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
## 2016-09-27 NOTE — Patient Instructions (Addendum)
 IF you received an x-ray today, you will receive an invoice from [facility]. Please contact [facility] at [phone] with questions or concerns regarding your invoice.   IF you received labwork today, you will receive an invoice from [facility]. Please contact [facility] at [phone] with questions or concerns regarding your invoice.   Our billing staff will not be able to assist you with questions regarding bills from these companies.  You will be contacted with the lab results as soon as they are available. The fastest way to get your results is to activate your My Chart account. Instructions are located on the last page of this paperwork. If you have not heard from us regarding the results in 2 weeks, please contact this office.     Chronic Pain, Adult Chronic pain is a type of pain that lasts or keeps coming back (recurs) for at least six months. You may have chronic headaches, abdominal pain, or body pain. Chronic pain may be related to an illness, such as fibromyalgia or complex regional pain syndrome. Sometimes the cause of chronic pain is not known. Chronic pain can make it hard for you to do daily activities. If not treated, chronic pain can lead to other health problems, including anxiety and depression. Treatment depends on the cause and severity of your pain. You may need to work with a pain specialist to come up with a treatment plan. The plan may include medicine, counseling, and physical therapy. Many people benefit from a combination of two or more types of treatment to control their pain. Follow these instructions at home: Lifestyle  Consider keeping a pain diary to share with your health care providers.  Consider talking with a mental health care provider (psychologist) about how to cope with chronic pain.  Consider joining a chronic pain support group.  Try to control or lower your stress levels. Talk to your health care provider about strategies to  do this. General instructions   Take over-the-counter and prescription medicines only as told by your health care provider.  Follow your treatment plan as told by your health care provider. This may include: ? Gentle, regular exercise. ? Eating a healthy diet that includes foods such as vegetables, fruits, fish, and lean meats. ? Cognitive or behavioral therapy. ? Working with a physical therapist. ? Meditation or yoga. ? Acupuncture or massage therapy. ? Aroma, color, light, or sound therapy. ? Local electrical stimulation. ? Shots (injections) of numbing or pain-relieving medicines into the spine or the area of pain.  Check your pain level as told by your health care provider. Ask your health care provider if you should use a pain scale.  Learn as much as you can about how to manage your chronic pain. Ask your health care provider if an intensive pain rehabilitation program or a chronic pain specialist would be helpful.  Keep all follow-up visits as told by your health care provider. This is important. Contact a health care provider if:  Your pain gets worse.  You have new pain.  You have trouble sleeping.  You have trouble doing your normal activities.  Your pain is not controlled with treatment.  Your have side effects from pain medicine.  You feel weak. Get help right away if:  You lose feeling or have numbness in your body.  You lose control of bowel or bladder function.  Your pain suddenly gets much worse.  You develop shaking or chills.  You develop confusion.  You develop chest pain.    You have trouble breathing or shortness of breath.  You pass out.  You have thoughts about hurting yourself or others. This information is not intended to replace advice given to you by your health care provider. Make sure you discuss any questions you have with your health care provider. Document Released: 05/05/2002 Document Revised: 04/12/2016 Document Reviewed:  01/31/2016 Elsevier Interactive Patient Education  2017 Elsevier Inc.   

## 2016-10-10 DIAGNOSIS — F4323 Adjustment disorder with mixed anxiety and depressed mood: Secondary | ICD-10-CM | POA: Diagnosis not present

## 2016-10-15 DIAGNOSIS — E11621 Type 2 diabetes mellitus with foot ulcer: Secondary | ICD-10-CM | POA: Diagnosis not present

## 2016-10-15 DIAGNOSIS — E109 Type 1 diabetes mellitus without complications: Secondary | ICD-10-CM | POA: Diagnosis not present

## 2016-10-15 DIAGNOSIS — L97422 Non-pressure chronic ulcer of left heel and midfoot with fat layer exposed: Secondary | ICD-10-CM | POA: Diagnosis not present

## 2016-10-15 DIAGNOSIS — E11610 Type 2 diabetes mellitus with diabetic neuropathic arthropathy: Secondary | ICD-10-CM | POA: Diagnosis not present

## 2016-10-15 DIAGNOSIS — I10 Essential (primary) hypertension: Secondary | ICD-10-CM | POA: Diagnosis not present

## 2016-10-15 DIAGNOSIS — E1143 Type 2 diabetes mellitus with diabetic autonomic (poly)neuropathy: Secondary | ICD-10-CM | POA: Diagnosis not present

## 2016-11-02 DIAGNOSIS — L97422 Non-pressure chronic ulcer of left heel and midfoot with fat layer exposed: Secondary | ICD-10-CM | POA: Diagnosis not present

## 2016-11-02 DIAGNOSIS — E11621 Type 2 diabetes mellitus with foot ulcer: Secondary | ICD-10-CM | POA: Diagnosis not present

## 2016-11-05 DIAGNOSIS — H524 Presbyopia: Secondary | ICD-10-CM | POA: Diagnosis not present

## 2016-11-05 DIAGNOSIS — E113593 Type 2 diabetes mellitus with proliferative diabetic retinopathy without macular edema, bilateral: Secondary | ICD-10-CM | POA: Diagnosis not present

## 2016-11-08 DIAGNOSIS — L97421 Non-pressure chronic ulcer of left heel and midfoot limited to breakdown of skin: Secondary | ICD-10-CM | POA: Diagnosis not present

## 2016-11-12 DIAGNOSIS — H2512 Age-related nuclear cataract, left eye: Secondary | ICD-10-CM | POA: Diagnosis not present

## 2016-11-12 DIAGNOSIS — T8131XA Disruption of external operation (surgical) wound, not elsewhere classified, initial encounter: Secondary | ICD-10-CM | POA: Diagnosis not present

## 2016-11-12 DIAGNOSIS — H2511 Age-related nuclear cataract, right eye: Secondary | ICD-10-CM | POA: Diagnosis not present

## 2016-11-12 DIAGNOSIS — I10 Essential (primary) hypertension: Secondary | ICD-10-CM | POA: Diagnosis not present

## 2016-11-12 DIAGNOSIS — L97422 Non-pressure chronic ulcer of left heel and midfoot with fat layer exposed: Secondary | ICD-10-CM | POA: Diagnosis not present

## 2016-11-12 DIAGNOSIS — E11621 Type 2 diabetes mellitus with foot ulcer: Secondary | ICD-10-CM | POA: Diagnosis not present

## 2016-11-12 DIAGNOSIS — E1143 Type 2 diabetes mellitus with diabetic autonomic (poly)neuropathy: Secondary | ICD-10-CM | POA: Diagnosis not present

## 2016-11-12 DIAGNOSIS — E11610 Type 2 diabetes mellitus with diabetic neuropathic arthropathy: Secondary | ICD-10-CM | POA: Diagnosis not present

## 2016-11-12 DIAGNOSIS — E113553 Type 2 diabetes mellitus with stable proliferative diabetic retinopathy, bilateral: Secondary | ICD-10-CM | POA: Diagnosis not present

## 2016-11-12 DIAGNOSIS — H3563 Retinal hemorrhage, bilateral: Secondary | ICD-10-CM | POA: Diagnosis not present

## 2016-11-22 DIAGNOSIS — L97422 Non-pressure chronic ulcer of left heel and midfoot with fat layer exposed: Secondary | ICD-10-CM | POA: Diagnosis not present

## 2016-11-22 DIAGNOSIS — I10 Essential (primary) hypertension: Secondary | ICD-10-CM | POA: Diagnosis not present

## 2016-11-22 DIAGNOSIS — E11621 Type 2 diabetes mellitus with foot ulcer: Secondary | ICD-10-CM | POA: Diagnosis not present

## 2016-12-06 DIAGNOSIS — I10 Essential (primary) hypertension: Secondary | ICD-10-CM | POA: Diagnosis not present

## 2016-12-06 DIAGNOSIS — E11610 Type 2 diabetes mellitus with diabetic neuropathic arthropathy: Secondary | ICD-10-CM | POA: Diagnosis not present

## 2016-12-06 DIAGNOSIS — L97422 Non-pressure chronic ulcer of left heel and midfoot with fat layer exposed: Secondary | ICD-10-CM | POA: Diagnosis not present

## 2016-12-06 DIAGNOSIS — E11621 Type 2 diabetes mellitus with foot ulcer: Secondary | ICD-10-CM | POA: Diagnosis not present

## 2016-12-13 DIAGNOSIS — E11621 Type 2 diabetes mellitus with foot ulcer: Secondary | ICD-10-CM | POA: Diagnosis not present

## 2016-12-13 DIAGNOSIS — L97421 Non-pressure chronic ulcer of left heel and midfoot limited to breakdown of skin: Secondary | ICD-10-CM | POA: Diagnosis not present

## 2016-12-19 DIAGNOSIS — E11621 Type 2 diabetes mellitus with foot ulcer: Secondary | ICD-10-CM | POA: Diagnosis not present

## 2016-12-19 DIAGNOSIS — E1165 Type 2 diabetes mellitus with hyperglycemia: Secondary | ICD-10-CM | POA: Diagnosis not present

## 2016-12-19 DIAGNOSIS — E11610 Type 2 diabetes mellitus with diabetic neuropathic arthropathy: Secondary | ICD-10-CM | POA: Diagnosis not present

## 2016-12-19 DIAGNOSIS — L97421 Non-pressure chronic ulcer of left heel and midfoot limited to breakdown of skin: Secondary | ICD-10-CM | POA: Diagnosis not present

## 2016-12-25 HISTORY — PX: EYE SURGERY: SHX253

## 2016-12-26 DIAGNOSIS — L97421 Non-pressure chronic ulcer of left heel and midfoot limited to breakdown of skin: Secondary | ICD-10-CM | POA: Diagnosis not present

## 2016-12-31 DIAGNOSIS — E11621 Type 2 diabetes mellitus with foot ulcer: Secondary | ICD-10-CM | POA: Diagnosis not present

## 2016-12-31 DIAGNOSIS — L97421 Non-pressure chronic ulcer of left heel and midfoot limited to breakdown of skin: Secondary | ICD-10-CM | POA: Diagnosis not present

## 2016-12-31 DIAGNOSIS — I10 Essential (primary) hypertension: Secondary | ICD-10-CM | POA: Diagnosis not present

## 2016-12-31 DIAGNOSIS — E11610 Type 2 diabetes mellitus with diabetic neuropathic arthropathy: Secondary | ICD-10-CM | POA: Diagnosis not present

## 2017-01-12 ENCOUNTER — Other Ambulatory Visit: Payer: Self-pay | Admitting: Family Medicine

## 2017-01-14 DIAGNOSIS — H3563 Retinal hemorrhage, bilateral: Secondary | ICD-10-CM | POA: Diagnosis not present

## 2017-01-14 DIAGNOSIS — H2513 Age-related nuclear cataract, bilateral: Secondary | ICD-10-CM | POA: Diagnosis not present

## 2017-01-14 DIAGNOSIS — E113552 Type 2 diabetes mellitus with stable proliferative diabetic retinopathy, left eye: Secondary | ICD-10-CM | POA: Diagnosis not present

## 2017-01-14 DIAGNOSIS — E113591 Type 2 diabetes mellitus with proliferative diabetic retinopathy without macular edema, right eye: Secondary | ICD-10-CM | POA: Diagnosis not present

## 2017-01-15 DIAGNOSIS — E1143 Type 2 diabetes mellitus with diabetic autonomic (poly)neuropathy: Secondary | ICD-10-CM | POA: Diagnosis not present

## 2017-01-15 DIAGNOSIS — E1142 Type 2 diabetes mellitus with diabetic polyneuropathy: Secondary | ICD-10-CM | POA: Diagnosis not present

## 2017-01-15 DIAGNOSIS — E11610 Type 2 diabetes mellitus with diabetic neuropathic arthropathy: Secondary | ICD-10-CM | POA: Diagnosis not present

## 2017-01-15 DIAGNOSIS — E11621 Type 2 diabetes mellitus with foot ulcer: Secondary | ICD-10-CM | POA: Diagnosis not present

## 2017-01-15 DIAGNOSIS — I10 Essential (primary) hypertension: Secondary | ICD-10-CM | POA: Diagnosis not present

## 2017-01-15 DIAGNOSIS — L97421 Non-pressure chronic ulcer of left heel and midfoot limited to breakdown of skin: Secondary | ICD-10-CM | POA: Diagnosis not present

## 2017-01-25 DIAGNOSIS — E113591 Type 2 diabetes mellitus with proliferative diabetic retinopathy without macular edema, right eye: Secondary | ICD-10-CM | POA: Diagnosis not present

## 2017-01-28 DIAGNOSIS — L97421 Non-pressure chronic ulcer of left heel and midfoot limited to breakdown of skin: Secondary | ICD-10-CM | POA: Diagnosis not present

## 2017-01-28 DIAGNOSIS — E11610 Type 2 diabetes mellitus with diabetic neuropathic arthropathy: Secondary | ICD-10-CM | POA: Diagnosis not present

## 2017-01-28 DIAGNOSIS — I10 Essential (primary) hypertension: Secondary | ICD-10-CM | POA: Diagnosis not present

## 2017-01-28 DIAGNOSIS — E11621 Type 2 diabetes mellitus with foot ulcer: Secondary | ICD-10-CM | POA: Diagnosis not present

## 2017-01-28 DIAGNOSIS — E1143 Type 2 diabetes mellitus with diabetic autonomic (poly)neuropathy: Secondary | ICD-10-CM | POA: Diagnosis not present

## 2017-02-01 ENCOUNTER — Encounter: Payer: Self-pay | Admitting: Endocrinology

## 2017-02-01 ENCOUNTER — Other Ambulatory Visit: Payer: Self-pay

## 2017-02-01 MED ORDER — SAXAGLIPTIN HCL 5 MG PO TABS: 5.0000 mg | ORAL_TABLET | Freq: Every day | ORAL | 11 refills | Status: DC

## 2017-02-02 ENCOUNTER — Other Ambulatory Visit: Payer: Self-pay | Admitting: Endocrinology

## 2017-02-02 NOTE — Telephone Encounter (Signed)
Please refill x 3 mos Ov is due 

## 2017-02-04 DIAGNOSIS — H2512 Age-related nuclear cataract, left eye: Secondary | ICD-10-CM | POA: Diagnosis not present

## 2017-02-06 DIAGNOSIS — I10 Essential (primary) hypertension: Secondary | ICD-10-CM | POA: Diagnosis not present

## 2017-02-06 DIAGNOSIS — L97421 Non-pressure chronic ulcer of left heel and midfoot limited to breakdown of skin: Secondary | ICD-10-CM | POA: Diagnosis not present

## 2017-02-06 DIAGNOSIS — E11621 Type 2 diabetes mellitus with foot ulcer: Secondary | ICD-10-CM | POA: Diagnosis not present

## 2017-02-06 DIAGNOSIS — E1143 Type 2 diabetes mellitus with diabetic autonomic (poly)neuropathy: Secondary | ICD-10-CM | POA: Diagnosis not present

## 2017-02-15 DIAGNOSIS — H3563 Retinal hemorrhage, bilateral: Secondary | ICD-10-CM | POA: Diagnosis not present

## 2017-02-15 DIAGNOSIS — H4311 Vitreous hemorrhage, right eye: Secondary | ICD-10-CM | POA: Diagnosis not present

## 2017-02-15 DIAGNOSIS — H2513 Age-related nuclear cataract, bilateral: Secondary | ICD-10-CM | POA: Diagnosis not present

## 2017-02-15 DIAGNOSIS — E113591 Type 2 diabetes mellitus with proliferative diabetic retinopathy without macular edema, right eye: Secondary | ICD-10-CM | POA: Diagnosis not present

## 2017-02-18 DIAGNOSIS — E113591 Type 2 diabetes mellitus with proliferative diabetic retinopathy without macular edema, right eye: Secondary | ICD-10-CM | POA: Diagnosis not present

## 2017-02-20 DIAGNOSIS — E11621 Type 2 diabetes mellitus with foot ulcer: Secondary | ICD-10-CM | POA: Diagnosis not present

## 2017-02-20 DIAGNOSIS — E11610 Type 2 diabetes mellitus with diabetic neuropathic arthropathy: Secondary | ICD-10-CM | POA: Diagnosis not present

## 2017-02-20 DIAGNOSIS — E1143 Type 2 diabetes mellitus with diabetic autonomic (poly)neuropathy: Secondary | ICD-10-CM | POA: Diagnosis not present

## 2017-02-20 DIAGNOSIS — L97421 Non-pressure chronic ulcer of left heel and midfoot limited to breakdown of skin: Secondary | ICD-10-CM | POA: Diagnosis not present

## 2017-02-20 DIAGNOSIS — I10 Essential (primary) hypertension: Secondary | ICD-10-CM | POA: Diagnosis not present

## 2017-02-24 HISTORY — PX: CATARACT EXTRACTION: SUR2

## 2017-02-28 ENCOUNTER — Other Ambulatory Visit: Payer: Self-pay | Admitting: Family Medicine

## 2017-02-28 DIAGNOSIS — I10 Essential (primary) hypertension: Secondary | ICD-10-CM

## 2017-03-01 NOTE — Telephone Encounter (Signed)
mychart message sent to pt about making an appt °

## 2017-03-06 ENCOUNTER — Encounter: Payer: Self-pay | Admitting: Endocrinology

## 2017-03-06 ENCOUNTER — Ambulatory Visit (INDEPENDENT_AMBULATORY_CARE_PROVIDER_SITE_OTHER): Payer: BLUE CROSS/BLUE SHIELD | Admitting: Endocrinology

## 2017-03-06 VITALS — BP 126/72 | HR 77 | Ht 68.0 in | Wt 243.0 lb

## 2017-03-06 DIAGNOSIS — I10 Essential (primary) hypertension: Secondary | ICD-10-CM | POA: Diagnosis not present

## 2017-03-06 DIAGNOSIS — E1142 Type 2 diabetes mellitus with diabetic polyneuropathy: Secondary | ICD-10-CM | POA: Diagnosis not present

## 2017-03-06 DIAGNOSIS — L97421 Non-pressure chronic ulcer of left heel and midfoot limited to breakdown of skin: Secondary | ICD-10-CM | POA: Diagnosis not present

## 2017-03-06 DIAGNOSIS — Z794 Long term (current) use of insulin: Secondary | ICD-10-CM

## 2017-03-06 DIAGNOSIS — E11610 Type 2 diabetes mellitus with diabetic neuropathic arthropathy: Secondary | ICD-10-CM | POA: Diagnosis not present

## 2017-03-06 DIAGNOSIS — E11621 Type 2 diabetes mellitus with foot ulcer: Secondary | ICD-10-CM | POA: Diagnosis not present

## 2017-03-06 LAB — MICROALBUMIN / CREATININE URINE RATIO
Creatinine,U: 53.8 mg/dL
MICROALB UR: 2.1 mg/dL — AB (ref 0.0–1.9)
MICROALB/CREAT RATIO: 3.9 mg/g (ref 0.0–30.0)

## 2017-03-06 LAB — POCT GLYCOSYLATED HEMOGLOBIN (HGB A1C): HEMOGLOBIN A1C: 9.2

## 2017-03-06 MED ORDER — INSULIN NPH (HUMAN) (ISOPHANE) 100 UNIT/ML ~~LOC~~ SUSP: 55.0000 [IU] | SUBCUTANEOUS | 11 refills | Status: DC
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
## 2017-03-06 NOTE — Patient Instructions (Addendum)
please increase the NPH insulin to 55 units daily, and take it in the morning.   On this type of insulin schedule, you should eat meals on a regular schedule.  If a meal is missed or significantly delayed, your blood sugar could go low. check your blood sugar twice a day.  vary the time of day when you check, between before the 3 meals, and at bedtime.  also check if you have symptoms of your blood sugar being too high or too low.  please keep a record of the readings and bring it to your next appointment here (or you can bring the meter itself).  You can write it on any piece of paper.  please call us sooner if your blood sugar goes below 70, or if you have a lot of readings over 200.   Please come back for a follow-up appointment in 2 months.

## 2017-03-12 DIAGNOSIS — H2513 Age-related nuclear cataract, bilateral: Secondary | ICD-10-CM | POA: Diagnosis not present

## 2017-03-12 DIAGNOSIS — H4311 Vitreous hemorrhage, right eye: Secondary | ICD-10-CM | POA: Diagnosis not present

## 2017-03-12 DIAGNOSIS — E113552 Type 2 diabetes mellitus with stable proliferative diabetic retinopathy, left eye: Secondary | ICD-10-CM | POA: Diagnosis not present

## 2017-03-12 DIAGNOSIS — E113591 Type 2 diabetes mellitus with proliferative diabetic retinopathy without macular edema, right eye: Secondary | ICD-10-CM | POA: Diagnosis not present

## 2017-03-13 DIAGNOSIS — H2512 Age-related nuclear cataract, left eye: Secondary | ICD-10-CM | POA: Diagnosis not present

## 2017-03-13 DIAGNOSIS — H25812 Combined forms of age-related cataract, left eye: Secondary | ICD-10-CM | POA: Diagnosis not present

## 2017-03-20 DIAGNOSIS — E11621 Type 2 diabetes mellitus with foot ulcer: Secondary | ICD-10-CM | POA: Diagnosis not present

## 2017-03-20 DIAGNOSIS — L97421 Non-pressure chronic ulcer of left heel and midfoot limited to breakdown of skin: Secondary | ICD-10-CM | POA: Diagnosis not present

## 2017-03-20 DIAGNOSIS — I10 Essential (primary) hypertension: Secondary | ICD-10-CM | POA: Diagnosis not present

## 2017-03-27 DIAGNOSIS — Z4800 Encounter for change or removal of nonsurgical wound dressing: Secondary | ICD-10-CM | POA: Diagnosis not present

## 2017-04-03 DIAGNOSIS — E11621 Type 2 diabetes mellitus with foot ulcer: Secondary | ICD-10-CM | POA: Diagnosis not present

## 2017-04-03 DIAGNOSIS — E11610 Type 2 diabetes mellitus with diabetic neuropathic arthropathy: Secondary | ICD-10-CM | POA: Diagnosis not present

## 2017-04-03 DIAGNOSIS — E1143 Type 2 diabetes mellitus with diabetic autonomic (poly)neuropathy: Secondary | ICD-10-CM | POA: Diagnosis not present

## 2017-04-03 DIAGNOSIS — I10 Essential (primary) hypertension: Secondary | ICD-10-CM | POA: Diagnosis not present

## 2017-04-03 DIAGNOSIS — L97529 Non-pressure chronic ulcer of other part of left foot with unspecified severity: Secondary | ICD-10-CM | POA: Diagnosis not present

## 2017-04-05 ENCOUNTER — Other Ambulatory Visit: Payer: Self-pay | Admitting: Family Medicine

## 2017-04-05 DIAGNOSIS — E11610 Type 2 diabetes mellitus with diabetic neuropathic arthropathy: Secondary | ICD-10-CM | POA: Diagnosis not present

## 2017-04-05 DIAGNOSIS — E1143 Type 2 diabetes mellitus with diabetic autonomic (poly)neuropathy: Secondary | ICD-10-CM | POA: Diagnosis not present

## 2017-04-05 DIAGNOSIS — I10 Essential (primary) hypertension: Secondary | ICD-10-CM | POA: Diagnosis not present

## 2017-04-05 DIAGNOSIS — E11621 Type 2 diabetes mellitus with foot ulcer: Secondary | ICD-10-CM | POA: Diagnosis not present

## 2017-04-05 DIAGNOSIS — L97502 Non-pressure chronic ulcer of other part of unspecified foot with fat layer exposed: Secondary | ICD-10-CM | POA: Diagnosis not present

## 2017-04-09 ENCOUNTER — Encounter: Payer: Self-pay | Admitting: Endocrinology

## 2017-04-09 ENCOUNTER — Other Ambulatory Visit: Payer: Self-pay

## 2017-04-09 MED ORDER — SAXAGLIPTIN HCL 5 MG PO TABS
5.0000 mg | ORAL_TABLET | Freq: Every day | ORAL | 5 refills | Status: DC
Start: 2017-04-09 — End: 2017-07-29

## 2017-04-11 DIAGNOSIS — I10 Essential (primary) hypertension: Secondary | ICD-10-CM | POA: Diagnosis not present

## 2017-04-11 DIAGNOSIS — E11610 Type 2 diabetes mellitus with diabetic neuropathic arthropathy: Secondary | ICD-10-CM | POA: Diagnosis not present

## 2017-04-11 DIAGNOSIS — E1143 Type 2 diabetes mellitus with diabetic autonomic (poly)neuropathy: Secondary | ICD-10-CM | POA: Diagnosis not present

## 2017-04-11 DIAGNOSIS — L97502 Non-pressure chronic ulcer of other part of unspecified foot with fat layer exposed: Secondary | ICD-10-CM | POA: Diagnosis not present

## 2017-04-11 DIAGNOSIS — E11621 Type 2 diabetes mellitus with foot ulcer: Secondary | ICD-10-CM | POA: Diagnosis not present

## 2017-04-24 DIAGNOSIS — L97512 Non-pressure chronic ulcer of other part of right foot with fat layer exposed: Secondary | ICD-10-CM | POA: Diagnosis not present

## 2017-04-24 DIAGNOSIS — E11621 Type 2 diabetes mellitus with foot ulcer: Secondary | ICD-10-CM | POA: Insufficient documentation

## 2017-04-24 DIAGNOSIS — E1140 Type 2 diabetes mellitus with diabetic neuropathy, unspecified: Secondary | ICD-10-CM | POA: Diagnosis not present

## 2017-04-24 DIAGNOSIS — E11610 Type 2 diabetes mellitus with diabetic neuropathic arthropathy: Secondary | ICD-10-CM | POA: Diagnosis not present

## 2017-04-24 DIAGNOSIS — I10 Essential (primary) hypertension: Secondary | ICD-10-CM | POA: Diagnosis not present

## 2017-04-24 DIAGNOSIS — Z794 Long term (current) use of insulin: Secondary | ICD-10-CM | POA: Diagnosis not present

## 2017-04-26 ENCOUNTER — Other Ambulatory Visit: Payer: Self-pay | Admitting: Family Medicine

## 2017-04-26 DIAGNOSIS — I10 Essential (primary) hypertension: Secondary | ICD-10-CM

## 2017-04-30 ENCOUNTER — Other Ambulatory Visit: Payer: Self-pay | Admitting: Obstetrics and Gynecology

## 2017-04-30 ENCOUNTER — Other Ambulatory Visit (HOSPITAL_COMMUNITY)
Admission: RE | Admit: 2017-04-30 | Discharge: 2017-04-30 | Disposition: A | Discharge status: Home / Self Care | Payer: BLUE CROSS/BLUE SHIELD | Source: Ambulatory Visit | Attending: Obstetrics and Gynecology | Admitting: Obstetrics and Gynecology

## 2017-04-30 DIAGNOSIS — Z01411 Encounter for gynecological examination (general) (routine) with abnormal findings: Secondary | ICD-10-CM | POA: Diagnosis not present

## 2017-04-30 DIAGNOSIS — Z124 Encounter for screening for malignant neoplasm of cervix: Secondary | ICD-10-CM | POA: Insufficient documentation

## 2017-04-30 DIAGNOSIS — Z1231 Encounter for screening mammogram for malignant neoplasm of breast: Secondary | ICD-10-CM

## 2017-04-30 NOTE — Telephone Encounter (Signed)
mychart message sent to pt about making an apt for more refills °

## 2017-05-03 LAB — CYTOLOGY - PAP
Diagnosis: NEGATIVE
HPV: NOT DETECTED

## 2017-05-06 ENCOUNTER — Ambulatory Visit: Payer: BLUE CROSS/BLUE SHIELD

## 2017-05-07 DIAGNOSIS — E11621 Type 2 diabetes mellitus with foot ulcer: Secondary | ICD-10-CM | POA: Diagnosis not present

## 2017-05-07 DIAGNOSIS — E11610 Type 2 diabetes mellitus with diabetic neuropathic arthropathy: Secondary | ICD-10-CM | POA: Diagnosis not present

## 2017-05-07 DIAGNOSIS — I10 Essential (primary) hypertension: Secondary | ICD-10-CM | POA: Diagnosis not present

## 2017-05-07 DIAGNOSIS — Z794 Long term (current) use of insulin: Secondary | ICD-10-CM | POA: Diagnosis not present

## 2017-05-07 DIAGNOSIS — E1140 Type 2 diabetes mellitus with diabetic neuropathy, unspecified: Secondary | ICD-10-CM | POA: Diagnosis not present

## 2017-05-14 ENCOUNTER — Ambulatory Visit
Admission: RE | Admit: 2017-05-14 | Discharge: 2017-05-14 | Disposition: A | Discharge status: Home / Self Care | Payer: BLUE CROSS/BLUE SHIELD | Source: Ambulatory Visit | Attending: Obstetrics and Gynecology | Admitting: Obstetrics and Gynecology

## 2017-05-14 DIAGNOSIS — Z1231 Encounter for screening mammogram for malignant neoplasm of breast: Secondary | ICD-10-CM

## 2017-05-27 DIAGNOSIS — E113591 Type 2 diabetes mellitus with proliferative diabetic retinopathy without macular edema, right eye: Secondary | ICD-10-CM | POA: Diagnosis not present

## 2017-05-27 DIAGNOSIS — E113512 Type 2 diabetes mellitus with proliferative diabetic retinopathy with macular edema, left eye: Secondary | ICD-10-CM | POA: Diagnosis not present

## 2017-05-27 DIAGNOSIS — H3563 Retinal hemorrhage, bilateral: Secondary | ICD-10-CM | POA: Diagnosis not present

## 2017-05-27 DIAGNOSIS — H4311 Vitreous hemorrhage, right eye: Secondary | ICD-10-CM | POA: Diagnosis not present

## 2017-05-29 ENCOUNTER — Encounter: Payer: Self-pay | Admitting: Endocrinology

## 2017-05-29 ENCOUNTER — Ambulatory Visit (INDEPENDENT_AMBULATORY_CARE_PROVIDER_SITE_OTHER): Payer: BLUE CROSS/BLUE SHIELD | Admitting: Endocrinology

## 2017-05-29 ENCOUNTER — Other Ambulatory Visit: Payer: Self-pay

## 2017-05-29 VITALS — BP 122/64 | HR 78 | Wt 241.2 lb

## 2017-05-29 DIAGNOSIS — Z794 Long term (current) use of insulin: Secondary | ICD-10-CM

## 2017-05-29 DIAGNOSIS — E1142 Type 2 diabetes mellitus with diabetic polyneuropathy: Secondary | ICD-10-CM

## 2017-05-29 LAB — POCT GLYCOSYLATED HEMOGLOBIN (HGB A1C): Hemoglobin A1C: 8.9

## 2017-05-29 MED ORDER — "INSULIN SYRINGE-NEEDLE U-100 31G X 5/16"" 0.5 ML MISC": 5 refills | Status: DC

## 2017-05-29 NOTE — Patient Instructions (Addendum)
please take the NPH insulin when you wake up in the morning.   Please call or message [place] next week, to tell us how the blood sugar is doing.   On this type of insulin schedule, you should eat meals on a regular schedule.  If a meal is missed or significantly delayed, your blood sugar could go low.  check your blood sugar twice a day.  vary the time of day when you check, between before the 3 meals, and at bedtime.  also check if you have symptoms of your blood sugar being too high or too low.  please keep a record of the readings and bring it to your next appointment here (or you can bring the meter itself).  You can write it on any piece of paper.  please call us sooner if your blood sugar goes below 70, or if you have a lot of readings over 200.   Please come back for a follow-up appointment in 2 months.
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
## 2017-05-30 DIAGNOSIS — E113512 Type 2 diabetes mellitus with proliferative diabetic retinopathy with macular edema, left eye: Secondary | ICD-10-CM | POA: Diagnosis not present

## 2017-06-03 DIAGNOSIS — E113591 Type 2 diabetes mellitus with proliferative diabetic retinopathy without macular edema, right eye: Secondary | ICD-10-CM | POA: Diagnosis not present

## 2017-06-10 DIAGNOSIS — E11621 Type 2 diabetes mellitus with foot ulcer: Secondary | ICD-10-CM | POA: Diagnosis not present

## 2017-06-10 DIAGNOSIS — E11610 Type 2 diabetes mellitus with diabetic neuropathic arthropathy: Secondary | ICD-10-CM | POA: Diagnosis not present

## 2017-06-10 DIAGNOSIS — I10 Essential (primary) hypertension: Secondary | ICD-10-CM | POA: Diagnosis not present

## 2017-06-10 DIAGNOSIS — E1165 Type 2 diabetes mellitus with hyperglycemia: Secondary | ICD-10-CM | POA: Diagnosis not present

## 2017-06-10 DIAGNOSIS — E1149 Type 2 diabetes mellitus with other diabetic neurological complication: Secondary | ICD-10-CM | POA: Diagnosis not present

## 2017-06-10 DIAGNOSIS — M79672 Pain in left foot: Secondary | ICD-10-CM | POA: Diagnosis not present

## 2017-06-10 DIAGNOSIS — L97422 Non-pressure chronic ulcer of left heel and midfoot with fat layer exposed: Secondary | ICD-10-CM | POA: Diagnosis not present

## 2017-06-14 DIAGNOSIS — L02612 Cutaneous abscess of left foot: Secondary | ICD-10-CM | POA: Diagnosis not present

## 2017-06-14 DIAGNOSIS — L97422 Non-pressure chronic ulcer of left heel and midfoot with fat layer exposed: Secondary | ICD-10-CM | POA: Diagnosis not present

## 2017-06-14 DIAGNOSIS — L97512 Non-pressure chronic ulcer of other part of right foot with fat layer exposed: Secondary | ICD-10-CM | POA: Diagnosis not present

## 2017-06-14 DIAGNOSIS — M14672 Charcot's joint, left ankle and foot: Secondary | ICD-10-CM | POA: Diagnosis not present

## 2017-06-14 DIAGNOSIS — E11621 Type 2 diabetes mellitus with foot ulcer: Secondary | ICD-10-CM | POA: Diagnosis not present

## 2017-06-14 DIAGNOSIS — M7989 Other specified soft tissue disorders: Secondary | ICD-10-CM | POA: Diagnosis not present

## 2017-06-14 DIAGNOSIS — E11610 Type 2 diabetes mellitus with diabetic neuropathic arthropathy: Secondary | ICD-10-CM | POA: Diagnosis not present

## 2017-06-24 ENCOUNTER — Ambulatory Visit (INDEPENDENT_AMBULATORY_CARE_PROVIDER_SITE_OTHER): Payer: BLUE CROSS/BLUE SHIELD | Admitting: Family Medicine

## 2017-06-24 ENCOUNTER — Encounter: Payer: Self-pay | Admitting: Family Medicine

## 2017-06-24 VITALS — BP 112/58 | HR 90 | Temp 99.0°F | Resp 16 | Ht 68.0 in | Wt 242.2 lb

## 2017-06-24 DIAGNOSIS — E1142 Type 2 diabetes mellitus with diabetic polyneuropathy: Secondary | ICD-10-CM | POA: Diagnosis not present

## 2017-06-24 DIAGNOSIS — Z23 Encounter for immunization: Secondary | ICD-10-CM | POA: Diagnosis not present

## 2017-06-24 DIAGNOSIS — E11610 Type 2 diabetes mellitus with diabetic neuropathic arthropathy: Secondary | ICD-10-CM

## 2017-06-24 DIAGNOSIS — E6609 Other obesity due to excess calories: Secondary | ICD-10-CM

## 2017-06-24 DIAGNOSIS — I10 Essential (primary) hypertension: Secondary | ICD-10-CM | POA: Diagnosis not present

## 2017-06-24 DIAGNOSIS — E66812 Obesity, class 2: Secondary | ICD-10-CM

## 2017-06-24 DIAGNOSIS — Z6836 Body mass index (BMI) 36.0-36.9, adult: Secondary | ICD-10-CM

## 2017-06-24 DIAGNOSIS — Z9884 Bariatric surgery status: Secondary | ICD-10-CM | POA: Diagnosis not present

## 2017-06-24 MED ORDER — LISINOPRIL 2.5 MG PO TABS: 2.5000 mg | ORAL_TABLET | Freq: Every day | ORAL | 3 refills | Status: DC

## 2017-06-24 MED ORDER — HYDROCODONE-ACETAMINOPHEN 5-325 MG PO TABS: 1.0000 | ORAL_TABLET | ORAL | 0 refills | Status: DC | PRN

## 2017-06-24 MED ORDER — AMITRIPTYLINE HCL 50 MG PO TABS: 50.0000 mg | ORAL_TABLET | Freq: Every day | ORAL | 1 refills | Status: DC

## 2017-06-24 MED ORDER — BUPROPION HCL 100 MG PO TABS: 300.0000 mg | ORAL_TABLET | ORAL | 1 refills | Status: DC

## 2017-06-24 MED ORDER — GABAPENTIN 300 MG PO CAPS: 300.0000 mg | ORAL_CAPSULE | Freq: Three times a day (TID) | ORAL | 1 refills | Status: DC
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
## 2017-06-24 NOTE — Patient Instructions (Addendum)
Increasing the bupropion to 2 in the morning for 1-2 wks, then can increase to 3 in the morning if needed. Consider adding in cymbalta (duloxetine) to help with mood/anxiety/pain if this change doesn't help.     IF you received an x-ray today, you will receive an invoice from [facility]. Please contact [facility] at [phone] with questions or concerns regarding your invoice.   IF you received labwork today, you will receive an invoice from [place]. Please contact [facility] at [phone] with questions or concerns regarding your invoice.   Our billing staff will not be able to assist you with questions regarding bills from these companies.  You will be contacted with the lab results as soon as they are available. The fastest way to get your results is to activate your My Chart account. Instructions are located on the last page of this paperwork. If you have not heard from [place] regarding the results in 2 weeks, please contact this office.     Stress and Stress Management Stress is a normal reaction to life events. It is what you feel when life demands more than you are used to or more than you can handle. Some stress can be useful. For example, the stress reaction can help you catch the last bus of the day, study for a test, or meet a deadline at work. But stress that occurs too often or for too long can cause problems. It can affect your emotional health and interfere with relationships and normal daily activities. Too much stress can weaken your immune system and increase your risk for physical illness. If you already have a medical problem, stress can make it worse. What are the causes? All sorts of life events may cause stress. An event that causes stress for one person may not be stressful for another person. Major life events commonly cause stress. These may be positive or negative. Examples include losing your job, moving into a new home, getting married, having a  baby, or losing a loved one. Less obvious life events may also cause stress, especially if they occur day after day or in combination. Examples include working long hours, driving in traffic, caring for children, being in debt, or being in a difficult relationship. What are the signs or symptoms? Stress may cause emotional symptoms including, the following:  Anxiety. This is feeling worried, afraid, on edge, overwhelmed, or out of control.  Anger. This is feeling irritated or impatient.  Depression. This is feeling sad, down, helpless, or guilty.  Difficulty focusing, remembering, or making decisions.  Stress may cause physical symptoms, including the following:  Aches and pains. These may affect your head, neck, back, stomach, or other areas of your body.  Tight muscles or clenched jaw.  Low energy or trouble sleeping.  Stress may cause unhealthy behaviors, including the following:  Eating to feel better (overeating) or skipping meals.  Sleeping too little, too much, or both.  Working too much or putting off tasks (procrastination).  Smoking, drinking alcohol, or using drugs to feel better.  How is this diagnosed? Stress is diagnosed through an assessment by your health care provider. Your health care provider will ask questions about your symptoms and any stressful life events.Your health care provider will also ask about your medical history and may order blood tests or other tests. Certain medical conditions and medicine can cause physical symptoms similar to stress. Mental illness can cause emotional symptoms and unhealthy behaviors similar to stress. Your health care provider may  refer you to a mental health professional for further evaluation. How is this treated? Stress management is the recommended treatment for stress.The goals of stress management are reducing stressful life events and coping with stress in healthy ways. Techniques for reducing stressful life events  include the following:  Stress identification. Self-monitor for stress and identify what causes stress for you. These skills may help you to avoid some stressful events.  Time management. Set your priorities, keep a calendar of events, and learn to say "no." These tools can help you avoid making too many commitments.  Techniques for coping with stress include the following:  Rethinking the problem. Try to think realistically about stressful events rather than ignoring them or overreacting. Try to find the positives in a stressful situation rather than focusing on the negatives.  Exercise. Physical exercise can release both physical and emotional tension. The key is to find a form of exercise you enjoy and do it regularly.  Relaxation techniques. These relax the body and mind. Examples include yoga, meditation, tai chi, biofeedback, deep breathing, progressive muscle relaxation, listening to music, being out in nature, journaling, and other hobbies. Again, the key is to find one or more that you enjoy and can do regularly.  Healthy lifestyle. Eat a balanced diet, get plenty of sleep, and do not smoke. Avoid using alcohol or drugs to relax.  Strong support network. Spend time with family, friends, or other people you enjoy being around.Express your feelings and talk things over with someone you trust.  Counseling or talktherapy with a mental health professional may be helpful if you are having difficulty managing stress on your own. Medicine is typically not recommended for the treatment of stress.Talk to your health care provider if you think you need medicine for symptoms of stress. Follow these instructions at home:  Keep all follow-up visits as directed by your health care provider.  Take all medicines as directed by your health care provider. Contact a health care provider if:  Your symptoms get worse or you start having new symptoms.  You feel overwhelmed by your problems and can no  longer manage them on your own. Get help right away if:  You feel like hurting yourself or someone else. This information is not intended to replace advice given to you by your health care provider. Make sure you discuss any questions you have with your health care provider. Document Released: 02/06/2001 Document Revised: 01/19/2016 Document Reviewed: 04/07/2013 Elsevier Interactive Patient Education  2017 [employer].

## 2017-06-27 DIAGNOSIS — M79672 Pain in left foot: Secondary | ICD-10-CM | POA: Diagnosis not present

## 2017-06-27 DIAGNOSIS — E11610 Type 2 diabetes mellitus with diabetic neuropathic arthropathy: Secondary | ICD-10-CM | POA: Diagnosis not present

## 2017-06-27 DIAGNOSIS — I10 Essential (primary) hypertension: Secondary | ICD-10-CM | POA: Diagnosis not present

## 2017-06-27 DIAGNOSIS — L97422 Non-pressure chronic ulcer of left heel and midfoot with fat layer exposed: Secondary | ICD-10-CM | POA: Diagnosis not present

## 2017-06-27 DIAGNOSIS — E1149 Type 2 diabetes mellitus with other diabetic neurological complication: Secondary | ICD-10-CM | POA: Diagnosis not present

## 2017-06-27 DIAGNOSIS — E1165 Type 2 diabetes mellitus with hyperglycemia: Secondary | ICD-10-CM | POA: Diagnosis not present

## 2017-06-27 DIAGNOSIS — E11621 Type 2 diabetes mellitus with foot ulcer: Secondary | ICD-10-CM | POA: Diagnosis not present

## 2017-06-27 LAB — IRON AND TIBC
IRON: 30 ug/dL (ref 27–159)
Iron Saturation: 8 % — CL (ref 15–55)
TIBC: 354 ug/dL (ref 250–450)
UIBC: 324 ug/dL (ref 131–425)

## 2017-06-27 LAB — CBC WITH DIFFERENTIAL/PLATELET
BASOS ABS: 0 10*3/uL (ref 0.0–0.2)
Basos: 0 %
EOS (ABSOLUTE): 0.2 10*3/uL (ref 0.0–0.4)
Eos: 2 %
HEMOGLOBIN: 11.8 g/dL (ref 11.1–15.9)
Hematocrit: 35.7 % (ref 34.0–46.6)
Immature Grans (Abs): 0 10*3/uL (ref 0.0–0.1)
Immature Granulocytes: 0 %
LYMPHS ABS: 3.4 10*3/uL — AB (ref 0.7–3.1)
LYMPHS: 33 %
MCH: 27.8 pg (ref 26.6–33.0)
MCHC: 33.1 g/dL (ref 31.5–35.7)
MCV: 84 fL (ref 79–97)
MONOCYTES: 4 %
Monocytes Absolute: 0.5 10*3/uL (ref 0.1–0.9)
Neutrophils Absolute: 6.2 10*3/uL (ref 1.4–7.0)
Neutrophils: 61 %
Platelets: 335 10*3/uL (ref 150–379)
RBC: 4.25 x10E6/uL (ref 3.77–5.28)
RDW: 15.3 % (ref 12.3–15.4)
WBC: 10.3 10*3/uL (ref 3.4–10.8)

## 2017-06-27 LAB — VITAMIN B1: Thiamine: 159.9 nmol/L (ref 66.5–200.0)

## 2017-06-27 LAB — COMPREHENSIVE METABOLIC PANEL
A/G RATIO: 1.7 (ref 1.2–2.2)
ALBUMIN: 4.5 g/dL (ref 3.5–5.5)
ALT: 24 IU/L (ref 0–32)
AST: 17 IU/L (ref 0–40)
Alkaline Phosphatase: 129 IU/L — ABNORMAL HIGH (ref 39–117)
BUN / CREAT RATIO: 27 — AB (ref 9–23)
BUN: 24 mg/dL (ref 6–24)
CALCIUM: 9.5 mg/dL (ref 8.7–10.2)
CHLORIDE: 99 mmol/L (ref 96–106)
CO2: 24 mmol/L (ref 20–29)
Creatinine, Ser: 0.88 mg/dL (ref 0.57–1.00)
GFR, EST AFRICAN AMERICAN: 88 mL/min/{1.73_m2} (ref >59)
GFR, EST NON AFRICAN AMERICAN: 76 mL/min/{1.73_m2} (ref >59)
GLOBULIN, TOTAL: 2.6 g/dL (ref 1.5–4.5)
Glucose: 251 mg/dL — ABNORMAL HIGH (ref 65–99)
POTASSIUM: 5.4 mmol/L — AB (ref 3.5–5.2)
SODIUM: 139 mmol/L (ref 134–144)
TOTAL PROTEIN: 7.1 g/dL (ref 6.0–8.5)

## 2017-06-27 LAB — FERRITIN: Ferritin: 46 ng/mL (ref 15–150)

## 2017-06-27 LAB — B12 AND FOLATE PANEL
FOLATE: 14.1 ng/mL (ref >3.0)
Vitamin B-12: 655 pg/mL (ref 232–1245)

## 2017-06-27 LAB — VITAMIN D 25 HYDROXY (VIT D DEFICIENCY, FRACTURES): Vit D, 25-Hydroxy: 25 ng/mL — ABNORMAL LOW (ref 30.0–100.0)

## 2017-07-02 DIAGNOSIS — E113591 Type 2 diabetes mellitus with proliferative diabetic retinopathy without macular edema, right eye: Secondary | ICD-10-CM | POA: Diagnosis not present

## 2017-07-02 DIAGNOSIS — E113512 Type 2 diabetes mellitus with proliferative diabetic retinopathy with macular edema, left eye: Secondary | ICD-10-CM | POA: Diagnosis not present

## 2017-07-03 ENCOUNTER — Encounter: Payer: Self-pay | Admitting: Family Medicine

## 2017-07-05 DIAGNOSIS — G8918 Other acute postprocedural pain: Secondary | ICD-10-CM | POA: Diagnosis not present

## 2017-07-05 DIAGNOSIS — Z888 Allergy status to other drugs, medicaments and biological substances status: Secondary | ICD-10-CM | POA: Diagnosis not present

## 2017-07-05 DIAGNOSIS — L97422 Non-pressure chronic ulcer of left heel and midfoot with fat layer exposed: Secondary | ICD-10-CM | POA: Diagnosis not present

## 2017-07-05 DIAGNOSIS — Z472 Encounter for removal of internal fixation device: Secondary | ICD-10-CM | POA: Diagnosis not present

## 2017-07-05 DIAGNOSIS — M79672 Pain in left foot: Secondary | ICD-10-CM | POA: Diagnosis not present

## 2017-07-05 DIAGNOSIS — Z881 Allergy status to other antibiotic agents status: Secondary | ICD-10-CM | POA: Diagnosis not present

## 2017-07-05 DIAGNOSIS — I10 Essential (primary) hypertension: Secondary | ICD-10-CM | POA: Diagnosis not present

## 2017-07-05 DIAGNOSIS — Z794 Long term (current) use of insulin: Secondary | ICD-10-CM | POA: Diagnosis not present

## 2017-07-05 DIAGNOSIS — Z882 Allergy status to sulfonamides status: Secondary | ICD-10-CM | POA: Diagnosis not present

## 2017-07-05 DIAGNOSIS — Z79899 Other long term (current) drug therapy: Secondary | ICD-10-CM | POA: Diagnosis not present

## 2017-07-05 DIAGNOSIS — E785 Hyperlipidemia, unspecified: Secondary | ICD-10-CM | POA: Diagnosis not present

## 2017-07-05 DIAGNOSIS — L97522 Non-pressure chronic ulcer of other part of left foot with fat layer exposed: Secondary | ICD-10-CM | POA: Diagnosis not present

## 2017-07-05 DIAGNOSIS — L98492 Non-pressure chronic ulcer of skin of other sites with fat layer exposed: Secondary | ICD-10-CM | POA: Diagnosis not present

## 2017-07-05 DIAGNOSIS — E11621 Type 2 diabetes mellitus with foot ulcer: Secondary | ICD-10-CM | POA: Diagnosis not present

## 2017-07-29 ENCOUNTER — Other Ambulatory Visit: Payer: Self-pay

## 2017-07-29 ENCOUNTER — Ambulatory Visit (INDEPENDENT_AMBULATORY_CARE_PROVIDER_SITE_OTHER): Payer: BLUE CROSS/BLUE SHIELD | Admitting: Endocrinology

## 2017-07-29 ENCOUNTER — Encounter: Payer: Self-pay | Admitting: Endocrinology

## 2017-07-29 VITALS — BP 130/62 | HR 98

## 2017-07-29 DIAGNOSIS — Z794 Long term (current) use of insulin: Secondary | ICD-10-CM

## 2017-07-29 DIAGNOSIS — E1142 Type 2 diabetes mellitus with diabetic polyneuropathy: Secondary | ICD-10-CM | POA: Diagnosis not present

## 2017-07-29 LAB — POCT GLYCOSYLATED HEMOGLOBIN (HGB A1C): Hemoglobin A1C: 8.9

## 2017-07-29 MED ORDER — SAXAGLIPTIN HCL 5 MG PO TABS: 5.0000 mg | ORAL_TABLET | Freq: Every day | ORAL | 1 refills | Status: DC

## 2017-07-29 NOTE — Patient Instructions (Addendum)
On this type of insulin schedule, you should eat meals on a regular schedule.  If a meal is missed or significantly delayed, your blood sugar could go low.  Please continue the same insulin for now.  check your blood sugar twice a day.  vary the time of day when you check, between before the 3 meals, and at bedtime.  also check if you have symptoms of your blood sugar being too high or too low.  please keep a record of the readings and bring it to your next appointment here (or you can bring the meter itself).  You can write it on any piece of paper.  please call us sooner if your blood sugar goes below 70, or if you have a lot of readings over 200.   Please come back for a follow-up appointment in 2 months.
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
## 2017-08-06 DIAGNOSIS — E113512 Type 2 diabetes mellitus with proliferative diabetic retinopathy with macular edema, left eye: Secondary | ICD-10-CM | POA: Diagnosis not present

## 2017-08-06 DIAGNOSIS — H3563 Retinal hemorrhage, bilateral: Secondary | ICD-10-CM | POA: Diagnosis not present

## 2017-08-06 DIAGNOSIS — H4311 Vitreous hemorrhage, right eye: Secondary | ICD-10-CM | POA: Diagnosis not present

## 2017-08-06 DIAGNOSIS — E113591 Type 2 diabetes mellitus with proliferative diabetic retinopathy without macular edema, right eye: Secondary | ICD-10-CM | POA: Diagnosis not present

## 2017-08-29 ENCOUNTER — Ambulatory Visit
Admission: RE | Admit: 2017-08-29 | Discharge: 2017-08-29 | Disposition: A | Discharge status: Home / Self Care | Payer: BLUE CROSS/BLUE SHIELD | Source: Ambulatory Visit | Attending: Obstetrics and Gynecology | Admitting: Obstetrics and Gynecology

## 2017-08-29 ENCOUNTER — Other Ambulatory Visit: Payer: Self-pay | Admitting: Obstetrics and Gynecology

## 2017-08-29 DIAGNOSIS — R1031 Right lower quadrant pain: Secondary | ICD-10-CM

## 2017-08-29 MED ORDER — IOPAMIDOL (ISOVUE-300) INJECTION 61%
100.0000 mL | Freq: Once | INTRAVENOUS | Status: AC | PRN
  Administered 2017-08-29: 100 mL via INTRAVENOUS

## 2017-09-30 ENCOUNTER — Ambulatory Visit: Payer: BLUE CROSS/BLUE SHIELD | Admitting: Endocrinology

## 2017-09-30 ENCOUNTER — Encounter: Payer: Self-pay | Admitting: Endocrinology

## 2017-09-30 VITALS — BP 133/76 | HR 83 | Wt 251.3 lb

## 2017-09-30 DIAGNOSIS — Z794 Long term (current) use of insulin: Secondary | ICD-10-CM

## 2017-09-30 DIAGNOSIS — E1142 Type 2 diabetes mellitus with diabetic polyneuropathy: Secondary | ICD-10-CM | POA: Diagnosis not present

## 2017-09-30 LAB — POCT GLYCOSYLATED HEMOGLOBIN (HGB A1C): HEMOGLOBIN A1C: 8.6

## 2017-09-30 MED ORDER — INSULIN NPH ISOPHANE & REGULAR (70-30) 100 UNIT/ML ~~LOC~~ SUSP: 55.0000 [IU] | Freq: Every day | SUBCUTANEOUS | 11 refills | Status: DC
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
## 2017-09-30 NOTE — Patient Instructions (Addendum)
I have sent a prescription to your pharmacy, to change NPH to 70/30, 55 units with breakfast. On this type of insulin schedule, you should eat meals on a regular schedule.  If a meal is missed or significantly delayed, your blood sugar could go low.  check your blood sugar twice a day.  vary the time of day when you check, between before the 3 meals, and at bedtime.  also check if you have symptoms of your blood sugar being too high or too low.  please keep a record of the readings and bring it to your next appointment here (or you can bring the meter itself).  You can write it on any piece of paper.  please call us sooner if your blood sugar goes below 70, or if you have a lot of readings over 200.   Please come back for a follow-up appointment in 2 months.

## 2017-10-08 DIAGNOSIS — E113513 Type 2 diabetes mellitus with proliferative diabetic retinopathy with macular edema, bilateral: Secondary | ICD-10-CM | POA: Diagnosis not present

## 2017-10-08 DIAGNOSIS — Z961 Presence of intraocular lens: Secondary | ICD-10-CM | POA: Diagnosis not present

## 2017-10-10 DIAGNOSIS — E113591 Type 2 diabetes mellitus with proliferative diabetic retinopathy without macular edema, right eye: Secondary | ICD-10-CM | POA: Diagnosis not present

## 2017-10-10 DIAGNOSIS — E113512 Type 2 diabetes mellitus with proliferative diabetic retinopathy with macular edema, left eye: Secondary | ICD-10-CM | POA: Diagnosis not present

## 2017-10-10 DIAGNOSIS — H2511 Age-related nuclear cataract, right eye: Secondary | ICD-10-CM | POA: Diagnosis not present

## 2017-10-10 DIAGNOSIS — H4311 Vitreous hemorrhage, right eye: Secondary | ICD-10-CM | POA: Diagnosis not present

## 2017-10-22 DIAGNOSIS — E11610 Type 2 diabetes mellitus with diabetic neuropathic arthropathy: Secondary | ICD-10-CM | POA: Diagnosis not present

## 2017-10-22 DIAGNOSIS — E1149 Type 2 diabetes mellitus with other diabetic neurological complication: Secondary | ICD-10-CM | POA: Diagnosis not present

## 2017-10-22 DIAGNOSIS — E1165 Type 2 diabetes mellitus with hyperglycemia: Secondary | ICD-10-CM | POA: Diagnosis not present

## 2017-10-22 DIAGNOSIS — Z8631 Personal history of diabetic foot ulcer: Secondary | ICD-10-CM | POA: Diagnosis not present

## 2017-10-22 DIAGNOSIS — I10 Essential (primary) hypertension: Secondary | ICD-10-CM | POA: Diagnosis not present

## 2017-10-23 DIAGNOSIS — E11610 Type 2 diabetes mellitus with diabetic neuropathic arthropathy: Secondary | ICD-10-CM | POA: Diagnosis not present

## 2017-10-23 DIAGNOSIS — E11621 Type 2 diabetes mellitus with foot ulcer: Secondary | ICD-10-CM | POA: Diagnosis not present

## 2017-11-05 ENCOUNTER — Encounter: Payer: Self-pay | Admitting: Family Medicine

## 2017-11-07 ENCOUNTER — Encounter: Payer: Self-pay | Admitting: Family Medicine

## 2017-11-07 ENCOUNTER — Encounter: Payer: Self-pay | Admitting: Gastroenterology

## 2017-11-07 ENCOUNTER — Ambulatory Visit: Payer: BLUE CROSS/BLUE SHIELD | Admitting: Family Medicine

## 2017-11-07 VITALS — BP 120/80 | HR 92 | Temp 99.1°F | Ht 67.5 in | Wt 250.0 lb

## 2017-11-07 DIAGNOSIS — D229 Melanocytic nevi, unspecified: Secondary | ICD-10-CM | POA: Diagnosis not present

## 2017-11-07 DIAGNOSIS — L918 Other hypertrophic disorders of the skin: Secondary | ICD-10-CM

## 2017-11-07 DIAGNOSIS — W3400XA Accidental discharge from unspecified firearms or gun, initial encounter: Secondary | ICD-10-CM

## 2017-11-07 DIAGNOSIS — L820 Inflamed seborrheic keratosis: Secondary | ICD-10-CM | POA: Diagnosis not present

## 2017-11-07 DIAGNOSIS — Z1211 Encounter for screening for malignant neoplasm of colon: Secondary | ICD-10-CM

## 2017-11-07 DIAGNOSIS — Z1212 Encounter for screening for malignant neoplasm of rectum: Secondary | ICD-10-CM

## 2017-11-07 DIAGNOSIS — E11610 Type 2 diabetes mellitus with diabetic neuropathic arthropathy: Secondary | ICD-10-CM | POA: Diagnosis not present

## 2017-11-07 DIAGNOSIS — G894 Chronic pain syndrome: Secondary | ICD-10-CM

## 2017-11-07 MED ORDER — HYDROCODONE-ACETAMINOPHEN 5-325 MG PO TABS: 1.0000 | ORAL_TABLET | ORAL | 0 refills | Status: DC | PRN

## 2017-11-07 MED ORDER — GABAPENTIN 300 MG PO CAPS: 300.0000 mg | ORAL_CAPSULE | Freq: Three times a day (TID) | ORAL | 1 refills | Status: DC

## 2017-11-07 MED ORDER — AMITRIPTYLINE HCL 50 MG PO TABS: 50.0000 mg | ORAL_TABLET | Freq: Every day | ORAL | 1 refills | Status: DC

## 2017-11-07 NOTE — Patient Instructions (Signed)
Skin Tag, Adult A skin tag (acrochordon) is a soft, extra growth of skin. Most skin tags are flesh-colored and rarely bigger than a pencil eraser. They commonly form near areas where there are folds in the skin, such as the armpit or groin. Skin tags are not dangerous, and they do not spread from person to person (are not contagious). You may have one skin tag or several. Skin tags do not require treatment. However, your health care provider may recommend removal of a skin tag if it:  Gets irritated from clothing.  Bleeds.  Is visible and unsightly.  Your health care provider can remove skin tags with a simple surgical procedure or a procedure that involves freezing the skin tag. Follow these instructions at home:  Watch for any changes in your skin tag. A normal skin tag does not require any other special care at home.  Take over-the-counter and prescription medicines only as told by your health care provider.  Keep all follow-up visits as told by your health care provider. This is important. Contact a health care provider if:  You have a skin tag that: ? Becomes painful. ? Changes color. ? Bleeds. ? Swells.  You develop more skin tags. This information is not intended to replace advice given to you by your health care provider. Make sure you discuss any questions you have with your health care provider. Document Released: 08/28/2015 Document Revised: 04/08/2016 Document Reviewed: 08/28/2015 Elsevier Interactive Patient Education  2018 [employer]. Seborrheic Keratosis Seborrheic keratosis is a common, noncancerous (benign) skin growth. This condition causes waxy, rough, tan, brown, or black spots to appear on the skin. These skin growths can be flat or raised. What are the causes? The cause of this condition is not known. What increases the risk? This condition is more likely to develop in:  People who have a family history of seborrheic keratosis.  People who are 86 or  older.  People who are pregnant.  People who have had estrogen replacement therapy.  What are the signs or symptoms? This condition often occurs on the face, chest, shoulders, back, or other areas. These growths:  Are usually painless, but may become irritated and itchy.  Can be yellow, brown, black, or other colors.  Are slightly raised or have a flat surface.  Are sometimes rough or wart-like in texture.  Are often waxy on the surface.  Are round or oval-shaped.  Sometimes look like they are "stuck on."  Often occur in groups, but may occur as a single growth.  How is this diagnosed? This condition is diagnosed with a medical history and physical exam. A sample of the growth may be tested (skin biopsy). You may need to see a skin specialist (dermatologist). How is this treated? Treatment is not usually needed for this condition, unless the growths are irritated or are often bleeding. You may also choose to have the growths removed if you do not like their appearance. Most commonly, these growths are treated with a procedure in which liquid nitrogen is applied to "freeze" off the growth (cryosurgery). They may also be burned off with electricity or cut off. Follow these instructions at home:  Watch your growth for any changes.  Keep all follow-up visits as told by your health care provider. This is important.  Do not scratch or pick at the growth or growths. This can cause them to become irritated or infected. Contact a health care provider if:  You suddenly have many new growths.  Your  growth bleeds, itches, or hurts.  Your growth suddenly becomes larger or changes color. This information is not intended to replace advice given to you by your health care provider. Make sure you discuss any questions you have with your health care provider. Document Released: 09/15/2010 Document Revised: 01/19/2016 Document Reviewed: 12/29/2014 Elsevier Interactive Patient Education   2018 [employer].
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
## 2017-11-28 ENCOUNTER — Ambulatory Visit: Payer: BLUE CROSS/BLUE SHIELD | Admitting: Endocrinology

## 2017-11-28 ENCOUNTER — Ambulatory Visit (INDEPENDENT_AMBULATORY_CARE_PROVIDER_SITE_OTHER): Payer: BLUE CROSS/BLUE SHIELD | Admitting: Gastroenterology

## 2017-11-28 ENCOUNTER — Encounter: Payer: Self-pay | Admitting: Gastroenterology

## 2017-11-28 VITALS — BP 120/60 | HR 92 | Ht 67.25 in | Wt 258.4 lb

## 2017-11-28 DIAGNOSIS — E1142 Type 2 diabetes mellitus with diabetic polyneuropathy: Secondary | ICD-10-CM | POA: Diagnosis not present

## 2017-11-28 DIAGNOSIS — Z9884 Bariatric surgery status: Secondary | ICD-10-CM

## 2017-11-28 DIAGNOSIS — Z794 Long term (current) use of insulin: Secondary | ICD-10-CM | POA: Diagnosis not present

## 2017-11-28 DIAGNOSIS — Z1211 Encounter for screening for malignant neoplasm of colon: Secondary | ICD-10-CM
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
## 2017-11-28 NOTE — Patient Instructions (Signed)
If you are age 52 or older, your body mass index should be between 23-30. Your Body mass index is 40.17 kg/m. If this is out of the aforementioned range listed, please consider follow up with your Primary Care Provider.  If you are age 47 or younger, your body mass index should be between 19-25. Your Body mass index is 40.17 kg/m. If this is out of the aformentioned range listed, please consider follow up with your Primary Care Provider.   We will call you in May 2019 to schedule your Colonoscopy for June 2019.  Thank you for choosing [facility]  [name] III

## 2017-12-06 ENCOUNTER — Encounter (HOSPITAL_COMMUNITY): Payer: Self-pay

## 2017-12-30 DIAGNOSIS — E11621 Type 2 diabetes mellitus with foot ulcer: Secondary | ICD-10-CM | POA: Diagnosis not present

## 2017-12-30 DIAGNOSIS — L97402 Non-pressure chronic ulcer of unspecified heel and midfoot with fat layer exposed: Secondary | ICD-10-CM | POA: Diagnosis not present

## 2017-12-30 DIAGNOSIS — Z8631 Personal history of diabetic foot ulcer: Secondary | ICD-10-CM | POA: Diagnosis not present

## 2017-12-30 DIAGNOSIS — E11610 Type 2 diabetes mellitus with diabetic neuropathic arthropathy: Secondary | ICD-10-CM | POA: Diagnosis not present

## 2018-01-03 ENCOUNTER — Telehealth: Payer: Self-pay

## 2018-01-03 DIAGNOSIS — E11621 Type 2 diabetes mellitus with foot ulcer: Secondary | ICD-10-CM | POA: Diagnosis not present

## 2018-01-03 DIAGNOSIS — L97422 Non-pressure chronic ulcer of left heel and midfoot with fat layer exposed: Secondary | ICD-10-CM | POA: Diagnosis not present

## 2018-01-03 DIAGNOSIS — L97429 Non-pressure chronic ulcer of left heel and midfoot with unspecified severity: Secondary | ICD-10-CM | POA: Diagnosis not present

## 2018-01-03 DIAGNOSIS — L97529 Non-pressure chronic ulcer of other part of left foot with unspecified severity: Secondary | ICD-10-CM | POA: Diagnosis not present

## 2018-01-03 DIAGNOSIS — E11610 Type 2 diabetes mellitus with diabetic neuropathic arthropathy: Secondary | ICD-10-CM | POA: Diagnosis not present

## 2018-01-03 NOTE — Telephone Encounter (Signed)
Pt verbalized understanding of instructions and has appt for 01/06/18 @ 2:45

## 2018-01-03 NOTE — Telephone Encounter (Signed)
Pt called and stated that she received a steroid shot and b/s is 502 please advise

## 2018-01-03 NOTE — Telephone Encounter (Signed)
Please verify no n/v/sob Then take an extra 20 units now Drink plenty of fluids Tomorrow am, start 75 units qam Needs f/u OV next week.

## 2018-01-06 ENCOUNTER — Ambulatory Visit: Payer: BLUE CROSS/BLUE SHIELD | Admitting: Endocrinology

## 2018-01-13 ENCOUNTER — Telehealth: Payer: Self-pay | Admitting: Endocrinology

## 2018-01-13 NOTE — Telephone Encounter (Signed)
-----   Message from Brunilda Payor sent at 01/10/2018 11:06 AM EDT ----- Regarding: [phone] Patient stated she got a no show fee,  She said called a day before.

## 2018-01-13 NOTE — Telephone Encounter (Signed)
email has been sent to charge correction for the fee to be removed

## 2018-01-14 ENCOUNTER — Encounter: Payer: Self-pay | Admitting: Endocrinology

## 2018-01-14 ENCOUNTER — Ambulatory Visit (INDEPENDENT_AMBULATORY_CARE_PROVIDER_SITE_OTHER): Payer: BLUE CROSS/BLUE SHIELD | Admitting: Endocrinology

## 2018-01-14 ENCOUNTER — Encounter: Payer: BLUE CROSS/BLUE SHIELD | Attending: Endocrinology | Admitting: Nutrition

## 2018-01-14 VITALS — BP 138/72 | HR 96 | Wt 251.0 lb

## 2018-01-14 DIAGNOSIS — Z794 Long term (current) use of insulin: Secondary | ICD-10-CM | POA: Insufficient documentation

## 2018-01-14 DIAGNOSIS — E119 Type 2 diabetes mellitus without complications: Secondary | ICD-10-CM

## 2018-01-14 DIAGNOSIS — E1165 Type 2 diabetes mellitus with hyperglycemia: Secondary | ICD-10-CM | POA: Insufficient documentation

## 2018-01-14 DIAGNOSIS — Z713 Dietary counseling and surveillance: Secondary | ICD-10-CM | POA: Diagnosis not present

## 2018-01-14 LAB — POCT GLYCOSYLATED HEMOGLOBIN (HGB A1C): HbA1c, POC (prediabetic range): 9.5 % — AB (ref 5.7–6.4)

## 2018-01-14 MED ORDER — SAXAGLIPTIN HCL 5 MG PO TABS: 5.0000 mg | ORAL_TABLET | Freq: Every day | ORAL | 3 refills | Status: DC
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
## 2018-01-14 NOTE — Patient Instructions (Addendum)
Please continue 70/30, 55 units with breakfast.  On this type of insulin schedule, you should eat meals on a regular schedule.  If a meal is missed or significantly delayed, your blood sugar could go low.  Please see Vaughan Basta, to consider a V-GO pump, and continuous glucose monitor check your blood sugar twice a day.  vary the time of day when you check, between before the 3 meals, and at bedtime.  also check if you have symptoms of your blood sugar being too high or too low.  please keep a record of the readings and bring it to your next appointment here (or you can bring the meter itself).  You can write it on any piece of paper.  please call us sooner if your blood sugar goes below 70, or if you have a lot of readings over 200.   Please come back for a follow-up appointment in 2 months.

## 2018-01-15 NOTE — Progress Notes (Signed)
Patient was shown the V-go and we discussed cost, and how it works.  She is interested, but did not to be trained on this, until she hears from her insurance to see how much it will cost her.  Paperwork was filled out and faxed to [employer].  She will call me when she hears from them to see if she wants a free 6 day trial of this.

## 2018-01-15 NOTE — Patient Instructions (Signed)
Call when you hear back from [employer] to schedule a free trial of this, if you want to proceed with this insulin delivery system.

## 2018-01-21 DIAGNOSIS — Z961 Presence of intraocular lens: Secondary | ICD-10-CM | POA: Diagnosis not present

## 2018-01-21 DIAGNOSIS — H4311 Vitreous hemorrhage, right eye: Secondary | ICD-10-CM | POA: Diagnosis not present

## 2018-01-21 DIAGNOSIS — H2511 Age-related nuclear cataract, right eye: Secondary | ICD-10-CM | POA: Diagnosis not present

## 2018-01-21 DIAGNOSIS — E113553 Type 2 diabetes mellitus with stable proliferative diabetic retinopathy, bilateral: Secondary | ICD-10-CM | POA: Diagnosis not present

## 2018-02-03 ENCOUNTER — Telehealth: Payer: Self-pay | Admitting: Endocrinology

## 2018-02-03 NOTE — Telephone Encounter (Signed)
Let the pt know that we did not have a no show charge placed for her in the system

## 2018-02-11 DIAGNOSIS — T8131XA Disruption of external operation (surgical) wound, not elsewhere classified, initial encounter: Secondary | ICD-10-CM | POA: Diagnosis not present

## 2018-02-11 DIAGNOSIS — Z4789 Encounter for other orthopedic aftercare: Secondary | ICD-10-CM | POA: Diagnosis not present

## 2018-02-11 DIAGNOSIS — E11610 Type 2 diabetes mellitus with diabetic neuropathic arthropathy: Secondary | ICD-10-CM | POA: Diagnosis not present

## 2018-03-04 ENCOUNTER — Other Ambulatory Visit: Payer: Self-pay

## 2018-03-04 NOTE — Progress Notes (Signed)
Follow up, needs a colonoscopy. See last office visit with Dr Loletha Carrow. Colon scheduled and pre visit scheduled.

## 2018-03-05 ENCOUNTER — Other Ambulatory Visit: Payer: Self-pay | Admitting: Family Medicine

## 2018-03-11 DIAGNOSIS — E08621 Diabetes mellitus due to underlying condition with foot ulcer: Secondary | ICD-10-CM | POA: Diagnosis not present

## 2018-03-11 DIAGNOSIS — E11610 Type 2 diabetes mellitus with diabetic neuropathic arthropathy: Secondary | ICD-10-CM | POA: Diagnosis not present

## 2018-03-11 DIAGNOSIS — L97402 Non-pressure chronic ulcer of unspecified heel and midfoot with fat layer exposed: Secondary | ICD-10-CM | POA: Diagnosis not present

## 2018-03-20 ENCOUNTER — Telehealth: Payer: Self-pay | Admitting: Endocrinology

## 2018-03-20 ENCOUNTER — Encounter: Payer: Self-pay | Admitting: Endocrinology

## 2018-03-20 ENCOUNTER — Ambulatory Visit (INDEPENDENT_AMBULATORY_CARE_PROVIDER_SITE_OTHER): Payer: BLUE CROSS/BLUE SHIELD | Admitting: Endocrinology

## 2018-03-20 VITALS — BP 112/64 | HR 88

## 2018-03-20 DIAGNOSIS — E1165 Type 2 diabetes mellitus with hyperglycemia: Secondary | ICD-10-CM

## 2018-03-20 DIAGNOSIS — Z794 Long term (current) use of insulin: Secondary | ICD-10-CM

## 2018-03-20 LAB — POCT GLYCOSYLATED HEMOGLOBIN (HGB A1C): HEMOGLOBIN A1C: 8.8 % — AB (ref 4.0–5.6)

## 2018-03-20 MED ORDER — INSULIN REGULAR HUMAN 100 UNIT/ML IJ SOLN: 28.0000 [IU] | Freq: Every day | INTRAMUSCULAR | 11 refills | Status: DC

## 2018-03-20 MED ORDER — FREESTYLE LIBRE 14 DAY READER DEVI: 1.0000 | Freq: Once | 0 refills | Status: DC

## 2018-03-20 MED ORDER — FREESTYLE LIBRE 14 DAY SENSOR MISC: 1.0000 | 3 refills | Status: DC

## 2018-03-20 MED ORDER — INSULIN NPH (HUMAN) (ISOPHANE) 100 UNIT/ML ~~LOC~~ SUSP: 28.0000 [IU] | Freq: Every day | SUBCUTANEOUS | 11 refills | Status: DC

## 2018-03-20 NOTE — Patient Instructions (Addendum)
Please continue 70/30 to 28 units of reg and 28 of NPH: both with breakfast.  On this type of insulin schedule, you should eat meals on a regular schedule.  If a meal is missed or significantly delayed, your blood sugar could go low.  Please see Vaughan Basta, to consider a V-GO pump, and continuous glucose monitor.   check your blood sugar twice a day.  vary the time of day when you check, between before the 3 meals, and at bedtime.  also check if you have symptoms of your blood sugar being too high or too low.  please keep a record of the readings and bring it to your next appointment here (or you can bring the meter itself).  You can write it on any piece of paper.  please call us sooner if your blood sugar goes below 70, or if you have a lot of readings over 200.   Please come back for a follow-up appointment in 2 months.

## 2018-03-20 NOTE — Telephone Encounter (Signed)
Cassandra with V-Go Customer Service ph# [phone] called re: Patient contacted her-Dr. Loanne Drilling prescribed the Novalin 70/30 and the Novalin R. He had previously prescribed the V-Go. Needs to be prescribed as Novalog not the 2 Novalin's. When he prescribed the V-Go did Dr. Loanne Drilling prescribe the Novalog? Please call Cassandra at the above number to discuss asap-patient wants to pick up medication, Cassandra wants to verify before calling pharmacy.
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
## 2018-03-21 NOTE — Telephone Encounter (Signed)
Yesterday, 70/30 was changed to reg and N, separately.  When pt goes on V-GO, we change to just reg in the V-GO.

## 2018-03-21 NOTE — Telephone Encounter (Signed)
Please advise on below  

## 2018-03-24 ENCOUNTER — Other Ambulatory Visit: Payer: Self-pay

## 2018-03-24 MED ORDER — V-GO 30 KIT: 1.0000 | PACK | Freq: Every day | 11 refills | Status: DC

## 2018-03-24 NOTE — Telephone Encounter (Signed)
Ok, I have sent a prescription to your pharmacy 

## 2018-03-24 NOTE — Telephone Encounter (Signed)
Should I send [facility] rx to [facility] or [facility]?

## 2018-03-24 NOTE — Telephone Encounter (Signed)
I called v-go as well as patient. [facility] stated that patient just needed [facility] & with copay card would be free. Then I was given the number to [facility] so patient to call to see if they would be cheaper in the future. When I called patient she definitely wants to try V-go even though it may be $75 monthly. Patient also stated that her insurance prefers novolog if that could be sent in when she starts pump as well. Patient will also have to be set up with Vaughan Basta for training. Can you send prescription for V-go?

## 2018-03-24 NOTE — Telephone Encounter (Signed)
[facility] now [facility]. She is checking with [facility] to see if in the future it's cheaper with them than using the co-pay card for $75 each month.

## 2018-03-26 ENCOUNTER — Encounter: Payer: Self-pay | Admitting: Family Medicine

## 2018-03-26 ENCOUNTER — Encounter

## 2018-03-26 ENCOUNTER — Other Ambulatory Visit: Payer: Self-pay

## 2018-03-26 ENCOUNTER — Ambulatory Visit (INDEPENDENT_AMBULATORY_CARE_PROVIDER_SITE_OTHER): Payer: BLUE CROSS/BLUE SHIELD | Admitting: Family Medicine

## 2018-03-26 VITALS — BP 118/70 | HR 90 | Temp 97.9°F | Resp 16

## 2018-03-26 DIAGNOSIS — L02419 Cutaneous abscess of limb, unspecified: Secondary | ICD-10-CM

## 2018-03-26 DIAGNOSIS — R590 Localized enlarged lymph nodes: Secondary | ICD-10-CM | POA: Diagnosis not present

## 2018-03-26 LAB — POCT CBC
Granulocyte percent: 66.6 %G (ref 37–80)
HEMATOCRIT: 37.5 % — AB (ref 37.7–47.9)
Hemoglobin: 11.5 g/dL — AB (ref 12.2–16.2)
LYMPH, POC: 3.3 (ref 0.6–3.4)
MCH, POC: 26 pg — AB (ref 27–31.2)
MCHC: 30.6 g/dL — AB (ref 31.8–35.4)
MCV: 85.1 fL (ref 80–97)
MID (cbc): 0.5 (ref 0–0.9)
MPV: 7.8 fL (ref 0–99.8)
POC GRANULOCYTE: 7.7 — AB (ref 2–6.9)
POC LYMPH %: 28.8 % (ref 10–50)
POC MID %: 4.6 % (ref 0–12)
Platelet Count, POC: 365 10*3/uL (ref 142–424)
RBC: 4.41 M/uL (ref 4.04–5.48)
RDW, POC: 14.6 %
WBC: 11.6 10*3/uL — AB (ref 4.6–10.2)

## 2018-03-26 NOTE — Patient Instructions (Signed)
Incision and Drainage, Care After Refer to this sheet in the next few weeks. These instructions provide you with information about caring for yourself after your procedure. Your health care provider may also give you more specific instructions. Your treatment has been planned according to current medical practices, but problems sometimes occur. Call your health care provider if you have any problems or questions after your procedure. What can I expect after the procedure? After the procedure, it is common to have:  Pain or discomfort around your incision site.  Drainage from your incision.  Follow these instructions at home:  Take over-the-counter and prescription medicines only as told by your health care provider.  If you were prescribed an antibiotic medicine, take it as told by your health care provider.Do not stop taking the antibiotic even if you start to feel better.  Followinstructions from your health care provider about: ? How to take care of your incision. ? When and how you should change your packing and bandage (dressing). Wash your hands with soap and water before you change your dressing. If soap and water are not available, use hand sanitizer. ? When you should remove your dressing.  Do not take baths, swim, or use a hot tub until your health care provider approves.  Keep all follow-up visits as told by your health care provider. This is important.  Check your incision area every day for signs of infection. Check for: ? More redness, swelling, or pain. ? More fluid or blood. ? Warmth. ? Pus or a bad smell. Contact a health care provider if:  Your cyst or abscess returns.  You have a fever.  You have more redness, swelling, or pain around your incision.  You have more fluid or blood coming from your incision.  Your incision feels warm to the touch.  You have pus or a bad smell coming from your incision. Get help right away if:  You have severe pain or  bleeding.  You cannot eat or drink without vomiting.  You have decreased urine output.  You become short of breath.  You have chest pain.  You cough up blood.  The area where the incision and drainage occurred becomes numb or it tingles. This information is not intended to replace advice given to you by your health care provider. Make sure you discuss any questions you have with your health care provider. Document Released: 11/05/2011 Document Revised: 01/13/2016 Document Reviewed: 06/03/2015 Elsevier Interactive Patient Education  2018 [place].   Incision and Drainage Incision and drainage is a surgical procedure to open and drain a fluid-filled sac. The sac may be filled with pus, mucus, or blood. Examples of fluid-filled sacs that may need surgical drainage include cysts, skin infections (abscesses), and red lumps that develop from a ruptured cyst or a small abscess (boils). You may need this procedure if the affected area is large, painful, infected, or not healing well. Tell a health care provider about:  Any allergies you have.  All medicines you are taking, including vitamins, herbs, eye drops, creams, and over-the-counter medicines.  Any problems you or family members have had with anesthetic medicines.  Any blood disorders you have.  Any surgeries you have had.  Any medical conditions you have.  Whether you are pregnant or may be pregnant. What are the risks? Generally, this is a safe procedure. However, problems may occur, including:  Infection.  Bleeding.  Allergic reactions to medicines.  Scarring.  What happens before the procedure?  You  may need an ultrasound or other imaging tests to see how large or deep the fluid-filled sac is.  You may have blood tests to check for infection.  You may get a tetanus shot.  You may be given antibiotic medicine to help prevent infection.  Follow instructions from your health care provider about eating or  drinking restrictions.  Ask your health care provider about: ? Changing or stopping your regular medicines. This is especially important if you are taking diabetes medicines or blood thinners. ? Taking medicines such as aspirin and ibuprofen. These medicines can thin your blood. Do not take these medicines before your procedure if your health care provider instructs you not to.  Plan to have someone take you home after the procedure.  If you will be going home right after the procedure, plan to have someone stay with you for 24 hours. What happens during the procedure?  To reduce your risk of infection: ? Your health care team will wash or sanitize their hands. ? Your skin will be washed with soap.  You will be given one or more of the following: ? A medicine to help you relax (sedative). ? A medicine to numb the area (local anesthetic). ? A medicine to make you fall asleep (general anesthetic).  An incision will be made in the top of the fluid-filled sac.  The contents of the sac may be squeezed out, or a syringe or tube (catheter)may be used to empty the sac.  The catheter may be left in place for several weeks to drain any fluid. Or, your health care provider may stitch open the edges of the incision to make a long-term opening for drainage (marsupialization).  The inside of the sac may be washed out (irrigated) with a sterile solution and packed with gauze before it is covered with a bandage (dressing). The procedure may vary among health care providers and hospitals. What happens after the procedure?  Your blood pressure, heart rate, breathing rate, and blood oxygen level will be monitored often until the medicines you were given have worn off.  Do not drive for 24 hours if you received a sedative. This information is not intended to replace advice given to you by your health care provider. Make sure you discuss any questions you have with your health care provider. Document  Released: 02/06/2001 Document Revised: 01/19/2016 Document Reviewed: 06/03/2015 Elsevier Interactive Patient Education  [employer].
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
## 2018-03-27 ENCOUNTER — Encounter: Payer: Self-pay | Admitting: Endocrinology

## 2018-03-28 ENCOUNTER — Other Ambulatory Visit: Payer: Self-pay

## 2018-03-28 ENCOUNTER — Encounter: Payer: Self-pay | Admitting: Family Medicine

## 2018-03-28 ENCOUNTER — Ambulatory Visit (INDEPENDENT_AMBULATORY_CARE_PROVIDER_SITE_OTHER): Payer: BLUE CROSS/BLUE SHIELD | Admitting: Family Medicine

## 2018-03-28 VITALS — BP 118/70 | Temp 97.8°F

## 2018-03-28 DIAGNOSIS — Z5189 Encounter for other specified aftercare: Secondary | ICD-10-CM

## 2018-03-28 LAB — WOUND CULTURE: ORGANISM ID, BACTERIA: NONE SEEN
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
## 2018-03-28 NOTE — Patient Instructions (Signed)
     IF you received an x-ray today, you will receive an invoice from [facility]. Please contact [facility] at [phone] with questions or concerns regarding your invoice.   IF you received labwork today, you will receive an invoice from [facility]. Please contact [facility] at [phone] with questions or concerns regarding your invoice.   Our billing staff will not be able to assist you with questions regarding bills from these companies.  You will be contacted with the lab results as soon as they are available. The fastest way to get your results is to activate your My Chart account. Instructions are located on the last page of this paperwork. If you have not heard from us regarding the results in 2 weeks, please contact this office.     

## 2018-03-29 ENCOUNTER — Other Ambulatory Visit: Payer: Self-pay | Admitting: Endocrinology

## 2018-03-29 MED ORDER — INSULIN ASPART 100 UNIT/ML ~~LOC~~ SOLN: SUBCUTANEOUS | 11 refills | Status: DC

## 2018-04-01 ENCOUNTER — Other Ambulatory Visit: Payer: Self-pay | Admitting: Endocrinology

## 2018-04-01 DIAGNOSIS — E1142 Type 2 diabetes mellitus with diabetic polyneuropathy: Secondary | ICD-10-CM

## 2018-04-01 DIAGNOSIS — Z794 Long term (current) use of insulin: Secondary | ICD-10-CM

## 2018-04-02 ENCOUNTER — Ambulatory Visit (INDEPENDENT_AMBULATORY_CARE_PROVIDER_SITE_OTHER): Payer: BLUE CROSS/BLUE SHIELD | Admitting: Family Medicine

## 2018-04-02 ENCOUNTER — Other Ambulatory Visit: Payer: Self-pay

## 2018-04-02 ENCOUNTER — Encounter: Payer: Self-pay | Admitting: Family Medicine

## 2018-04-02 VITALS — BP 120/80 | HR 90 | Temp 97.9°F | Ht 68.0 in | Wt 235.0 lb

## 2018-04-02 DIAGNOSIS — Z9884 Bariatric surgery status: Secondary | ICD-10-CM | POA: Diagnosis not present

## 2018-04-02 DIAGNOSIS — J22 Unspecified acute lower respiratory infection: Secondary | ICD-10-CM | POA: Diagnosis not present

## 2018-04-02 DIAGNOSIS — Z794 Long term (current) use of insulin: Secondary | ICD-10-CM

## 2018-04-02 DIAGNOSIS — E1142 Type 2 diabetes mellitus with diabetic polyneuropathy: Secondary | ICD-10-CM

## 2018-04-02 MED ORDER — HYDROCOD POLST-CPM POLST ER 10-8 MG/5ML PO SUER: 5.0000 mL | Freq: Two times a day (BID) | ORAL | 0 refills | Status: DC | PRN

## 2018-04-02 MED ORDER — AMOXICILLIN-POT CLAVULANATE 600-42.9 MG/5ML PO SUSR: 6.5000 mL | Freq: Two times a day (BID) | ORAL | 0 refills | Status: AC

## 2018-04-02 NOTE — Patient Instructions (Signed)
     IF you received an x-ray today, you will receive an invoice from [facility]. Please contact [facility] at [phone] with questions or concerns regarding your invoice.   IF you received labwork today, you will receive an invoice from [facility]. Please contact [facility] at [phone] with questions or concerns regarding your invoice.   Our billing staff will not be able to assist you with questions regarding bills from these companies.  You will be contacted with the lab results as soon as they are available. The fastest way to get your results is to activate your My Chart account. Instructions are located on the last page of this paperwork. If you have not heard from us regarding the results in 2 weeks, please contact this office.     
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
## 2018-04-06 ENCOUNTER — Encounter: Payer: Self-pay | Admitting: Family Medicine

## 2018-04-07 ENCOUNTER — Encounter: Payer: BLUE CROSS/BLUE SHIELD | Attending: Endocrinology | Admitting: Nutrition

## 2018-04-07 DIAGNOSIS — E119 Type 2 diabetes mellitus without complications: Secondary | ICD-10-CM

## 2018-04-07 DIAGNOSIS — E08621 Diabetes mellitus due to underlying condition with foot ulcer: Secondary | ICD-10-CM | POA: Diagnosis not present

## 2018-04-07 DIAGNOSIS — Z794 Long term (current) use of insulin: Secondary | ICD-10-CM | POA: Diagnosis not present

## 2018-04-07 DIAGNOSIS — E1165 Type 2 diabetes mellitus with hyperglycemia: Secondary | ICD-10-CM | POA: Insufficient documentation

## 2018-04-07 DIAGNOSIS — L97402 Non-pressure chronic ulcer of unspecified heel and midfoot with fat layer exposed: Secondary | ICD-10-CM | POA: Diagnosis not present

## 2018-04-07 DIAGNOSIS — Z713 Dietary counseling and surveillance: Secondary | ICD-10-CM | POA: Insufficient documentation

## 2018-04-07 DIAGNOSIS — E11621 Type 2 diabetes mellitus with foot ulcer: Secondary | ICD-10-CM | POA: Diagnosis not present

## 2018-04-07 DIAGNOSIS — E1149 Type 2 diabetes mellitus with other diabetic neurological complication: Secondary | ICD-10-CM | POA: Diagnosis not present

## 2018-04-07 NOTE — Progress Notes (Signed)
This patient was trained on how to fill, apply and use the V-Go 30.  She was given a V-go 30 starter kit with directions for use, and a help number to call if questions.  Per Dr. Cordelia Pen voice order, she will give 1 Click (2 units) before lunch and supper.  She will see Dr.Ellison in 2 weeks for review of Readings.  She is wearing a Libre sensor and we discussed the difference between a sensor reading and a blood sugar reading.   She filled a pod with Novolog insulin, and applied it to her lower left abdomen with little instruction from me.   Her blood sugar was 300, and she took one button press.  She is having bronchitis, and an open wound on her left foot that is not healing.   She had no final questions.

## 2018-04-07 NOTE — Patient Instructions (Signed)
Fill and apply a new V-Go every 24 hours. Give one click before lunch and supper Call if blood sugars remain high. Stop all N insulin.

## 2018-04-09 ENCOUNTER — Telehealth: Payer: Self-pay | Admitting: *Deleted

## 2018-04-09 ENCOUNTER — Other Ambulatory Visit: Payer: Self-pay | Admitting: Family Medicine

## 2018-04-09 NOTE — Telephone Encounter (Signed)
Donna Kidd 11-30-1965 [id number]  Dear Dr.Ellison   [name] has scheduled the above patient for a colonoscopy on 05-09-2018. Our records show that he/she is on insulin therapy via an insulin pump.  Our colonoscopy prep protocol requires that:  the patient must be on a clear liquid diet the entire day prior to the procedure date as well as the morning of the procedure.  the patient must be NPO for 3 hours prior to the procedure.   the patient must consume a PEG 3350 solution to prepare for the procedure.  Please advise [place] of any adjustments that need to be made to the patient's insulin pump therapy prior to the above procedure date.    Please route or fax back this completed form to me at [phone] .  If you have any question, please call me at [phone].  Thank you for your help with this matter.  Sincerely,  Magdalene River, CMA

## 2018-04-09 NOTE — Telephone Encounter (Signed)
Patient is for a colonoscopy on 05/09/18 with Dr.Danis. Please send insulin pump letter to [place]. Thank you, Robbin pv

## 2018-04-10 MED ORDER — HYDROCOD POLST-CPM POLST ER 10-8 MG/5ML PO SUER: 5.0000 mL | Freq: Two times a day (BID) | ORAL | 0 refills | Status: DC | PRN

## 2018-04-10 NOTE — Telephone Encounter (Signed)
Patient has a follow up scheduled for 05-02-2018 to see Dr Loanne Drilling.

## 2018-04-10 NOTE — Telephone Encounter (Signed)
[place] csr reviewed.  Med refill.

## 2018-04-10 NOTE — Telephone Encounter (Signed)
i'll address at upcoming ov, thanks.

## 2018-04-10 NOTE — Telephone Encounter (Signed)
Noted  

## 2018-04-15 ENCOUNTER — Other Ambulatory Visit: Payer: Self-pay

## 2018-04-15 ENCOUNTER — Ambulatory Visit (AMBULATORY_SURGERY_CENTER): Payer: Self-pay

## 2018-04-15 VITALS — Ht 68.0 in | Wt 235.0 lb

## 2018-04-15 DIAGNOSIS — Z1211 Encounter for screening for malignant neoplasm of colon: Secondary | ICD-10-CM

## 2018-04-15 MED ORDER — NA SULFATE-K SULFATE-MG SULF 17.5-3.13-1.6 GM/177ML PO SOLN: 1.0000 | Freq: Once | ORAL | 0 refills | Status: AC

## 2018-04-15 NOTE — Progress Notes (Signed)
No egg or soy allergy known to patient  No issues with past sedation with any surgeries  or procedures, no intubation problems  No diet pills per patient No home 02 use per patient  No blood thinners per patient  Pt denies issues with constipation  No A fib or A flutter  EMMI video sent to pt's e mail , pt declined    

## 2018-04-24 DIAGNOSIS — E1149 Type 2 diabetes mellitus with other diabetic neurological complication: Secondary | ICD-10-CM | POA: Diagnosis not present

## 2018-04-24 DIAGNOSIS — E08621 Diabetes mellitus due to underlying condition with foot ulcer: Secondary | ICD-10-CM | POA: Diagnosis not present

## 2018-04-24 DIAGNOSIS — E11621 Type 2 diabetes mellitus with foot ulcer: Secondary | ICD-10-CM | POA: Diagnosis not present

## 2018-04-24 DIAGNOSIS — L97402 Non-pressure chronic ulcer of unspecified heel and midfoot with fat layer exposed: Secondary | ICD-10-CM | POA: Diagnosis not present

## 2018-04-24 DIAGNOSIS — E1165 Type 2 diabetes mellitus with hyperglycemia: Secondary | ICD-10-CM | POA: Diagnosis not present

## 2018-04-25 ENCOUNTER — Encounter: Payer: Self-pay | Admitting: Gastroenterology

## 2018-05-02 ENCOUNTER — Telehealth: Payer: Self-pay | Admitting: Gastroenterology

## 2018-05-02 ENCOUNTER — Ambulatory Visit: Payer: BLUE CROSS/BLUE SHIELD | Admitting: Endocrinology

## 2018-05-02 NOTE — Telephone Encounter (Signed)
All questions answered

## 2018-05-07 ENCOUNTER — Telehealth: Payer: Self-pay | Admitting: *Deleted

## 2018-05-07 ENCOUNTER — Ambulatory Visit (INDEPENDENT_AMBULATORY_CARE_PROVIDER_SITE_OTHER): Payer: BLUE CROSS/BLUE SHIELD | Admitting: Endocrinology

## 2018-05-07 VITALS — BP 140/98 | HR 80 | Ht 68.0 in | Wt 235.0 lb

## 2018-05-07 DIAGNOSIS — E1142 Type 2 diabetes mellitus with diabetic polyneuropathy: Secondary | ICD-10-CM | POA: Diagnosis not present

## 2018-05-07 DIAGNOSIS — E11610 Type 2 diabetes mellitus with diabetic neuropathic arthropathy: Secondary | ICD-10-CM | POA: Diagnosis not present

## 2018-05-07 DIAGNOSIS — E1149 Type 2 diabetes mellitus with other diabetic neurological complication: Secondary | ICD-10-CM | POA: Diagnosis not present

## 2018-05-07 DIAGNOSIS — E11621 Type 2 diabetes mellitus with foot ulcer: Secondary | ICD-10-CM | POA: Diagnosis not present

## 2018-05-07 DIAGNOSIS — Z794 Long term (current) use of insulin: Secondary | ICD-10-CM | POA: Diagnosis not present

## 2018-05-07 DIAGNOSIS — E1165 Type 2 diabetes mellitus with hyperglycemia: Secondary | ICD-10-CM | POA: Diagnosis not present

## 2018-05-07 NOTE — Telephone Encounter (Signed)
Pt is on an insulin pump.  She states Dr. Loanne Drilling instructed her how to manage it for her procedure- she is to have it on tomorrow, but then shut it off for the day of her procedure.

## 2018-05-07 NOTE — Patient Instructions (Addendum)
Your blood pressure is high today.  Please see your primary care provider soon, to have it rechecked Please call us in a few days, to tell us how many clicks you are taking per day. Please come back for a follow-up appointment in 6 weeks.
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
## 2018-05-09 ENCOUNTER — Encounter: Payer: Self-pay | Admitting: Gastroenterology

## 2018-05-09 ENCOUNTER — Ambulatory Visit (AMBULATORY_SURGERY_CENTER): Payer: BLUE CROSS/BLUE SHIELD | Admitting: Gastroenterology

## 2018-05-09 VITALS — BP 112/60 | HR 71 | Temp 98.4°F | Resp 18 | Ht 68.0 in | Wt 235.0 lb

## 2018-05-09 DIAGNOSIS — D124 Benign neoplasm of descending colon: Secondary | ICD-10-CM | POA: Diagnosis not present

## 2018-05-09 DIAGNOSIS — D125 Benign neoplasm of sigmoid colon: Secondary | ICD-10-CM | POA: Diagnosis not present

## 2018-05-09 DIAGNOSIS — D122 Benign neoplasm of ascending colon: Secondary | ICD-10-CM | POA: Diagnosis not present

## 2018-05-09 DIAGNOSIS — Z1211 Encounter for screening for malignant neoplasm of colon: Secondary | ICD-10-CM

## 2018-05-09 MED ORDER — SODIUM CHLORIDE 0.9 % IV SOLN
500.0000 mL | Freq: Once | INTRAVENOUS | Status: DC
Start: 2018-05-09 — End: 2020-04-14

## 2018-05-09 NOTE — Progress Notes (Signed)
Report given to PACU, vss 

## 2018-05-09 NOTE — Op Note (Signed)
[facility] Patient Name: Donna Kidd Procedure Date: 05/09/2018 11:32 AM MRN: [id number] Endoscopist: Sherilyn Cooter L. Myrtie Neither , MD Age: 52 Referring MD:  Date of Birth: 07/23/1966 Gender: Female Account #: [id number] Procedure:                Colonoscopy Indications:              Screening for colorectal malignant neoplasm, This                            is the patient's first colonoscopy Medicines:                Monitored Anesthesia Care Procedure:                Pre-Anesthesia Assessment:                           - Prior to the procedure, a History and Physical                            was performed, and patient medications and                            allergies were reviewed. The patient's tolerance of                            previous anesthesia was also reviewed. The risks                            and benefits of the procedure and the sedation                            options and risks were discussed with the patient.                            All questions were answered, and informed consent                            was obtained. Prior Anticoagulants: The patient has                            taken no previous anticoagulant or antiplatelet                            agents. ASA Grade Assessment: III - A patient with                            severe systemic disease. After reviewing the risks                            and benefits, the patient was deemed in                            satisfactory condition to undergo the procedure.  After obtaining informed consent, the colonoscope                            was passed under direct vision. Throughout the                            procedure, the patient's blood pressure, pulse, and                            oxygen saturations were monitored continuously. The                            Colonoscope was introduced through the anus and                            advanced to the the  terminal ileum, with                            identification of the appendiceal orifice and IC                            valve. The colonoscopy was performed without                            difficulty. The patient tolerated the procedure                            well. The quality of the bowel preparation was                            good. The terminal ileum, ileocecal valve,                            appendiceal orifice, and rectum were photographed.                            The quality of the bowel preparation was evaluated                            using the BBPS [facility] Bowel Preparation Scale)                            with scores of: Right Colon = 2, Transverse Colon =                            2 and Left Colon = 2. The total BBPS score equals                            6.,after lavage. The bowel preparation used was                            SUPREP. Scope In: 11:42:53 AM Scope Out: 12:05:41 PM Scope Withdrawal Time: 0 hours 15 minutes 36 seconds  Total Procedure Duration: 0 hours 22 minutes 48 seconds  Findings:                 The perianal and digital rectal examinations were                            normal.                           Five sessile polyps were found in the sigmoid colon                            (1), descending colon (2) and ascending colon (2).                            The polyps were 3 to 8 mm in size. These polyps                            were removed with a cold snare. Resection and                            retrieval were complete.                           The colon (entire examined portion) was redundant.                           The exam was otherwise without abnormality on                            direct and retroflexion views. Complications:            No immediate complications. Estimated Blood Loss:     Estimated blood loss was minimal. Impression:               - Five 3 to 8 mm polyps in the sigmoid colon, in                             the descending colon and in the ascending colon,                            removed with a cold snare. Resected and retrieved.                           - Redundant colon.                           - The examination was otherwise normal on direct                            and retroflexion views. Recommendation:           - Patient has a contact number available for                            emergencies. The signs and  symptoms of potential                            delayed complications were discussed with the                            patient. Return to normal activities tomorrow.                            Written discharge instructions were provided to the                            patient.                           - Resume previous diet.                           - Continue present medications.                           - Await pathology results.                           - Repeat colonoscopy is recommended for                            surveillance. The colonoscopy date will be                            determined after pathology results from today's                            exam become available for review. Chadwin Fury L. Myrtie Neither, MD 05/09/2018 12:10:35 PM This report has been signed electronically.

## 2018-05-09 NOTE — Progress Notes (Signed)
Called to room to assist during endoscopic procedure.  Patient ID and intended procedure confirmed with present staff. Received instructions for my participation in the procedure from the performing physician.  

## 2018-05-09 NOTE — Patient Instructions (Signed)
Discharge instructions given. Handout on polyps. Resume previous medications. YOU HAD AN ENDOSCOPIC PROCEDURE TODAY AT THE [facility]:   Refer to the procedure report that was given to you for any specific questions about what was found during the examination.  If the procedure report does not answer your questions, please call your gastroenterologist to clarify.  If you requested that your care partner not be given the details of your procedure findings, then the procedure report has been included in a sealed envelope for you to review at your convenience later.  YOU SHOULD EXPECT: Some feelings of bloating in the abdomen. Passage of more gas than usual.  Walking can help get rid of the air that was put into your GI tract during the procedure and reduce the bloating. If you had a lower endoscopy (such as a colonoscopy or flexible sigmoidoscopy) you may notice spotting of blood in your stool or on the toilet paper. If you underwent a bowel prep for your procedure, you may not have a normal bowel movement for a few days.  Please Note:  You might notice some irritation and congestion in your nose or some drainage.  This is from the oxygen used during your procedure.  There is no need for concern and it should clear up in a day or so.  SYMPTOMS TO REPORT IMMEDIATELY:   Following lower endoscopy (colonoscopy or flexible sigmoidoscopy):  Excessive amounts of blood in the stool  Significant tenderness or worsening of abdominal pains  Swelling of the abdomen that is new, acute  Fever of 100F or higher   For urgent or emergent issues, a gastroenterologist can be reached at any hour by calling [phone].   DIET:  We do recommend a small meal at first, but then you may proceed to your regular diet.  Drink plenty of fluids but you should avoid alcoholic beverages for 24 hours.  ACTIVITY:  You should plan to take it easy for the rest of today and you should NOT DRIVE or use heavy  machinery until tomorrow (because of the sedation medicines used during the test).    FOLLOW UP: Our staff will call the number listed on your records the next business day following your procedure to check on you and address any questions or concerns that you may have regarding the information given to you following your procedure. If we do not reach you, we will leave a message.  However, if you are feeling well and you are not experiencing any problems, there is no need to return our call.  We will assume that you have returned to your regular daily activities without incident.  If any biopsies were taken you will be contacted by phone or by letter within the next 1-3 weeks.  Please call us at [phone] if you have not heard about the biopsies in 3 weeks.    SIGNATURES/CONFIDENTIALITY: You and/or your care partner have signed paperwork which will be entered into your electronic medical record.  These signatures attest to the fact that that the information above on your After Visit Summary has been reviewed and is understood.  Full responsibility of the confidentiality of this discharge information lies with you and/or your care-partner. 

## 2018-05-12 ENCOUNTER — Telehealth: Payer: Self-pay | Admitting: *Deleted

## 2018-05-12 NOTE — Telephone Encounter (Signed)
  Follow up Call-  Call back number 05/09/2018  Post procedure Call Back phone  # [phone]  Permission to leave phone message Yes  Some recent data might be hidden     Patient questions:  Do you have a fever, pain , or abdominal swelling? No. Pain Score  0 *  Have you tolerated food without any problems? Yes.    Have you been able to return to your normal activities? Yes.    Do you have any questions about your discharge instructions: Diet   No. Medications  No. Follow up visit  No.  Do you have questions or concerns about your Care? No.  Actions: * If pain score is 4 or above: No action needed, pain <4.

## 2018-05-14 ENCOUNTER — Encounter: Payer: Self-pay | Admitting: Gastroenterology

## 2018-05-21 DIAGNOSIS — E1149 Type 2 diabetes mellitus with other diabetic neurological complication: Secondary | ICD-10-CM | POA: Diagnosis not present

## 2018-05-21 DIAGNOSIS — E11610 Type 2 diabetes mellitus with diabetic neuropathic arthropathy: Secondary | ICD-10-CM | POA: Diagnosis not present

## 2018-05-21 DIAGNOSIS — E1165 Type 2 diabetes mellitus with hyperglycemia: Secondary | ICD-10-CM | POA: Diagnosis not present

## 2018-05-21 DIAGNOSIS — L97422 Non-pressure chronic ulcer of left heel and midfoot with fat layer exposed: Secondary | ICD-10-CM | POA: Diagnosis not present

## 2018-05-21 DIAGNOSIS — E11621 Type 2 diabetes mellitus with foot ulcer: Secondary | ICD-10-CM | POA: Diagnosis not present

## 2018-05-27 ENCOUNTER — Encounter: Payer: Self-pay | Admitting: Endocrinology

## 2018-05-27 ENCOUNTER — Ambulatory Visit (INDEPENDENT_AMBULATORY_CARE_PROVIDER_SITE_OTHER): Payer: BLUE CROSS/BLUE SHIELD | Admitting: Endocrinology

## 2018-05-27 VITALS — BP 132/76 | HR 89 | Ht 68.0 in

## 2018-05-27 DIAGNOSIS — Z794 Long term (current) use of insulin: Secondary | ICD-10-CM | POA: Diagnosis not present

## 2018-05-27 DIAGNOSIS — E1142 Type 2 diabetes mellitus with diabetic polyneuropathy: Secondary | ICD-10-CM | POA: Diagnosis not present

## 2018-05-27 LAB — POCT GLYCOSYLATED HEMOGLOBIN (HGB A1C): Hemoglobin A1C: 7.9 % — AB (ref 4.0–5.6)

## 2018-05-27 MED ORDER — V-GO 40 KIT: 1.0000 | PACK | Freq: Every day | 11 refills | Status: DC
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
## 2018-05-27 NOTE — Patient Instructions (Addendum)
Please come back for a follow-up appointment in 6 weeks.   Please increase to the V-GO-40.  I have sent a prescription to your pharmacy.  this should reduce the number of clicks you are needing. Please come back for a follow-up appointment in 2 months.

## 2018-05-28 DIAGNOSIS — E11621 Type 2 diabetes mellitus with foot ulcer: Secondary | ICD-10-CM | POA: Diagnosis not present

## 2018-05-28 DIAGNOSIS — H43391 Other vitreous opacities, right eye: Secondary | ICD-10-CM | POA: Diagnosis not present

## 2018-05-28 DIAGNOSIS — L97422 Non-pressure chronic ulcer of left heel and midfoot with fat layer exposed: Secondary | ICD-10-CM | POA: Diagnosis not present

## 2018-05-28 DIAGNOSIS — Z961 Presence of intraocular lens: Secondary | ICD-10-CM | POA: Diagnosis not present

## 2018-05-28 DIAGNOSIS — E113553 Type 2 diabetes mellitus with stable proliferative diabetic retinopathy, bilateral: Secondary | ICD-10-CM | POA: Diagnosis not present

## 2018-05-28 DIAGNOSIS — H2511 Age-related nuclear cataract, right eye: Secondary | ICD-10-CM | POA: Diagnosis not present

## 2018-06-04 ENCOUNTER — Other Ambulatory Visit: Payer: Self-pay | Admitting: Family Medicine

## 2018-06-04 DIAGNOSIS — I10 Essential (primary) hypertension: Secondary | ICD-10-CM

## 2018-06-04 NOTE — Telephone Encounter (Signed)
Requested Prescriptions  Pending Prescriptions Disp Refills  . amitriptyline (ELAVIL) 50 MG tablet [Pharmacy Med Name: AMITRIPTYLINE HCL 50 MG TAB] 90 tablet 1    Sig: TAKE 1 TABLET BY MOUTH EVERYDAY AT BEDTIME     Psychiatry:  Antidepressants - Heterocyclics (TCAs) Passed - 06/04/2018  9:05 AM      Passed - Valid encounter within last 6 months    Recent Outpatient Visits          2 months ago Acute lower respiratory tract infection   [facility], Lilia Argue, MD   2 months ago Wound check, abscess   [facility], Lilia Argue, MD   2 months ago Lymphadenopathy, axillary   [facility], Laurey Arrow, MD   6 months ago Nevus   [facility], Laurey Arrow, MD   11 months ago Essential hypertension, benign   [facility], Laurey Arrow, MD           . gabapentin (NEURONTIN) 300 MG capsule [Pharmacy Med Name: GABAPENTIN 300 MG CAPSULE] 270 capsule 1    Sig: TAKE 1 [place]     Neurology: Anticonvulsants - gabapentin Passed - 06/04/2018  9:05 AM      Passed - Valid encounter within last 12 months    Recent Outpatient Visits          2 months ago Acute lower respiratory tract infection   [facility], Lilia Argue, MD   2 months ago Wound check, abscess   [facility], Lilia Argue, MD   2 months ago Lymphadenopathy, axillary   [facility], Laurey Arrow, MD   6 months ago Nevus   [facility], Laurey Arrow, MD   11 months ago Essential hypertension, benign   [facility], Laurey Arrow, MD           . lisinopril (PRINIVIL,ZESTRIL) 2.5 MG tablet [Pharmacy Med Name: LISINOPRIL 2.5 MG TABLET] 90 tablet 3    Sig: TAKE 1 TABLET BY MOUTH EVERY DAY     Cardiovascular:  ACE Inhibitors Failed - 06/04/2018  9:05 AM      Failed - Cr in normal range and within 180 days    Creat  Date Value Ref Range Status  06/28/2015 0.53 0.50 - 1.10 mg/dL Final    Creatinine, Ser  Date Value Ref Range Status  06/24/2017 0.88 0.57 - 1.00 mg/dL Final         Failed - K in normal range and within 180 days    Potassium  Date Value Ref Range Status  06/24/2017 5.4 (H) 3.5 - 5.2 mmol/L Final         Passed - Patient is not pregnant      Passed - Last BP in normal range    BP Readings from Last 1 Encounters:  05/27/18 132/76         Passed - Valid encounter within last 6 months    Recent Outpatient Visits          2 months ago Acute lower respiratory tract infection   [facility], Lilia Argue, MD   2 months ago Wound check, abscess   [facility], Lilia Argue, MD   2 months ago Lymphadenopathy, axillary   [facility], Laurey Arrow, MD  6 months ago Nevus   [facility], MD   11 months ago Essential hypertension, benign   [facility], Laurey Arrow, MD

## 2018-06-13 ENCOUNTER — Encounter: Payer: Self-pay | Admitting: Endocrinology

## 2018-06-17 DIAGNOSIS — E11610 Type 2 diabetes mellitus with diabetic neuropathic arthropathy: Secondary | ICD-10-CM | POA: Diagnosis not present

## 2018-07-01 DIAGNOSIS — E11621 Type 2 diabetes mellitus with foot ulcer: Secondary | ICD-10-CM | POA: Diagnosis not present

## 2018-07-01 DIAGNOSIS — L97422 Non-pressure chronic ulcer of left heel and midfoot with fat layer exposed: Secondary | ICD-10-CM | POA: Diagnosis not present

## 2018-07-01 DIAGNOSIS — E11610 Type 2 diabetes mellitus with diabetic neuropathic arthropathy: Secondary | ICD-10-CM | POA: Diagnosis not present

## 2018-07-01 DIAGNOSIS — Z8631 Personal history of diabetic foot ulcer: Secondary | ICD-10-CM | POA: Diagnosis not present

## 2018-07-08 ENCOUNTER — Ambulatory Visit (INDEPENDENT_AMBULATORY_CARE_PROVIDER_SITE_OTHER): Payer: BLUE CROSS/BLUE SHIELD | Admitting: Endocrinology

## 2018-07-08 ENCOUNTER — Encounter: Payer: Self-pay | Admitting: Endocrinology

## 2018-07-08 ENCOUNTER — Other Ambulatory Visit: Payer: Self-pay | Admitting: Obstetrics and Gynecology

## 2018-07-08 VITALS — BP 124/78 | HR 86 | Ht 68.0 in | Wt 259.0 lb

## 2018-07-08 DIAGNOSIS — E1142 Type 2 diabetes mellitus with diabetic polyneuropathy: Secondary | ICD-10-CM

## 2018-07-08 DIAGNOSIS — Z23 Encounter for immunization: Secondary | ICD-10-CM | POA: Diagnosis not present

## 2018-07-08 DIAGNOSIS — Z794 Long term (current) use of insulin: Secondary | ICD-10-CM | POA: Diagnosis not present

## 2018-07-08 DIAGNOSIS — Z1231 Encounter for screening mammogram for malignant neoplasm of breast: Secondary | ICD-10-CM

## 2018-07-08 LAB — POCT GLYCOSYLATED HEMOGLOBIN (HGB A1C): Hemoglobin A1C: 7.6 % — AB (ref 4.0–5.6)

## 2018-07-08 MED ORDER — FREESTYLE LIBRE 14 DAY SENSOR MISC: 1.0000 | 3 refills | Status: DC
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
## 2018-07-08 NOTE — Patient Instructions (Addendum)
Please make sure you take 2 clicks with each meal, including breakfast. Please continue the same continuous glucose monitor, which we are happy to review when you are here.   Please come back for a follow-up appointment in 2 months.

## 2018-07-14 ENCOUNTER — Telehealth: Payer: Self-pay | Admitting: Family Medicine

## 2018-07-14 DIAGNOSIS — H4311 Vitreous hemorrhage, right eye: Secondary | ICD-10-CM | POA: Diagnosis not present

## 2018-07-14 DIAGNOSIS — E113552 Type 2 diabetes mellitus with stable proliferative diabetic retinopathy, left eye: Secondary | ICD-10-CM | POA: Diagnosis not present

## 2018-07-14 DIAGNOSIS — H2511 Age-related nuclear cataract, right eye: Secondary | ICD-10-CM | POA: Diagnosis not present

## 2018-07-14 DIAGNOSIS — E113591 Type 2 diabetes mellitus with proliferative diabetic retinopathy without macular edema, right eye: Secondary | ICD-10-CM | POA: Diagnosis not present

## 2018-07-14 MED ORDER — HYDROCODONE-ACETAMINOPHEN 5-325 MG PO TABS: 1.0000 | ORAL_TABLET | ORAL | 0 refills | Status: DC | PRN

## 2018-07-14 NOTE — Telephone Encounter (Signed)
Saw pt's husband for an OV today - Donna Kidd - he showed me a text pt sent him asking him to ask me for something she could take for pain and including 2 picutures of a large open necrotic appearing wound on the medial aspect of her first MTP joint - is being followed closely by podiatry. Gave husband copy of hard rx for pt.  Meds ordered this encounter  Medications  . HYDROcodone-acetaminophen (NORCO/VICODIN) 5-325 MG tablet    Sig: Take 1 tablet by mouth every 4 (four) hours as needed for moderate pain.    Dispense:  30 tablet    Refill:  0

## 2018-07-15 DIAGNOSIS — E11621 Type 2 diabetes mellitus with foot ulcer: Secondary | ICD-10-CM | POA: Diagnosis not present

## 2018-07-15 DIAGNOSIS — L97422 Non-pressure chronic ulcer of left heel and midfoot with fat layer exposed: Secondary | ICD-10-CM | POA: Diagnosis not present

## 2018-07-15 DIAGNOSIS — E11610 Type 2 diabetes mellitus with diabetic neuropathic arthropathy: Secondary | ICD-10-CM | POA: Diagnosis not present

## 2018-07-23 DIAGNOSIS — G8918 Other acute postprocedural pain: Secondary | ICD-10-CM | POA: Diagnosis not present

## 2018-07-23 DIAGNOSIS — L97421 Non-pressure chronic ulcer of left heel and midfoot limited to breakdown of skin: Secondary | ICD-10-CM | POA: Diagnosis not present

## 2018-07-23 DIAGNOSIS — T368X5A Adverse effect of other systemic antibiotics, initial encounter: Secondary | ICD-10-CM | POA: Diagnosis not present

## 2018-07-23 DIAGNOSIS — L97422 Non-pressure chronic ulcer of left heel and midfoot with fat layer exposed: Secondary | ICD-10-CM | POA: Diagnosis not present

## 2018-07-23 DIAGNOSIS — T363X5A Adverse effect of macrolides, initial encounter: Secondary | ICD-10-CM | POA: Diagnosis not present

## 2018-07-23 DIAGNOSIS — E11610 Type 2 diabetes mellitus with diabetic neuropathic arthropathy: Secondary | ICD-10-CM | POA: Diagnosis not present

## 2018-07-23 DIAGNOSIS — E11621 Type 2 diabetes mellitus with foot ulcer: Secondary | ICD-10-CM | POA: Diagnosis not present

## 2018-07-23 DIAGNOSIS — X58XXXA Exposure to other specified factors, initial encounter: Secondary | ICD-10-CM | POA: Diagnosis not present

## 2018-07-23 DIAGNOSIS — T481X5A Adverse effect of skeletal muscle relaxants [neuromuscular blocking agents], initial encounter: Secondary | ICD-10-CM | POA: Diagnosis not present

## 2018-07-28 DIAGNOSIS — H2511 Age-related nuclear cataract, right eye: Secondary | ICD-10-CM | POA: Diagnosis not present

## 2018-07-28 DIAGNOSIS — H4311 Vitreous hemorrhage, right eye: Secondary | ICD-10-CM | POA: Diagnosis not present

## 2018-07-28 DIAGNOSIS — E113552 Type 2 diabetes mellitus with stable proliferative diabetic retinopathy, left eye: Secondary | ICD-10-CM | POA: Diagnosis not present

## 2018-07-28 DIAGNOSIS — E113591 Type 2 diabetes mellitus with proliferative diabetic retinopathy without macular edema, right eye: Secondary | ICD-10-CM | POA: Diagnosis not present

## 2018-07-30 ENCOUNTER — Ambulatory Visit: Payer: BLUE CROSS/BLUE SHIELD | Admitting: Endocrinology

## 2018-07-30 ENCOUNTER — Ambulatory Visit
Admission: RE | Admit: 2018-07-30 | Discharge: 2018-07-30 | Disposition: A | Discharge status: Home / Self Care | Payer: BLUE CROSS/BLUE SHIELD | Source: Ambulatory Visit | Attending: Obstetrics and Gynecology | Admitting: Obstetrics and Gynecology

## 2018-07-30 DIAGNOSIS — L97509 Non-pressure chronic ulcer of other part of unspecified foot with unspecified severity: Secondary | ICD-10-CM | POA: Diagnosis not present

## 2018-07-30 DIAGNOSIS — E1149 Type 2 diabetes mellitus with other diabetic neurological complication: Secondary | ICD-10-CM | POA: Diagnosis not present

## 2018-07-30 DIAGNOSIS — Z1231 Encounter for screening mammogram for malignant neoplasm of breast: Secondary | ICD-10-CM

## 2018-07-30 DIAGNOSIS — I10 Essential (primary) hypertension: Secondary | ICD-10-CM | POA: Diagnosis not present

## 2018-07-30 DIAGNOSIS — E11621 Type 2 diabetes mellitus with foot ulcer: Secondary | ICD-10-CM | POA: Diagnosis not present

## 2018-07-30 DIAGNOSIS — E1169 Type 2 diabetes mellitus with other specified complication: Secondary | ICD-10-CM | POA: Diagnosis not present

## 2018-07-30 DIAGNOSIS — M869 Osteomyelitis, unspecified: Secondary | ICD-10-CM | POA: Diagnosis not present

## 2018-08-01 DIAGNOSIS — E11621 Type 2 diabetes mellitus with foot ulcer: Secondary | ICD-10-CM | POA: Diagnosis not present

## 2018-08-01 DIAGNOSIS — M86372 Chronic multifocal osteomyelitis, left ankle and foot: Secondary | ICD-10-CM | POA: Diagnosis not present

## 2018-08-01 DIAGNOSIS — E11610 Type 2 diabetes mellitus with diabetic neuropathic arthropathy: Secondary | ICD-10-CM | POA: Diagnosis not present

## 2018-08-01 DIAGNOSIS — L97509 Non-pressure chronic ulcer of other part of unspecified foot with unspecified severity: Secondary | ICD-10-CM | POA: Diagnosis not present

## 2018-08-01 DIAGNOSIS — E1169 Type 2 diabetes mellitus with other specified complication: Secondary | ICD-10-CM | POA: Diagnosis not present

## 2018-08-01 DIAGNOSIS — M869 Osteomyelitis, unspecified: Secondary | ICD-10-CM | POA: Diagnosis not present

## 2018-08-02 ENCOUNTER — Encounter: Payer: Self-pay | Admitting: Family Medicine

## 2018-08-05 DIAGNOSIS — L97509 Non-pressure chronic ulcer of other part of unspecified foot with unspecified severity: Secondary | ICD-10-CM | POA: Diagnosis not present

## 2018-08-05 DIAGNOSIS — E11621 Type 2 diabetes mellitus with foot ulcer: Secondary | ICD-10-CM | POA: Diagnosis not present

## 2018-08-05 DIAGNOSIS — M869 Osteomyelitis, unspecified: Secondary | ICD-10-CM | POA: Diagnosis not present

## 2018-08-05 DIAGNOSIS — E1169 Type 2 diabetes mellitus with other specified complication: Secondary | ICD-10-CM | POA: Diagnosis not present

## 2018-08-07 DIAGNOSIS — H2511 Age-related nuclear cataract, right eye: Secondary | ICD-10-CM | POA: Diagnosis not present

## 2018-08-07 DIAGNOSIS — Z961 Presence of intraocular lens: Secondary | ICD-10-CM | POA: Diagnosis not present

## 2018-08-12 DIAGNOSIS — L97509 Non-pressure chronic ulcer of other part of unspecified foot with unspecified severity: Secondary | ICD-10-CM | POA: Diagnosis not present

## 2018-08-12 DIAGNOSIS — Z6841 Body Mass Index (BMI) 40.0 and over, adult: Secondary | ICD-10-CM | POA: Diagnosis not present

## 2018-08-12 DIAGNOSIS — Z9981 Dependence on supplemental oxygen: Secondary | ICD-10-CM | POA: Diagnosis not present

## 2018-08-12 DIAGNOSIS — M869 Osteomyelitis, unspecified: Secondary | ICD-10-CM | POA: Diagnosis not present

## 2018-08-12 DIAGNOSIS — E1169 Type 2 diabetes mellitus with other specified complication: Secondary | ICD-10-CM | POA: Diagnosis not present

## 2018-08-12 DIAGNOSIS — M86372 Chronic multifocal osteomyelitis, left ankle and foot: Secondary | ICD-10-CM | POA: Diagnosis not present

## 2018-08-12 DIAGNOSIS — E11621 Type 2 diabetes mellitus with foot ulcer: Secondary | ICD-10-CM | POA: Diagnosis not present

## 2018-08-12 DIAGNOSIS — E11610 Type 2 diabetes mellitus with diabetic neuropathic arthropathy: Secondary | ICD-10-CM | POA: Diagnosis not present

## 2018-08-13 DIAGNOSIS — T700XXA Otitic barotrauma, initial encounter: Secondary | ICD-10-CM | POA: Diagnosis not present

## 2018-08-13 DIAGNOSIS — E11610 Type 2 diabetes mellitus with diabetic neuropathic arthropathy: Secondary | ICD-10-CM | POA: Diagnosis not present

## 2018-08-13 DIAGNOSIS — M86372 Chronic multifocal osteomyelitis, left ankle and foot: Secondary | ICD-10-CM | POA: Diagnosis not present

## 2018-08-14 DIAGNOSIS — M86372 Chronic multifocal osteomyelitis, left ankle and foot: Secondary | ICD-10-CM | POA: Diagnosis not present

## 2018-08-14 DIAGNOSIS — E11610 Type 2 diabetes mellitus with diabetic neuropathic arthropathy: Secondary | ICD-10-CM | POA: Diagnosis not present

## 2018-08-14 DIAGNOSIS — T700XXA Otitic barotrauma, initial encounter: Secondary | ICD-10-CM | POA: Diagnosis not present

## 2018-08-14 DIAGNOSIS — E11621 Type 2 diabetes mellitus with foot ulcer: Secondary | ICD-10-CM | POA: Diagnosis not present

## 2018-08-14 DIAGNOSIS — E1149 Type 2 diabetes mellitus with other diabetic neurological complication: Secondary | ICD-10-CM | POA: Diagnosis not present

## 2018-08-15 DIAGNOSIS — M869 Osteomyelitis, unspecified: Secondary | ICD-10-CM | POA: Diagnosis not present

## 2018-08-15 DIAGNOSIS — E1169 Type 2 diabetes mellitus with other specified complication: Secondary | ICD-10-CM | POA: Diagnosis not present

## 2018-08-15 DIAGNOSIS — T7029XA Other effects of high altitude, initial encounter: Secondary | ICD-10-CM | POA: Diagnosis not present

## 2018-08-15 DIAGNOSIS — T700XXA Otitic barotrauma, initial encounter: Secondary | ICD-10-CM | POA: Diagnosis not present

## 2018-08-15 DIAGNOSIS — E11621 Type 2 diabetes mellitus with foot ulcer: Secondary | ICD-10-CM | POA: Diagnosis not present

## 2018-08-15 DIAGNOSIS — L97509 Non-pressure chronic ulcer of other part of unspecified foot with unspecified severity: Secondary | ICD-10-CM | POA: Diagnosis not present

## 2018-08-15 DIAGNOSIS — M86372 Chronic multifocal osteomyelitis, left ankle and foot: Secondary | ICD-10-CM | POA: Diagnosis not present

## 2018-08-15 DIAGNOSIS — E11610 Type 2 diabetes mellitus with diabetic neuropathic arthropathy: Secondary | ICD-10-CM | POA: Diagnosis not present

## 2018-08-18 DIAGNOSIS — M869 Osteomyelitis, unspecified: Secondary | ICD-10-CM | POA: Diagnosis not present

## 2018-08-18 DIAGNOSIS — E11610 Type 2 diabetes mellitus with diabetic neuropathic arthropathy: Secondary | ICD-10-CM | POA: Diagnosis not present

## 2018-08-18 DIAGNOSIS — L97509 Non-pressure chronic ulcer of other part of unspecified foot with unspecified severity: Secondary | ICD-10-CM | POA: Diagnosis not present

## 2018-08-18 DIAGNOSIS — E11621 Type 2 diabetes mellitus with foot ulcer: Secondary | ICD-10-CM | POA: Diagnosis not present

## 2018-08-18 DIAGNOSIS — L97422 Non-pressure chronic ulcer of left heel and midfoot with fat layer exposed: Secondary | ICD-10-CM | POA: Diagnosis not present

## 2018-08-18 DIAGNOSIS — E1169 Type 2 diabetes mellitus with other specified complication: Secondary | ICD-10-CM | POA: Diagnosis not present

## 2018-08-21 DIAGNOSIS — E1149 Type 2 diabetes mellitus with other diabetic neurological complication: Secondary | ICD-10-CM | POA: Diagnosis not present

## 2018-08-21 DIAGNOSIS — M86372 Chronic multifocal osteomyelitis, left ankle and foot: Secondary | ICD-10-CM | POA: Diagnosis not present

## 2018-08-21 DIAGNOSIS — T700XXA Otitic barotrauma, initial encounter: Secondary | ICD-10-CM | POA: Diagnosis not present

## 2018-08-21 DIAGNOSIS — E11610 Type 2 diabetes mellitus with diabetic neuropathic arthropathy: Secondary | ICD-10-CM | POA: Diagnosis not present

## 2018-08-22 DIAGNOSIS — M86372 Chronic multifocal osteomyelitis, left ankle and foot: Secondary | ICD-10-CM | POA: Diagnosis not present

## 2018-08-22 DIAGNOSIS — T700XXA Otitic barotrauma, initial encounter: Secondary | ICD-10-CM | POA: Diagnosis not present

## 2018-08-22 DIAGNOSIS — E1149 Type 2 diabetes mellitus with other diabetic neurological complication: Secondary | ICD-10-CM | POA: Diagnosis not present

## 2018-08-22 DIAGNOSIS — E11610 Type 2 diabetes mellitus with diabetic neuropathic arthropathy: Secondary | ICD-10-CM | POA: Diagnosis not present

## 2018-08-25 DIAGNOSIS — T700XXA Otitic barotrauma, initial encounter: Secondary | ICD-10-CM | POA: Diagnosis not present

## 2018-08-25 DIAGNOSIS — E1149 Type 2 diabetes mellitus with other diabetic neurological complication: Secondary | ICD-10-CM | POA: Diagnosis not present

## 2018-08-25 DIAGNOSIS — M86372 Chronic multifocal osteomyelitis, left ankle and foot: Secondary | ICD-10-CM | POA: Diagnosis not present

## 2018-08-25 DIAGNOSIS — E11610 Type 2 diabetes mellitus with diabetic neuropathic arthropathy: Secondary | ICD-10-CM | POA: Diagnosis not present

## 2018-08-26 DIAGNOSIS — E11610 Type 2 diabetes mellitus with diabetic neuropathic arthropathy: Secondary | ICD-10-CM | POA: Diagnosis not present

## 2018-08-26 DIAGNOSIS — T700XXA Otitic barotrauma, initial encounter: Secondary | ICD-10-CM | POA: Diagnosis not present

## 2018-08-26 DIAGNOSIS — E1149 Type 2 diabetes mellitus with other diabetic neurological complication: Secondary | ICD-10-CM | POA: Diagnosis not present

## 2018-08-26 DIAGNOSIS — M86372 Chronic multifocal osteomyelitis, left ankle and foot: Secondary | ICD-10-CM | POA: Diagnosis not present

## 2018-08-28 DIAGNOSIS — E11610 Type 2 diabetes mellitus with diabetic neuropathic arthropathy: Secondary | ICD-10-CM | POA: Diagnosis not present

## 2018-08-28 DIAGNOSIS — M86372 Chronic multifocal osteomyelitis, left ankle and foot: Secondary | ICD-10-CM | POA: Diagnosis not present

## 2018-08-29 DIAGNOSIS — E11610 Type 2 diabetes mellitus with diabetic neuropathic arthropathy: Secondary | ICD-10-CM | POA: Diagnosis not present

## 2018-08-29 DIAGNOSIS — M86372 Chronic multifocal osteomyelitis, left ankle and foot: Secondary | ICD-10-CM | POA: Diagnosis not present

## 2018-09-01 DIAGNOSIS — M86372 Chronic multifocal osteomyelitis, left ankle and foot: Secondary | ICD-10-CM | POA: Diagnosis not present

## 2018-09-01 DIAGNOSIS — E1169 Type 2 diabetes mellitus with other specified complication: Secondary | ICD-10-CM | POA: Diagnosis not present

## 2018-09-01 DIAGNOSIS — E11610 Type 2 diabetes mellitus with diabetic neuropathic arthropathy: Secondary | ICD-10-CM | POA: Diagnosis not present

## 2018-09-01 DIAGNOSIS — E11621 Type 2 diabetes mellitus with foot ulcer: Secondary | ICD-10-CM | POA: Diagnosis not present

## 2018-09-01 DIAGNOSIS — L97509 Non-pressure chronic ulcer of other part of unspecified foot with unspecified severity: Secondary | ICD-10-CM | POA: Diagnosis not present

## 2018-09-01 DIAGNOSIS — M869 Osteomyelitis, unspecified: Secondary | ICD-10-CM | POA: Diagnosis not present

## 2018-09-02 DIAGNOSIS — E11621 Type 2 diabetes mellitus with foot ulcer: Secondary | ICD-10-CM | POA: Diagnosis not present

## 2018-09-02 DIAGNOSIS — M86372 Chronic multifocal osteomyelitis, left ankle and foot: Secondary | ICD-10-CM | POA: Diagnosis not present

## 2018-09-02 DIAGNOSIS — T700XXA Otitic barotrauma, initial encounter: Secondary | ICD-10-CM | POA: Diagnosis not present

## 2018-09-03 DIAGNOSIS — T700XXA Otitic barotrauma, initial encounter: Secondary | ICD-10-CM | POA: Diagnosis not present

## 2018-09-03 DIAGNOSIS — E11621 Type 2 diabetes mellitus with foot ulcer: Secondary | ICD-10-CM | POA: Diagnosis not present

## 2018-09-03 DIAGNOSIS — M86372 Chronic multifocal osteomyelitis, left ankle and foot: Secondary | ICD-10-CM | POA: Diagnosis not present

## 2018-09-03 DIAGNOSIS — E11610 Type 2 diabetes mellitus with diabetic neuropathic arthropathy: Secondary | ICD-10-CM | POA: Diagnosis not present

## 2018-09-03 DIAGNOSIS — E1149 Type 2 diabetes mellitus with other diabetic neurological complication: Secondary | ICD-10-CM | POA: Diagnosis not present

## 2018-09-04 DIAGNOSIS — E1169 Type 2 diabetes mellitus with other specified complication: Secondary | ICD-10-CM | POA: Diagnosis not present

## 2018-09-04 DIAGNOSIS — M869 Osteomyelitis, unspecified: Secondary | ICD-10-CM | POA: Diagnosis not present

## 2018-09-04 DIAGNOSIS — L97509 Non-pressure chronic ulcer of other part of unspecified foot with unspecified severity: Secondary | ICD-10-CM | POA: Diagnosis not present

## 2018-09-04 DIAGNOSIS — M86372 Chronic multifocal osteomyelitis, left ankle and foot: Secondary | ICD-10-CM | POA: Diagnosis not present

## 2018-09-04 DIAGNOSIS — T700XXA Otitic barotrauma, initial encounter: Secondary | ICD-10-CM | POA: Diagnosis not present

## 2018-09-04 DIAGNOSIS — E11621 Type 2 diabetes mellitus with foot ulcer: Secondary | ICD-10-CM | POA: Diagnosis not present

## 2018-09-05 ENCOUNTER — Encounter: Payer: Self-pay | Admitting: Endocrinology

## 2018-09-05 DIAGNOSIS — M86372 Chronic multifocal osteomyelitis, left ankle and foot: Secondary | ICD-10-CM | POA: Diagnosis not present

## 2018-09-05 DIAGNOSIS — T700XXA Otitic barotrauma, initial encounter: Secondary | ICD-10-CM | POA: Diagnosis not present

## 2018-09-05 DIAGNOSIS — E11621 Type 2 diabetes mellitus with foot ulcer: Secondary | ICD-10-CM | POA: Diagnosis not present

## 2018-09-08 DIAGNOSIS — T700XXA Otitic barotrauma, initial encounter: Secondary | ICD-10-CM | POA: Diagnosis not present

## 2018-09-08 DIAGNOSIS — M86372 Chronic multifocal osteomyelitis, left ankle and foot: Secondary | ICD-10-CM | POA: Diagnosis not present

## 2018-09-08 DIAGNOSIS — E11621 Type 2 diabetes mellitus with foot ulcer: Secondary | ICD-10-CM | POA: Diagnosis not present

## 2018-09-08 NOTE — Telephone Encounter (Signed)
Please call pt to schedule appt to discuss biopsy results.

## 2018-09-09 ENCOUNTER — Ambulatory Visit: Payer: BLUE CROSS/BLUE SHIELD | Admitting: Endocrinology

## 2018-09-09 DIAGNOSIS — E11621 Type 2 diabetes mellitus with foot ulcer: Secondary | ICD-10-CM | POA: Diagnosis not present

## 2018-09-09 DIAGNOSIS — T700XXA Otitic barotrauma, initial encounter: Secondary | ICD-10-CM | POA: Diagnosis not present

## 2018-09-09 DIAGNOSIS — M86372 Chronic multifocal osteomyelitis, left ankle and foot: Secondary | ICD-10-CM | POA: Diagnosis not present

## 2018-09-10 ENCOUNTER — Ambulatory Visit: Payer: BLUE CROSS/BLUE SHIELD | Admitting: Endocrinology

## 2018-09-10 ENCOUNTER — Encounter: Payer: Self-pay | Admitting: Endocrinology

## 2018-09-10 VITALS — BP 114/64 | HR 82

## 2018-09-10 DIAGNOSIS — M86372 Chronic multifocal osteomyelitis, left ankle and foot: Secondary | ICD-10-CM | POA: Diagnosis not present

## 2018-09-10 DIAGNOSIS — T700XXA Otitic barotrauma, initial encounter: Secondary | ICD-10-CM | POA: Diagnosis not present

## 2018-09-10 DIAGNOSIS — Z794 Long term (current) use of insulin: Secondary | ICD-10-CM

## 2018-09-10 DIAGNOSIS — E11621 Type 2 diabetes mellitus with foot ulcer: Secondary | ICD-10-CM | POA: Diagnosis not present

## 2018-09-10 DIAGNOSIS — E1142 Type 2 diabetes mellitus with diabetic polyneuropathy: Secondary | ICD-10-CM | POA: Diagnosis not present

## 2018-09-10 LAB — POCT GLYCOSYLATED HEMOGLOBIN (HGB A1C): Hemoglobin A1C: 7 % — AB (ref 4.0–5.6)
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
## 2018-09-10 NOTE — Patient Instructions (Addendum)
Please continue 1-2 clicks per meal (except skip the breakfast clicks when you are going in the chamber).  Please come back for a follow-up appointment in 2 months.

## 2018-09-11 DIAGNOSIS — T700XXA Otitic barotrauma, initial encounter: Secondary | ICD-10-CM | POA: Diagnosis not present

## 2018-09-11 DIAGNOSIS — E11621 Type 2 diabetes mellitus with foot ulcer: Secondary | ICD-10-CM | POA: Diagnosis not present

## 2018-09-11 DIAGNOSIS — M86372 Chronic multifocal osteomyelitis, left ankle and foot: Secondary | ICD-10-CM | POA: Diagnosis not present

## 2018-09-12 DIAGNOSIS — T700XXA Otitic barotrauma, initial encounter: Secondary | ICD-10-CM | POA: Diagnosis not present

## 2018-09-12 DIAGNOSIS — M86372 Chronic multifocal osteomyelitis, left ankle and foot: Secondary | ICD-10-CM | POA: Diagnosis not present

## 2018-09-12 DIAGNOSIS — E11621 Type 2 diabetes mellitus with foot ulcer: Secondary | ICD-10-CM | POA: Diagnosis not present

## 2018-09-15 ENCOUNTER — Other Ambulatory Visit: Payer: Self-pay | Admitting: Endocrinology

## 2018-09-15 DIAGNOSIS — T700XXA Otitic barotrauma, initial encounter: Secondary | ICD-10-CM | POA: Diagnosis not present

## 2018-09-15 DIAGNOSIS — M86372 Chronic multifocal osteomyelitis, left ankle and foot: Secondary | ICD-10-CM | POA: Diagnosis not present

## 2018-09-15 DIAGNOSIS — E11621 Type 2 diabetes mellitus with foot ulcer: Secondary | ICD-10-CM | POA: Diagnosis not present

## 2018-09-16 DIAGNOSIS — E11621 Type 2 diabetes mellitus with foot ulcer: Secondary | ICD-10-CM | POA: Diagnosis not present

## 2018-09-16 DIAGNOSIS — T700XXA Otitic barotrauma, initial encounter: Secondary | ICD-10-CM | POA: Diagnosis not present

## 2018-09-16 DIAGNOSIS — M86372 Chronic multifocal osteomyelitis, left ankle and foot: Secondary | ICD-10-CM | POA: Diagnosis not present

## 2018-09-17 DIAGNOSIS — E11621 Type 2 diabetes mellitus with foot ulcer: Secondary | ICD-10-CM | POA: Diagnosis not present

## 2018-09-17 DIAGNOSIS — M86372 Chronic multifocal osteomyelitis, left ankle and foot: Secondary | ICD-10-CM | POA: Diagnosis not present

## 2018-09-17 DIAGNOSIS — T700XXA Otitic barotrauma, initial encounter: Secondary | ICD-10-CM | POA: Diagnosis not present

## 2018-09-18 DIAGNOSIS — T700XXA Otitic barotrauma, initial encounter: Secondary | ICD-10-CM | POA: Diagnosis not present

## 2018-09-18 DIAGNOSIS — M86372 Chronic multifocal osteomyelitis, left ankle and foot: Secondary | ICD-10-CM | POA: Diagnosis not present

## 2018-09-18 DIAGNOSIS — E11610 Type 2 diabetes mellitus with diabetic neuropathic arthropathy: Secondary | ICD-10-CM | POA: Diagnosis not present

## 2018-09-18 DIAGNOSIS — E1169 Type 2 diabetes mellitus with other specified complication: Secondary | ICD-10-CM | POA: Diagnosis not present

## 2018-09-18 DIAGNOSIS — E11621 Type 2 diabetes mellitus with foot ulcer: Secondary | ICD-10-CM | POA: Diagnosis not present

## 2018-09-18 DIAGNOSIS — L97509 Non-pressure chronic ulcer of other part of unspecified foot with unspecified severity: Secondary | ICD-10-CM | POA: Diagnosis not present

## 2018-09-18 DIAGNOSIS — M869 Osteomyelitis, unspecified: Secondary | ICD-10-CM | POA: Diagnosis not present

## 2018-09-18 DIAGNOSIS — E1143 Type 2 diabetes mellitus with diabetic autonomic (poly)neuropathy: Secondary | ICD-10-CM | POA: Diagnosis not present

## 2018-09-19 DIAGNOSIS — M86372 Chronic multifocal osteomyelitis, left ankle and foot: Secondary | ICD-10-CM | POA: Diagnosis not present

## 2018-09-19 DIAGNOSIS — T700XXA Otitic barotrauma, initial encounter: Secondary | ICD-10-CM | POA: Diagnosis not present

## 2018-09-19 DIAGNOSIS — E11621 Type 2 diabetes mellitus with foot ulcer: Secondary | ICD-10-CM | POA: Diagnosis not present

## 2018-09-22 DIAGNOSIS — E11621 Type 2 diabetes mellitus with foot ulcer: Secondary | ICD-10-CM | POA: Diagnosis not present

## 2018-09-22 DIAGNOSIS — M869 Osteomyelitis, unspecified: Secondary | ICD-10-CM | POA: Diagnosis not present

## 2018-09-22 DIAGNOSIS — E1169 Type 2 diabetes mellitus with other specified complication: Secondary | ICD-10-CM | POA: Diagnosis not present

## 2018-09-22 DIAGNOSIS — L97509 Non-pressure chronic ulcer of other part of unspecified foot with unspecified severity: Secondary | ICD-10-CM | POA: Diagnosis not present

## 2018-09-23 DIAGNOSIS — T700XXA Otitic barotrauma, initial encounter: Secondary | ICD-10-CM | POA: Diagnosis not present

## 2018-09-23 DIAGNOSIS — E11621 Type 2 diabetes mellitus with foot ulcer: Secondary | ICD-10-CM | POA: Diagnosis not present

## 2018-09-23 DIAGNOSIS — M86372 Chronic multifocal osteomyelitis, left ankle and foot: Secondary | ICD-10-CM | POA: Diagnosis not present

## 2018-09-24 DIAGNOSIS — M86372 Chronic multifocal osteomyelitis, left ankle and foot: Secondary | ICD-10-CM | POA: Diagnosis not present

## 2018-09-24 DIAGNOSIS — T700XXA Otitic barotrauma, initial encounter: Secondary | ICD-10-CM | POA: Diagnosis not present

## 2018-09-24 DIAGNOSIS — E11621 Type 2 diabetes mellitus with foot ulcer: Secondary | ICD-10-CM | POA: Diagnosis not present

## 2018-09-25 DIAGNOSIS — T700XXA Otitic barotrauma, initial encounter: Secondary | ICD-10-CM | POA: Diagnosis not present

## 2018-09-25 DIAGNOSIS — M86372 Chronic multifocal osteomyelitis, left ankle and foot: Secondary | ICD-10-CM | POA: Diagnosis not present

## 2018-09-25 DIAGNOSIS — E11621 Type 2 diabetes mellitus with foot ulcer: Secondary | ICD-10-CM | POA: Diagnosis not present

## 2018-09-26 DIAGNOSIS — T700XXA Otitic barotrauma, initial encounter: Secondary | ICD-10-CM | POA: Diagnosis not present

## 2018-09-26 DIAGNOSIS — E11621 Type 2 diabetes mellitus with foot ulcer: Secondary | ICD-10-CM | POA: Diagnosis not present

## 2018-09-26 DIAGNOSIS — M86372 Chronic multifocal osteomyelitis, left ankle and foot: Secondary | ICD-10-CM | POA: Diagnosis not present

## 2018-09-29 DIAGNOSIS — E11621 Type 2 diabetes mellitus with foot ulcer: Secondary | ICD-10-CM | POA: Diagnosis not present

## 2018-09-29 DIAGNOSIS — M86372 Chronic multifocal osteomyelitis, left ankle and foot: Secondary | ICD-10-CM | POA: Diagnosis not present

## 2018-09-29 DIAGNOSIS — T700XXA Otitic barotrauma, initial encounter: Secondary | ICD-10-CM | POA: Diagnosis not present

## 2018-09-30 DIAGNOSIS — T700XXA Otitic barotrauma, initial encounter: Secondary | ICD-10-CM | POA: Diagnosis not present

## 2018-09-30 DIAGNOSIS — M86372 Chronic multifocal osteomyelitis, left ankle and foot: Secondary | ICD-10-CM | POA: Diagnosis not present

## 2018-09-30 DIAGNOSIS — E11621 Type 2 diabetes mellitus with foot ulcer: Secondary | ICD-10-CM | POA: Diagnosis not present

## 2018-10-01 DIAGNOSIS — E11621 Type 2 diabetes mellitus with foot ulcer: Secondary | ICD-10-CM | POA: Diagnosis not present

## 2018-10-01 DIAGNOSIS — L97509 Non-pressure chronic ulcer of other part of unspecified foot with unspecified severity: Secondary | ICD-10-CM | POA: Diagnosis not present

## 2018-10-01 DIAGNOSIS — M86372 Chronic multifocal osteomyelitis, left ankle and foot: Secondary | ICD-10-CM | POA: Diagnosis not present

## 2018-10-01 DIAGNOSIS — E1169 Type 2 diabetes mellitus with other specified complication: Secondary | ICD-10-CM | POA: Diagnosis not present

## 2018-10-02 DIAGNOSIS — M86372 Chronic multifocal osteomyelitis, left ankle and foot: Secondary | ICD-10-CM | POA: Diagnosis not present

## 2018-10-02 DIAGNOSIS — E1149 Type 2 diabetes mellitus with other diabetic neurological complication: Secondary | ICD-10-CM | POA: Diagnosis not present

## 2018-10-02 DIAGNOSIS — M869 Osteomyelitis, unspecified: Secondary | ICD-10-CM | POA: Diagnosis not present

## 2018-10-02 DIAGNOSIS — L97509 Non-pressure chronic ulcer of other part of unspecified foot with unspecified severity: Secondary | ICD-10-CM | POA: Diagnosis not present

## 2018-10-02 DIAGNOSIS — E1169 Type 2 diabetes mellitus with other specified complication: Secondary | ICD-10-CM | POA: Diagnosis not present

## 2018-10-02 DIAGNOSIS — E11621 Type 2 diabetes mellitus with foot ulcer: Secondary | ICD-10-CM | POA: Diagnosis not present

## 2018-10-02 DIAGNOSIS — E11610 Type 2 diabetes mellitus with diabetic neuropathic arthropathy: Secondary | ICD-10-CM | POA: Diagnosis not present

## 2018-10-03 DIAGNOSIS — E11621 Type 2 diabetes mellitus with foot ulcer: Secondary | ICD-10-CM | POA: Diagnosis not present

## 2018-10-03 DIAGNOSIS — E1169 Type 2 diabetes mellitus with other specified complication: Secondary | ICD-10-CM | POA: Diagnosis not present

## 2018-10-03 DIAGNOSIS — M86372 Chronic multifocal osteomyelitis, left ankle and foot: Secondary | ICD-10-CM | POA: Diagnosis not present

## 2018-10-03 DIAGNOSIS — L97509 Non-pressure chronic ulcer of other part of unspecified foot with unspecified severity: Secondary | ICD-10-CM | POA: Diagnosis not present

## 2018-10-03 DIAGNOSIS — M869 Osteomyelitis, unspecified: Secondary | ICD-10-CM | POA: Diagnosis not present

## 2018-10-06 DIAGNOSIS — M86372 Chronic multifocal osteomyelitis, left ankle and foot: Secondary | ICD-10-CM | POA: Diagnosis not present

## 2018-10-06 DIAGNOSIS — L97509 Non-pressure chronic ulcer of other part of unspecified foot with unspecified severity: Secondary | ICD-10-CM | POA: Diagnosis not present

## 2018-10-06 DIAGNOSIS — E11621 Type 2 diabetes mellitus with foot ulcer: Secondary | ICD-10-CM | POA: Diagnosis not present

## 2018-10-06 DIAGNOSIS — E1169 Type 2 diabetes mellitus with other specified complication: Secondary | ICD-10-CM | POA: Diagnosis not present

## 2018-10-07 DIAGNOSIS — E11621 Type 2 diabetes mellitus with foot ulcer: Secondary | ICD-10-CM | POA: Diagnosis not present

## 2018-10-07 DIAGNOSIS — L97509 Non-pressure chronic ulcer of other part of unspecified foot with unspecified severity: Secondary | ICD-10-CM | POA: Diagnosis not present

## 2018-10-07 DIAGNOSIS — M86372 Chronic multifocal osteomyelitis, left ankle and foot: Secondary | ICD-10-CM | POA: Diagnosis not present

## 2018-10-07 DIAGNOSIS — E1169 Type 2 diabetes mellitus with other specified complication: Secondary | ICD-10-CM | POA: Diagnosis not present

## 2018-10-08 DIAGNOSIS — E11621 Type 2 diabetes mellitus with foot ulcer: Secondary | ICD-10-CM | POA: Diagnosis not present

## 2018-10-08 DIAGNOSIS — L97509 Non-pressure chronic ulcer of other part of unspecified foot with unspecified severity: Secondary | ICD-10-CM | POA: Diagnosis not present

## 2018-10-08 DIAGNOSIS — M86372 Chronic multifocal osteomyelitis, left ankle and foot: Secondary | ICD-10-CM | POA: Diagnosis not present

## 2018-10-08 DIAGNOSIS — E1169 Type 2 diabetes mellitus with other specified complication: Secondary | ICD-10-CM | POA: Diagnosis not present

## 2018-10-09 DIAGNOSIS — L97509 Non-pressure chronic ulcer of other part of unspecified foot with unspecified severity: Secondary | ICD-10-CM | POA: Diagnosis not present

## 2018-10-09 DIAGNOSIS — M86372 Chronic multifocal osteomyelitis, left ankle and foot: Secondary | ICD-10-CM | POA: Diagnosis not present

## 2018-10-09 DIAGNOSIS — E1169 Type 2 diabetes mellitus with other specified complication: Secondary | ICD-10-CM | POA: Diagnosis not present

## 2018-10-09 DIAGNOSIS — E11621 Type 2 diabetes mellitus with foot ulcer: Secondary | ICD-10-CM | POA: Diagnosis not present

## 2018-10-10 DIAGNOSIS — E1169 Type 2 diabetes mellitus with other specified complication: Secondary | ICD-10-CM | POA: Diagnosis not present

## 2018-10-10 DIAGNOSIS — M86372 Chronic multifocal osteomyelitis, left ankle and foot: Secondary | ICD-10-CM | POA: Diagnosis not present

## 2018-10-10 DIAGNOSIS — L97509 Non-pressure chronic ulcer of other part of unspecified foot with unspecified severity: Secondary | ICD-10-CM | POA: Diagnosis not present

## 2018-10-10 DIAGNOSIS — E11621 Type 2 diabetes mellitus with foot ulcer: Secondary | ICD-10-CM | POA: Diagnosis not present

## 2018-10-14 DIAGNOSIS — E11621 Type 2 diabetes mellitus with foot ulcer: Secondary | ICD-10-CM | POA: Diagnosis not present

## 2018-10-14 DIAGNOSIS — E1169 Type 2 diabetes mellitus with other specified complication: Secondary | ICD-10-CM | POA: Diagnosis not present

## 2018-10-14 DIAGNOSIS — L97509 Non-pressure chronic ulcer of other part of unspecified foot with unspecified severity: Secondary | ICD-10-CM | POA: Diagnosis not present

## 2018-10-14 DIAGNOSIS — M86372 Chronic multifocal osteomyelitis, left ankle and foot: Secondary | ICD-10-CM | POA: Diagnosis not present

## 2018-10-15 DIAGNOSIS — L97509 Non-pressure chronic ulcer of other part of unspecified foot with unspecified severity: Secondary | ICD-10-CM | POA: Diagnosis not present

## 2018-10-15 DIAGNOSIS — E1169 Type 2 diabetes mellitus with other specified complication: Secondary | ICD-10-CM | POA: Diagnosis not present

## 2018-10-15 DIAGNOSIS — M86372 Chronic multifocal osteomyelitis, left ankle and foot: Secondary | ICD-10-CM | POA: Diagnosis not present

## 2018-10-15 DIAGNOSIS — E11621 Type 2 diabetes mellitus with foot ulcer: Secondary | ICD-10-CM | POA: Diagnosis not present

## 2018-10-16 DIAGNOSIS — L97509 Non-pressure chronic ulcer of other part of unspecified foot with unspecified severity: Secondary | ICD-10-CM | POA: Diagnosis not present

## 2018-10-16 DIAGNOSIS — E11610 Type 2 diabetes mellitus with diabetic neuropathic arthropathy: Secondary | ICD-10-CM | POA: Diagnosis not present

## 2018-10-16 DIAGNOSIS — E1169 Type 2 diabetes mellitus with other specified complication: Secondary | ICD-10-CM | POA: Diagnosis not present

## 2018-10-16 DIAGNOSIS — E11621 Type 2 diabetes mellitus with foot ulcer: Secondary | ICD-10-CM | POA: Diagnosis not present

## 2018-10-16 DIAGNOSIS — M86372 Chronic multifocal osteomyelitis, left ankle and foot: Secondary | ICD-10-CM | POA: Diagnosis not present

## 2018-10-16 DIAGNOSIS — M869 Osteomyelitis, unspecified: Secondary | ICD-10-CM | POA: Diagnosis not present

## 2018-10-27 DIAGNOSIS — M869 Osteomyelitis, unspecified: Secondary | ICD-10-CM | POA: Diagnosis not present

## 2018-10-27 DIAGNOSIS — E1169 Type 2 diabetes mellitus with other specified complication: Secondary | ICD-10-CM | POA: Diagnosis not present

## 2018-10-27 DIAGNOSIS — E11621 Type 2 diabetes mellitus with foot ulcer: Secondary | ICD-10-CM | POA: Diagnosis not present

## 2018-10-27 DIAGNOSIS — L97509 Non-pressure chronic ulcer of other part of unspecified foot with unspecified severity: Secondary | ICD-10-CM | POA: Diagnosis not present

## 2018-11-06 DIAGNOSIS — H2511 Age-related nuclear cataract, right eye: Secondary | ICD-10-CM | POA: Diagnosis not present

## 2018-11-11 ENCOUNTER — Telehealth: Payer: Self-pay

## 2018-11-11 ENCOUNTER — Encounter: Payer: Self-pay | Admitting: Endocrinology

## 2018-11-11 ENCOUNTER — Ambulatory Visit (INDEPENDENT_AMBULATORY_CARE_PROVIDER_SITE_OTHER): Payer: BLUE CROSS/BLUE SHIELD | Admitting: Endocrinology

## 2018-11-11 ENCOUNTER — Other Ambulatory Visit: Payer: Self-pay

## 2018-11-11 VITALS — BP 142/62 | HR 78 | Ht 68.0 in | Wt 263.2 lb

## 2018-11-11 DIAGNOSIS — E1142 Type 2 diabetes mellitus with diabetic polyneuropathy: Secondary | ICD-10-CM | POA: Diagnosis not present

## 2018-11-11 DIAGNOSIS — Z794 Long term (current) use of insulin: Secondary | ICD-10-CM | POA: Diagnosis not present

## 2018-11-11 LAB — POCT GLYCOSYLATED HEMOGLOBIN (HGB A1C): HEMOGLOBIN A1C: 7.5 % — AB (ref 4.0–5.6)

## 2018-11-11 MED ORDER — SEMAGLUTIDE(0.25 OR 0.5MG/DOS) 2 MG/1.5ML ~~LOC~~ SOPN: 0.5000 mg | PEN_INJECTOR | SUBCUTANEOUS | 11 refills | Status: DC

## 2018-11-11 NOTE — Patient Instructions (Addendum)
I have sent a prescription to your pharmacy, to change Onglyza to "Ozempic."  We can increase this if necessary.  check your blood sugar twice a day.  vary the time of day when you check, between before the 3 meals, and at bedtime.  also check if you have symptoms of your blood sugar being too high or too low.  please keep a record of the readings and bring it to your next appointment here (or you can bring the meter itself).  You can write it on any piece of paper.  please call us sooner if your blood sugar goes below 70, or if you have a lot of readings over 200. Please continue the same V-GO clicks.  Please come back for a follow-up appointment in 2 months.
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
## 2018-11-11 NOTE — Telephone Encounter (Signed)
Pt states she has been having ongoing issues with [facility] about her VGo and her Freestyle refills. States we have verified orders but Denzil Hughes continues to tell her that orders still need verified. Has had to pay out of pocket for her refills because of this issue. Requesting to speak with Diabetic Educator to determine what can be done.

## 2018-11-12 DIAGNOSIS — E11610 Type 2 diabetes mellitus with diabetic neuropathic arthropathy: Secondary | ICD-10-CM | POA: Diagnosis not present

## 2018-11-12 DIAGNOSIS — E11621 Type 2 diabetes mellitus with foot ulcer: Secondary | ICD-10-CM | POA: Diagnosis not present

## 2018-11-12 DIAGNOSIS — M869 Osteomyelitis, unspecified: Secondary | ICD-10-CM | POA: Diagnosis not present

## 2018-11-12 DIAGNOSIS — E1169 Type 2 diabetes mellitus with other specified complication: Secondary | ICD-10-CM | POA: Diagnosis not present

## 2018-11-12 DIAGNOSIS — E1140 Type 2 diabetes mellitus with diabetic neuropathy, unspecified: Secondary | ICD-10-CM | POA: Diagnosis not present

## 2018-11-12 DIAGNOSIS — I10 Essential (primary) hypertension: Secondary | ICD-10-CM | POA: Diagnosis not present

## 2018-11-12 DIAGNOSIS — L97509 Non-pressure chronic ulcer of other part of unspecified foot with unspecified severity: Secondary | ICD-10-CM | POA: Diagnosis not present

## 2018-11-18 DIAGNOSIS — E11621 Type 2 diabetes mellitus with foot ulcer: Secondary | ICD-10-CM | POA: Diagnosis not present

## 2018-11-19 NOTE — Telephone Encounter (Signed)
I phoned [facility] and they took it off review and will send this out to her tomorrow.  Patient was notified and she was very grateful.

## 2018-11-28 ENCOUNTER — Encounter: Payer: Self-pay | Admitting: Endocrinology

## 2018-12-01 NOTE — Telephone Encounter (Signed)
Please advise 

## 2018-12-02 ENCOUNTER — Telehealth: Payer: Self-pay | Admitting: Nutrition

## 2018-12-02 NOTE — Telephone Encounter (Signed)
I phoned [facility], and they say that paperwork was sent for new order for V-go 40s.  They also said that they are on backorder.  Paperwork is on Dr. Cordelia Pen desk for signature, and patient was called. I explained to her what was happening, and that I had 5 sample v-gos that I could give her.  She said that she will use the [facility] to [facility] supply, but that she will have to pay $75.00.  She only pays $75. , for a 3 month supply at [facility].  She refused the samples at this time.

## 2018-12-05 DIAGNOSIS — E118 Type 2 diabetes mellitus with unspecified complications: Secondary | ICD-10-CM | POA: Diagnosis not present

## 2018-12-07 ENCOUNTER — Other Ambulatory Visit: Payer: Self-pay | Admitting: Family Medicine

## 2018-12-15 DIAGNOSIS — E1149 Type 2 diabetes mellitus with other diabetic neurological complication: Secondary | ICD-10-CM | POA: Diagnosis not present

## 2018-12-15 DIAGNOSIS — I10 Essential (primary) hypertension: Secondary | ICD-10-CM | POA: Diagnosis not present

## 2018-12-15 DIAGNOSIS — E11621 Type 2 diabetes mellitus with foot ulcer: Secondary | ICD-10-CM | POA: Diagnosis not present

## 2018-12-15 DIAGNOSIS — E1165 Type 2 diabetes mellitus with hyperglycemia: Secondary | ICD-10-CM | POA: Diagnosis not present

## 2018-12-15 DIAGNOSIS — L97421 Non-pressure chronic ulcer of left heel and midfoot limited to breakdown of skin: Secondary | ICD-10-CM | POA: Diagnosis not present

## 2019-01-06 DIAGNOSIS — Z794 Long term (current) use of insulin: Secondary | ICD-10-CM | POA: Diagnosis not present

## 2019-01-06 DIAGNOSIS — E118 Type 2 diabetes mellitus with unspecified complications: Secondary | ICD-10-CM | POA: Diagnosis not present

## 2019-01-15 DIAGNOSIS — E1149 Type 2 diabetes mellitus with other diabetic neurological complication: Secondary | ICD-10-CM | POA: Diagnosis not present

## 2019-01-15 DIAGNOSIS — H4311 Vitreous hemorrhage, right eye: Secondary | ICD-10-CM | POA: Diagnosis not present

## 2019-01-15 DIAGNOSIS — E1165 Type 2 diabetes mellitus with hyperglycemia: Secondary | ICD-10-CM | POA: Diagnosis not present

## 2019-01-15 DIAGNOSIS — E113552 Type 2 diabetes mellitus with stable proliferative diabetic retinopathy, left eye: Secondary | ICD-10-CM | POA: Diagnosis not present

## 2019-01-15 DIAGNOSIS — E11621 Type 2 diabetes mellitus with foot ulcer: Secondary | ICD-10-CM | POA: Diagnosis not present

## 2019-01-15 DIAGNOSIS — I10 Essential (primary) hypertension: Secondary | ICD-10-CM | POA: Diagnosis not present

## 2019-01-15 DIAGNOSIS — L97421 Non-pressure chronic ulcer of left heel and midfoot limited to breakdown of skin: Secondary | ICD-10-CM | POA: Diagnosis not present

## 2019-01-15 DIAGNOSIS — E113591 Type 2 diabetes mellitus with proliferative diabetic retinopathy without macular edema, right eye: Secondary | ICD-10-CM | POA: Diagnosis not present

## 2019-01-15 DIAGNOSIS — H2511 Age-related nuclear cataract, right eye: Secondary | ICD-10-CM | POA: Diagnosis not present

## 2019-02-11 DIAGNOSIS — L84 Corns and callosities: Secondary | ICD-10-CM | POA: Diagnosis not present

## 2019-02-11 DIAGNOSIS — E1149 Type 2 diabetes mellitus with other diabetic neurological complication: Secondary | ICD-10-CM | POA: Diagnosis not present

## 2019-02-11 DIAGNOSIS — I10 Essential (primary) hypertension: Secondary | ICD-10-CM | POA: Diagnosis not present

## 2019-02-11 DIAGNOSIS — E1165 Type 2 diabetes mellitus with hyperglycemia: Secondary | ICD-10-CM | POA: Diagnosis not present

## 2019-02-11 DIAGNOSIS — E11610 Type 2 diabetes mellitus with diabetic neuropathic arthropathy: Secondary | ICD-10-CM | POA: Diagnosis not present

## 2019-02-14 DIAGNOSIS — L84 Corns and callosities: Secondary | ICD-10-CM | POA: Insufficient documentation

## 2019-02-21 DIAGNOSIS — M869 Osteomyelitis, unspecified: Secondary | ICD-10-CM | POA: Diagnosis not present

## 2019-02-21 DIAGNOSIS — M7752 Other enthesopathy of left foot: Secondary | ICD-10-CM | POA: Insufficient documentation

## 2019-02-21 DIAGNOSIS — L97422 Non-pressure chronic ulcer of left heel and midfoot with fat layer exposed: Secondary | ICD-10-CM | POA: Diagnosis not present

## 2019-02-21 DIAGNOSIS — F4310 Post-traumatic stress disorder, unspecified: Secondary | ICD-10-CM | POA: Diagnosis not present

## 2019-02-21 DIAGNOSIS — M86172 Other acute osteomyelitis, left ankle and foot: Secondary | ICD-10-CM | POA: Diagnosis not present

## 2019-02-21 DIAGNOSIS — M71572 Other bursitis, not elsewhere classified, left ankle and foot: Secondary | ICD-10-CM | POA: Diagnosis not present

## 2019-02-21 DIAGNOSIS — E785 Hyperlipidemia, unspecified: Secondary | ICD-10-CM | POA: Diagnosis not present

## 2019-02-21 DIAGNOSIS — Z794 Long term (current) use of insulin: Secondary | ICD-10-CM | POA: Diagnosis not present

## 2019-02-21 DIAGNOSIS — E1165 Type 2 diabetes mellitus with hyperglycemia: Secondary | ICD-10-CM | POA: Insufficient documentation

## 2019-02-21 DIAGNOSIS — L97426 Non-pressure chronic ulcer of left heel and midfoot with bone involvement without evidence of necrosis: Secondary | ICD-10-CM | POA: Diagnosis not present

## 2019-02-21 DIAGNOSIS — F419 Anxiety disorder, unspecified: Secondary | ICD-10-CM | POA: Diagnosis not present

## 2019-02-21 DIAGNOSIS — E11621 Type 2 diabetes mellitus with foot ulcer: Secondary | ICD-10-CM | POA: Diagnosis not present

## 2019-02-21 DIAGNOSIS — L02612 Cutaneous abscess of left foot: Secondary | ICD-10-CM | POA: Diagnosis not present

## 2019-02-21 DIAGNOSIS — M25775 Osteophyte, left foot: Secondary | ICD-10-CM | POA: Diagnosis not present

## 2019-02-21 DIAGNOSIS — A5216 Charcot's arthropathy (tabetic): Secondary | ICD-10-CM | POA: Diagnosis not present

## 2019-02-21 DIAGNOSIS — L97529 Non-pressure chronic ulcer of other part of left foot with unspecified severity: Secondary | ICD-10-CM | POA: Diagnosis not present

## 2019-02-21 DIAGNOSIS — G8918 Other acute postprocedural pain: Secondary | ICD-10-CM | POA: Diagnosis not present

## 2019-02-21 DIAGNOSIS — F329 Major depressive disorder, single episode, unspecified: Secondary | ICD-10-CM | POA: Diagnosis not present

## 2019-02-21 DIAGNOSIS — M6702 Short Achilles tendon (acquired), left ankle: Secondary | ICD-10-CM | POA: Insufficient documentation

## 2019-02-21 DIAGNOSIS — I10 Essential (primary) hypertension: Secondary | ICD-10-CM | POA: Diagnosis not present

## 2019-02-21 DIAGNOSIS — Z9884 Bariatric surgery status: Secondary | ICD-10-CM | POA: Diagnosis not present

## 2019-02-21 DIAGNOSIS — E11628 Type 2 diabetes mellitus with other skin complications: Secondary | ICD-10-CM | POA: Diagnosis not present

## 2019-02-21 DIAGNOSIS — E11610 Type 2 diabetes mellitus with diabetic neuropathic arthropathy: Secondary | ICD-10-CM | POA: Diagnosis not present

## 2019-02-21 DIAGNOSIS — E1149 Type 2 diabetes mellitus with other diabetic neurological complication: Secondary | ICD-10-CM | POA: Diagnosis not present

## 2019-02-21 DIAGNOSIS — E1169 Type 2 diabetes mellitus with other specified complication: Secondary | ICD-10-CM | POA: Diagnosis not present

## 2019-02-21 DIAGNOSIS — M7989 Other specified soft tissue disorders: Secondary | ICD-10-CM | POA: Diagnosis not present

## 2019-02-21 DIAGNOSIS — E669 Obesity, unspecified: Secondary | ICD-10-CM | POA: Diagnosis not present

## 2019-02-21 DIAGNOSIS — M7732 Calcaneal spur, left foot: Secondary | ICD-10-CM | POA: Diagnosis not present

## 2019-02-21 DIAGNOSIS — Z6838 Body mass index (BMI) 38.0-38.9, adult: Secondary | ICD-10-CM | POA: Diagnosis not present

## 2019-02-22 DIAGNOSIS — M86172 Other acute osteomyelitis, left ankle and foot: Secondary | ICD-10-CM | POA: Diagnosis not present

## 2019-02-22 DIAGNOSIS — E1149 Type 2 diabetes mellitus with other diabetic neurological complication: Secondary | ICD-10-CM | POA: Diagnosis not present

## 2019-02-22 DIAGNOSIS — E11628 Type 2 diabetes mellitus with other skin complications: Secondary | ICD-10-CM | POA: Diagnosis not present

## 2019-02-22 DIAGNOSIS — E11610 Type 2 diabetes mellitus with diabetic neuropathic arthropathy: Secondary | ICD-10-CM | POA: Diagnosis not present

## 2019-02-23 DIAGNOSIS — E1149 Type 2 diabetes mellitus with other diabetic neurological complication: Secondary | ICD-10-CM | POA: Diagnosis not present

## 2019-02-23 DIAGNOSIS — M86172 Other acute osteomyelitis, left ankle and foot: Secondary | ICD-10-CM | POA: Diagnosis not present

## 2019-02-23 DIAGNOSIS — E11628 Type 2 diabetes mellitus with other skin complications: Secondary | ICD-10-CM | POA: Diagnosis not present

## 2019-02-23 DIAGNOSIS — E11610 Type 2 diabetes mellitus with diabetic neuropathic arthropathy: Secondary | ICD-10-CM | POA: Diagnosis not present

## 2019-02-24 ENCOUNTER — Ambulatory Visit: Payer: BLUE CROSS/BLUE SHIELD | Admitting: Endocrinology

## 2019-02-24 DIAGNOSIS — M86172 Other acute osteomyelitis, left ankle and foot: Secondary | ICD-10-CM | POA: Diagnosis not present

## 2019-02-24 DIAGNOSIS — E11628 Type 2 diabetes mellitus with other skin complications: Secondary | ICD-10-CM | POA: Diagnosis not present

## 2019-02-24 DIAGNOSIS — E11610 Type 2 diabetes mellitus with diabetic neuropathic arthropathy: Secondary | ICD-10-CM | POA: Diagnosis not present

## 2019-02-24 DIAGNOSIS — E1149 Type 2 diabetes mellitus with other diabetic neurological complication: Secondary | ICD-10-CM | POA: Diagnosis not present

## 2019-02-25 DIAGNOSIS — M86172 Other acute osteomyelitis, left ankle and foot: Secondary | ICD-10-CM | POA: Diagnosis not present

## 2019-02-26 MED ORDER — SODIUM CHLORIDE 0.9 % IV SOLN
10.00 | INTRAVENOUS | Status: DC
Start: ? — End: 2019-02-26

## 2019-02-26 MED ORDER — INSULIN LISPRO 100 UNIT/ML ~~LOC~~ SOLN
1.00 | SUBCUTANEOUS | Status: DC
Start: ? — End: 2019-02-26

## 2019-02-26 MED ORDER — GENERIC EXTERNAL MEDICATION
Status: DC
Start: ? — End: 2019-02-26

## 2019-02-26 MED ORDER — DIPHENHYDRAMINE HCL 50 MG/ML IJ SOLN
12.50 | INTRAMUSCULAR | Status: DC
Start: ? — End: 2019-02-26

## 2019-02-26 MED ORDER — HYDROCODONE-ACETAMINOPHEN 5-325 MG PO TABS
1.00 | ORAL_TABLET | ORAL | Status: DC
Start: ? — End: 2019-02-26

## 2019-02-26 MED ORDER — ACETAMINOPHEN 325 MG PO TABS
650.00 | ORAL_TABLET | ORAL | Status: DC
Start: ? — End: 2019-02-26

## 2019-02-26 MED ORDER — DOCUSATE SODIUM 100 MG PO CAPS
100.00 | ORAL_CAPSULE | ORAL | Status: DC
Start: 2019-02-25 — End: 2019-02-26

## 2019-02-26 MED ORDER — INSULIN GLARGINE 100 UNIT/ML ~~LOC~~ SOLN
1.00 | SUBCUTANEOUS | Status: DC
Start: 2019-02-25 — End: 2019-02-26

## 2019-02-26 MED ORDER — INSULIN LISPRO 100 UNIT/ML ~~LOC~~ SOLN
1.00 | SUBCUTANEOUS | Status: DC
Start: 2019-02-25 — End: 2019-02-26

## 2019-02-26 MED ORDER — LISINOPRIL 5 MG PO TABS
2.50 | ORAL_TABLET | ORAL | Status: DC
Start: 2019-02-26 — End: 2019-02-26

## 2019-02-26 MED ORDER — METRONIDAZOLE 500 MG PO TABS
500.00 | ORAL_TABLET | ORAL | Status: DC
Start: 2019-02-25 — End: 2019-02-26

## 2019-02-26 MED ORDER — GENERIC EXTERNAL MEDICATION
1.50 | Status: DC
Start: 2019-02-25 — End: 2019-02-26

## 2019-02-26 MED ORDER — GUAIFENESIN 100 MG/5ML PO LIQD
10.00 | ORAL | Status: DC
Start: ? — End: 2019-02-26

## 2019-02-26 MED ORDER — PREGABALIN 150 MG PO CAPS
150.00 | ORAL_CAPSULE | ORAL | Status: DC
Start: 2019-02-25 — End: 2019-02-26

## 2019-02-26 MED ORDER — ONDANSETRON HCL 4 MG PO TABS
4.00 | ORAL_TABLET | ORAL | Status: DC
Start: ? — End: 2019-02-26

## 2019-02-26 MED ORDER — HYDROCODONE-ACETAMINOPHEN 10-325 MG PO TABS
1.00 | ORAL_TABLET | ORAL | Status: DC
Start: ? — End: 2019-02-26

## 2019-02-26 MED ORDER — BUPROPION HCL 100 MG PO TABS
300.00 | ORAL_TABLET | ORAL | Status: DC
Start: 2019-02-26 — End: 2019-02-26

## 2019-02-26 MED ORDER — INSULIN GLARGINE 100 UNIT/ML ~~LOC~~ SOLN
1.00 | SUBCUTANEOUS | Status: DC
Start: ? — End: 2019-02-26

## 2019-02-26 MED ORDER — AMITRIPTYLINE HCL 75 MG PO TABS
75.00 | ORAL_TABLET | ORAL | Status: DC
Start: 2019-02-25 — End: 2019-02-26

## 2019-03-04 DIAGNOSIS — M86172 Other acute osteomyelitis, left ankle and foot: Secondary | ICD-10-CM | POA: Diagnosis not present

## 2019-03-09 DIAGNOSIS — Z4789 Encounter for other orthopedic aftercare: Secondary | ICD-10-CM | POA: Diagnosis not present

## 2019-03-11 ENCOUNTER — Other Ambulatory Visit: Payer: Self-pay | Admitting: Endocrinology

## 2019-03-11 DIAGNOSIS — M86172 Other acute osteomyelitis, left ankle and foot: Secondary | ICD-10-CM | POA: Diagnosis not present

## 2019-03-19 DIAGNOSIS — M869 Osteomyelitis, unspecified: Secondary | ICD-10-CM | POA: Diagnosis not present

## 2019-03-19 DIAGNOSIS — E1169 Type 2 diabetes mellitus with other specified complication: Secondary | ICD-10-CM | POA: Diagnosis not present

## 2019-03-19 DIAGNOSIS — I10 Essential (primary) hypertension: Secondary | ICD-10-CM | POA: Diagnosis not present

## 2019-03-19 DIAGNOSIS — M86172 Other acute osteomyelitis, left ankle and foot: Secondary | ICD-10-CM | POA: Diagnosis not present

## 2019-03-19 DIAGNOSIS — L97509 Non-pressure chronic ulcer of other part of unspecified foot with unspecified severity: Secondary | ICD-10-CM | POA: Diagnosis not present

## 2019-03-19 DIAGNOSIS — E1140 Type 2 diabetes mellitus with diabetic neuropathy, unspecified: Secondary | ICD-10-CM | POA: Diagnosis not present

## 2019-03-19 DIAGNOSIS — E11621 Type 2 diabetes mellitus with foot ulcer: Secondary | ICD-10-CM | POA: Diagnosis not present

## 2019-03-19 DIAGNOSIS — E11610 Type 2 diabetes mellitus with diabetic neuropathic arthropathy: Secondary | ICD-10-CM | POA: Diagnosis not present

## 2019-03-23 DIAGNOSIS — E1149 Type 2 diabetes mellitus with other diabetic neurological complication: Secondary | ICD-10-CM | POA: Diagnosis not present

## 2019-03-23 DIAGNOSIS — E1165 Type 2 diabetes mellitus with hyperglycemia: Secondary | ICD-10-CM | POA: Diagnosis not present

## 2019-03-26 DIAGNOSIS — M86172 Other acute osteomyelitis, left ankle and foot: Secondary | ICD-10-CM | POA: Diagnosis not present

## 2019-04-02 DIAGNOSIS — M86172 Other acute osteomyelitis, left ankle and foot: Secondary | ICD-10-CM | POA: Diagnosis not present

## 2019-04-07 ENCOUNTER — Other Ambulatory Visit: Payer: Self-pay | Admitting: Endocrinology

## 2019-04-15 DIAGNOSIS — E11610 Type 2 diabetes mellitus with diabetic neuropathic arthropathy: Secondary | ICD-10-CM | POA: Diagnosis not present

## 2019-04-15 DIAGNOSIS — Z4789 Encounter for other orthopedic aftercare: Secondary | ICD-10-CM | POA: Diagnosis not present

## 2019-04-16 DIAGNOSIS — E113591 Type 2 diabetes mellitus with proliferative diabetic retinopathy without macular edema, right eye: Secondary | ICD-10-CM | POA: Diagnosis not present

## 2019-04-16 DIAGNOSIS — H2511 Age-related nuclear cataract, right eye: Secondary | ICD-10-CM | POA: Diagnosis not present

## 2019-04-16 DIAGNOSIS — H4311 Vitreous hemorrhage, right eye: Secondary | ICD-10-CM | POA: Diagnosis not present

## 2019-04-16 DIAGNOSIS — E113552 Type 2 diabetes mellitus with stable proliferative diabetic retinopathy, left eye: Secondary | ICD-10-CM | POA: Diagnosis not present

## 2019-04-16 DIAGNOSIS — Z961 Presence of intraocular lens: Secondary | ICD-10-CM | POA: Diagnosis not present

## 2019-04-22 DIAGNOSIS — I10 Essential (primary) hypertension: Secondary | ICD-10-CM | POA: Diagnosis not present

## 2019-04-22 DIAGNOSIS — E1140 Type 2 diabetes mellitus with diabetic neuropathy, unspecified: Secondary | ICD-10-CM | POA: Diagnosis not present

## 2019-04-22 DIAGNOSIS — M869 Osteomyelitis, unspecified: Secondary | ICD-10-CM | POA: Diagnosis not present

## 2019-04-22 DIAGNOSIS — L97509 Non-pressure chronic ulcer of other part of unspecified foot with unspecified severity: Secondary | ICD-10-CM | POA: Diagnosis not present

## 2019-04-22 DIAGNOSIS — Z23 Encounter for immunization: Secondary | ICD-10-CM | POA: Diagnosis not present

## 2019-04-22 DIAGNOSIS — E1169 Type 2 diabetes mellitus with other specified complication: Secondary | ICD-10-CM | POA: Diagnosis not present

## 2019-04-22 DIAGNOSIS — E11610 Type 2 diabetes mellitus with diabetic neuropathic arthropathy: Secondary | ICD-10-CM | POA: Diagnosis not present

## 2019-04-22 DIAGNOSIS — E11621 Type 2 diabetes mellitus with foot ulcer: Secondary | ICD-10-CM | POA: Diagnosis not present

## 2019-04-30 ENCOUNTER — Telehealth: Payer: Self-pay | Admitting: Endocrinology

## 2019-04-30 NOTE — Telephone Encounter (Signed)
1.  Please schedule f/u appt 2.  Then please refill x 1, pending that appt.  

## 2019-04-30 NOTE — Telephone Encounter (Signed)
LOV 11/11/18. Per your office note, pt was advised to f/u 2 months. Pt canceled her appt 02/24/19. No future appt noted. We have refilled x2 on 04/07/19 and 03/11/19 ALL with request that pt call to schedule appt. Please advise.

## 2019-04-30 NOTE — Telephone Encounter (Signed)
Per Dr. Loanne Drilling, unable to refill Novolog without an appt. Routing this message to the front desk for scheduling purposes.

## 2019-04-30 NOTE — Telephone Encounter (Signed)
Completed. 05/07/19 @ 10:45 a.m.

## 2019-05-01 ENCOUNTER — Other Ambulatory Visit: Payer: Self-pay

## 2019-05-01 DIAGNOSIS — Z794 Long term (current) use of insulin: Secondary | ICD-10-CM

## 2019-05-01 DIAGNOSIS — E1142 Type 2 diabetes mellitus with diabetic polyneuropathy: Secondary | ICD-10-CM

## 2019-05-01 MED ORDER — INSULIN ASPART 100 UNIT/ML ~~LOC~~ SOLN: SUBCUTANEOUS | 0 refills | Status: DC

## 2019-05-01 NOTE — Telephone Encounter (Signed)
insulin aspart (NOVOLOG) 100 UNIT/ML injection 20 mL 0 05/01/2019    Sig: FOR USE IN PUMP, FOR A TOTAL OF 70 UNITS DAILY   Sent to pharmacy as: insulin aspart (NOVOLOG) 100 UNIT/ML injection   E-Prescribing Status: Receipt confirmed by pharmacy (05/01/2019 7:22 AM EDT)

## 2019-05-05 ENCOUNTER — Encounter: Payer: Self-pay | Admitting: Family Medicine

## 2019-05-05 ENCOUNTER — Other Ambulatory Visit: Payer: Self-pay

## 2019-05-05 ENCOUNTER — Ambulatory Visit (INDEPENDENT_AMBULATORY_CARE_PROVIDER_SITE_OTHER): Payer: BC Managed Care – PPO | Admitting: Family Medicine

## 2019-05-05 VITALS — BP 139/84 | HR 85 | Temp 98.8°F | Ht 68.0 in | Wt 250.0 lb

## 2019-05-05 DIAGNOSIS — F4322 Adjustment disorder with anxiety: Secondary | ICD-10-CM

## 2019-05-05 MED ORDER — BUPROPION HCL 100 MG PO TABS: 100.0000 mg | ORAL_TABLET | Freq: Two times a day (BID) | ORAL | 2 refills | Status: DC

## 2019-05-05 MED ORDER — PREGABALIN 150 MG PO CAPS: 150.0000 mg | ORAL_CAPSULE | Freq: Two times a day (BID) | ORAL | Status: DC

## 2019-05-05 MED ORDER — AMITRIPTYLINE HCL 75 MG PO TABS: 75.0000 mg | ORAL_TABLET | Freq: Every day | ORAL | Status: AC
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
## 2019-05-05 NOTE — Patient Instructions (Signed)
° ° ° °  If you have lab work done today you will be contacted with your lab results within the next 2 weeks.  If you have not heard from us then please contact us. The fastest way to get your results is to register for My Chart. ° ° °IF you received an x-ray today, you will receive an invoice from [facility]. Please contact [facility] at [phone] with questions or concerns regarding your invoice.  ° °IF you received labwork today, you will receive an invoice from [facility]. Please contact [facility] at [phone] with questions or concerns regarding your invoice.  ° °Our billing staff will not be able to assist you with questions regarding bills from these companies. ° °You will be contacted with the lab results as soon as they are available. The fastest way to get your results is to activate your My Chart account. Instructions are located on the last page of this paperwork. If you have not heard from us regarding the results in 2 weeks, please contact this office. °  ° ° ° °

## 2019-05-07 ENCOUNTER — Ambulatory Visit (INDEPENDENT_AMBULATORY_CARE_PROVIDER_SITE_OTHER): Payer: BC Managed Care – PPO | Admitting: Endocrinology

## 2019-05-07 ENCOUNTER — Encounter: Payer: Self-pay | Admitting: Endocrinology

## 2019-05-07 ENCOUNTER — Other Ambulatory Visit: Payer: Self-pay

## 2019-05-07 VITALS — BP 158/72 | HR 85 | Temp 98.3°F | Wt 257.8 lb

## 2019-05-07 DIAGNOSIS — E1142 Type 2 diabetes mellitus with diabetic polyneuropathy: Secondary | ICD-10-CM

## 2019-05-07 DIAGNOSIS — Z794 Long term (current) use of insulin: Secondary | ICD-10-CM | POA: Diagnosis not present

## 2019-05-07 DIAGNOSIS — Z23 Encounter for immunization: Secondary | ICD-10-CM | POA: Diagnosis not present

## 2019-05-07 LAB — POCT GLYCOSYLATED HEMOGLOBIN (HGB A1C): Hemoglobin A1C: 7.9 % — AB (ref 4.0–5.6)

## 2019-05-07 MED ORDER — OZEMPIC (1 MG/DOSE) 2 MG/1.5ML ~~LOC~~ SOPN: 1.0000 mg | PEN_INJECTOR | SUBCUTANEOUS | 3 refills | Status: DC

## 2019-05-07 MED ORDER — FREESTYLE LIBRE 14 DAY SENSOR MISC: 1.0000 | 3 refills | Status: DC

## 2019-05-07 MED ORDER — V-GO 30 KIT: 1.0000 | PACK | Freq: Every day | 3 refills | Status: DC

## 2019-05-07 NOTE — Patient Instructions (Addendum)
Your blood pressure is high today.  Please see your primary care provider soon, to have it rechecked.   I have sent a prescription to [facility], to increase the Ozempic.  check your blood sugar twice a day.  vary the time of day when you check, between before the 3 meals, and at bedtime.  also check if you have symptoms of your blood sugar being too high or too low.  please keep a record of the readings and bring it to your next appointment here (or you can bring the meter itself).  You can write it on any piece of paper.  please call us sooner if your blood sugar goes below 70, or if you have a lot of readings over 200. I have sent a prescription to [facility], to change to a V-GO-30.   Please continue the same V-GO clicks.  Please come back for a follow-up appointment in 2-3 months.
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
## 2019-05-08 ENCOUNTER — Encounter: Payer: Self-pay | Admitting: Endocrinology

## 2019-05-11 NOTE — Telephone Encounter (Signed)
Please advise how you wish to proceed given pt reluctance to pay out of pocket

## 2019-05-11 NOTE — Telephone Encounter (Signed)
Please refer to pt response 

## 2019-05-12 ENCOUNTER — Other Ambulatory Visit: Payer: Self-pay | Admitting: Endocrinology

## 2019-05-12 ENCOUNTER — Telehealth: Payer: Self-pay | Admitting: Endocrinology

## 2019-05-12 NOTE — Telephone Encounter (Signed)
FYI

## 2019-05-12 NOTE — Telephone Encounter (Signed)
Returned call and spoke with Gerald Stabs. Provided information re: dx, type of VGo being ordered, is pt currently taking insulin and how often pt is checking her CBG's. States she will continue with processing order request.

## 2019-05-12 NOTE — Telephone Encounter (Signed)
[facility] has called stating that they received the information for the patients V-Go. But are needing to know what type of diabetic she is and how much she test a day.  Please Advise, Thanks  Ph # [phone]

## 2019-05-13 DIAGNOSIS — E119 Type 2 diabetes mellitus without complications: Secondary | ICD-10-CM | POA: Diagnosis not present

## 2019-05-13 NOTE — Telephone Encounter (Signed)
Received notification from [facility] requesting Dr. Loanne Drilling complete 3 page order.  Company: [facility]  Document: Rx order Other records requested: N/A  All above requested information has been faxed successfully to [employer] [facility] listed above. Documents and fax confirmation have been placed in the faxed file for future reference.

## 2019-05-15 DIAGNOSIS — H25011 Cortical age-related cataract, right eye: Secondary | ICD-10-CM | POA: Diagnosis not present

## 2019-05-18 DIAGNOSIS — H4311 Vitreous hemorrhage, right eye: Secondary | ICD-10-CM | POA: Diagnosis not present

## 2019-05-18 DIAGNOSIS — E113591 Type 2 diabetes mellitus with proliferative diabetic retinopathy without macular edema, right eye: Secondary | ICD-10-CM | POA: Diagnosis not present

## 2019-05-18 DIAGNOSIS — E113552 Type 2 diabetes mellitus with stable proliferative diabetic retinopathy, left eye: Secondary | ICD-10-CM | POA: Diagnosis not present

## 2019-05-29 ENCOUNTER — Other Ambulatory Visit: Payer: Self-pay | Admitting: Family Medicine

## 2019-05-29 ENCOUNTER — Other Ambulatory Visit: Payer: Self-pay | Admitting: Endocrinology

## 2019-05-29 DIAGNOSIS — Z794 Long term (current) use of insulin: Secondary | ICD-10-CM

## 2019-05-29 DIAGNOSIS — E1142 Type 2 diabetes mellitus with diabetic polyneuropathy: Secondary | ICD-10-CM

## 2019-05-29 NOTE — Telephone Encounter (Signed)
Requested medication (s) are due for refill today: yes  Requested medication (s) are on the active medication list: yes  Last refill:  05/05/2019  Future visit scheduled: yes  Notes to clinic: requesting 90 day supply   Requested Prescriptions  Pending Prescriptions Disp Refills   buPROPion (WELLBUTRIN) 100 MG tablet [Pharmacy Med Name: BUPROPION HCL 100 MG TABLET] 180 tablet 1    Sig: TAKE 1 TABLET BY MOUTH TWICE A DAY     Psychiatry: Antidepressants - bupropion Failed - 05/29/2019  1:31 PM      Failed - Last BP in normal range    BP Readings from Last 1 Encounters:  05/07/19 (!) 158/72         Failed - Completed PHQ-2 or PHQ-9 in the last 360 days.      Passed - Valid encounter within last 6 months    Recent Outpatient Visits          3 weeks ago Adjustment disorder with anxious mood   [facility], Lilia Argue, MD   1 year ago Acute lower respiratory tract infection   [facility], Lilia Argue, MD   1 year ago Wound check, abscess   [facility], Lilia Argue, MD   1 year ago Lymphadenopathy, axillary   [facility], Laurey Arrow, MD   1 year ago Nevus   [facility], Laurey Arrow, MD      Future Appointments            In 2 weeks Rutherford Guys, MD [facility] at [place], [facility]

## 2019-06-10 DIAGNOSIS — E11610 Type 2 diabetes mellitus with diabetic neuropathic arthropathy: Secondary | ICD-10-CM | POA: Diagnosis not present

## 2019-06-10 DIAGNOSIS — I10 Essential (primary) hypertension: Secondary | ICD-10-CM | POA: Diagnosis not present

## 2019-06-10 DIAGNOSIS — Z8631 Personal history of diabetic foot ulcer: Secondary | ICD-10-CM | POA: Diagnosis not present

## 2019-06-16 ENCOUNTER — Ambulatory Visit (INDEPENDENT_AMBULATORY_CARE_PROVIDER_SITE_OTHER): Payer: BC Managed Care – PPO | Admitting: Family Medicine

## 2019-06-16 ENCOUNTER — Encounter: Payer: Self-pay | Admitting: Family Medicine

## 2019-06-16 ENCOUNTER — Other Ambulatory Visit: Payer: Self-pay

## 2019-06-16 VITALS — BP 127/82 | HR 78 | Temp 98.2°F | Ht 68.0 in | Wt 260.0 lb

## 2019-06-16 DIAGNOSIS — Z9622 Myringotomy tube(s) status: Secondary | ICD-10-CM

## 2019-06-16 DIAGNOSIS — F4322 Adjustment disorder with anxiety: Secondary | ICD-10-CM

## 2019-06-16 MED ORDER — BUPROPION HCL ER (XL) 300 MG PO TB24: 300.0000 mg | ORAL_TABLET | Freq: Every day | ORAL | 1 refills | Status: DC

## 2019-06-16 NOTE — Patient Instructions (Signed)
° ° ° °  If you have lab work done today you will be contacted with your lab results within the next 2 weeks.  If you have not heard from us then please contact us. The fastest way to get your results is to register for My Chart. ° ° °IF you received an x-ray today, you will receive an invoice from [facility]. Please contact [facility] at [phone] with questions or concerns regarding your invoice.  ° °IF you received labwork today, you will receive an invoice from [facility]. Please contact [facility] at [phone] with questions or concerns regarding your invoice.  ° °Our billing staff will not be able to assist you with questions regarding bills from these companies. ° °You will be contacted with the lab results as soon as they are available. The fastest way to get your results is to activate your My Chart account. Instructions are located on the last page of this paperwork. If you have not heard from us regarding the results in 2 weeks, please contact this office. °  ° ° ° °
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
## 2019-06-17 DIAGNOSIS — H2511 Age-related nuclear cataract, right eye: Secondary | ICD-10-CM | POA: Diagnosis not present

## 2019-06-17 DIAGNOSIS — H25811 Combined forms of age-related cataract, right eye: Secondary | ICD-10-CM | POA: Diagnosis not present

## 2019-06-22 ENCOUNTER — Telehealth: Payer: Self-pay

## 2019-06-22 ENCOUNTER — Emergency Department (HOSPITAL_COMMUNITY)
Admission: EM | Admit: 2019-06-22 | Discharge: 2019-06-22 | Disposition: A | Discharge status: Home / Self Care | Payer: BC Managed Care – PPO | Attending: Emergency Medicine | Admitting: Emergency Medicine

## 2019-06-22 ENCOUNTER — Other Ambulatory Visit: Payer: Self-pay

## 2019-06-22 ENCOUNTER — Other Ambulatory Visit: Payer: Self-pay | Admitting: Endocrinology

## 2019-06-22 DIAGNOSIS — R0602 Shortness of breath: Secondary | ICD-10-CM | POA: Diagnosis not present

## 2019-06-22 DIAGNOSIS — R4781 Slurred speech: Secondary | ICD-10-CM | POA: Diagnosis not present

## 2019-06-22 DIAGNOSIS — R1013 Epigastric pain: Secondary | ICD-10-CM | POA: Diagnosis not present

## 2019-06-22 DIAGNOSIS — Z5321 Procedure and treatment not carried out due to patient leaving prior to being seen by health care provider: Secondary | ICD-10-CM | POA: Diagnosis not present

## 2019-06-22 DIAGNOSIS — R42 Dizziness and giddiness: Secondary | ICD-10-CM | POA: Diagnosis not present

## 2019-06-22 DIAGNOSIS — R29810 Facial weakness: Secondary | ICD-10-CM | POA: Diagnosis not present

## 2019-06-22 DIAGNOSIS — E119 Type 2 diabetes mellitus without complications: Secondary | ICD-10-CM | POA: Diagnosis not present

## 2019-06-22 LAB — I-STAT BETA HCG BLOOD, ED (MC, WL, AP ONLY): I-stat hCG, quantitative: <5 m[IU]/mL (ref <5)

## 2019-06-22 LAB — LIPASE, BLOOD: Lipase: 27 U/L (ref 11–51)

## 2019-06-22 LAB — COMPREHENSIVE METABOLIC PANEL
ALT: 20 U/L (ref 0–44)
AST: 22 U/L (ref 15–41)
Albumin: 4.3 g/dL (ref 3.5–5.0)
Alkaline Phosphatase: 135 U/L — ABNORMAL HIGH (ref 38–126)
Anion gap: 15 (ref 5–15)
BUN: 11 mg/dL (ref 6–20)
CO2: 21 mmol/L — ABNORMAL LOW (ref 22–32)
Calcium: 10 mg/dL (ref 8.9–10.3)
Chloride: 104 mmol/L (ref 98–111)
Creatinine, Ser: 0.98 mg/dL (ref 0.44–1.00)
GFR calc Af Amer: >60 mL/min (ref >60)
GFR calc non Af Amer: >60 mL/min (ref >60)
Glucose, Bld: 177 mg/dL — ABNORMAL HIGH (ref 70–99)
Potassium: 4.3 mmol/L (ref 3.5–5.1)
Sodium: 140 mmol/L (ref 135–145)
Total Bilirubin: 0.6 mg/dL (ref 0.3–1.2)
Total Protein: 7.9 g/dL (ref 6.5–8.1)

## 2019-06-22 LAB — CBC
HCT: 43.3 % (ref 36.0–46.0)
Hemoglobin: 13.6 g/dL (ref 12.0–15.0)
MCH: 27.3 pg (ref 26.0–34.0)
MCHC: 31.4 g/dL (ref 30.0–36.0)
MCV: 86.9 fL (ref 80.0–100.0)
Platelets: 361 10*3/uL (ref 150–400)
RBC: 4.98 MIL/uL (ref 3.87–5.11)
RDW: 13.9 % (ref 11.5–15.5)
WBC: 10.2 10*3/uL (ref 4.0–10.5)
nRBC: 0 % (ref 0.0–0.2)

## 2019-06-22 LAB — TROPONIN I (HIGH SENSITIVITY): Troponin I (High Sensitivity): 5 ng/L (ref <18)

## 2019-06-22 MED ORDER — SODIUM CHLORIDE 0.9% FLUSH: 3.0000 mL | Freq: Once | INTRAVENOUS | Status: DC

## 2019-06-22 NOTE — Telephone Encounter (Signed)
Company: [employer]  Document: Doctor Order Other records requested: None  All above requested information has been faxed successfully to [employer] listed above. Documents and fax confirmation have been placed in the faxed file for future reference.

## 2019-06-22 NOTE — ED Triage Notes (Signed)
Patient in via [facility] from work c/o severe epigastric/upper abdominal pain x 2 days, worse today along with episode of shortness of breath and dizziness upon getting out of the shower this morning. Patient endorses hx anxiety and states it could have been an anxiety attack because it had quickly improved and she hasn't taken her Wellbutrin all weekend (states she was put on it a month ago for anxiety). Patient has hx gastric bypass in 2016. Denies N/V/D, chest pain. Resp e/u, skin w/d. Reports pain has improved some since this morning, but she remains tearful.

## 2019-06-23 DIAGNOSIS — H4311 Vitreous hemorrhage, right eye: Secondary | ICD-10-CM | POA: Diagnosis not present

## 2019-06-23 DIAGNOSIS — E113511 Type 2 diabetes mellitus with proliferative diabetic retinopathy with macular edema, right eye: Secondary | ICD-10-CM | POA: Diagnosis not present

## 2019-06-24 DIAGNOSIS — E113591 Type 2 diabetes mellitus with proliferative diabetic retinopathy without macular edema, right eye: Secondary | ICD-10-CM | POA: Diagnosis not present

## 2019-06-24 DIAGNOSIS — H4311 Vitreous hemorrhage, right eye: Secondary | ICD-10-CM | POA: Diagnosis not present

## 2019-06-26 ENCOUNTER — Other Ambulatory Visit: Payer: Self-pay | Admitting: Endocrinology

## 2019-06-26 DIAGNOSIS — Z794 Long term (current) use of insulin: Secondary | ICD-10-CM

## 2019-06-26 DIAGNOSIS — E1142 Type 2 diabetes mellitus with diabetic polyneuropathy: Secondary | ICD-10-CM

## 2019-07-01 DIAGNOSIS — R1013 Epigastric pain: Secondary | ICD-10-CM | POA: Insufficient documentation

## 2019-07-01 DIAGNOSIS — E11621 Type 2 diabetes mellitus with foot ulcer: Secondary | ICD-10-CM | POA: Diagnosis not present

## 2019-07-01 DIAGNOSIS — I10 Essential (primary) hypertension: Secondary | ICD-10-CM | POA: Diagnosis not present

## 2019-07-01 DIAGNOSIS — E1140 Type 2 diabetes mellitus with diabetic neuropathy, unspecified: Secondary | ICD-10-CM | POA: Diagnosis not present

## 2019-07-01 DIAGNOSIS — E11610 Type 2 diabetes mellitus with diabetic neuropathic arthropathy: Secondary | ICD-10-CM | POA: Diagnosis not present

## 2019-07-01 DIAGNOSIS — M869 Osteomyelitis, unspecified: Secondary | ICD-10-CM | POA: Diagnosis not present

## 2019-07-01 DIAGNOSIS — L97509 Non-pressure chronic ulcer of other part of unspecified foot with unspecified severity: Secondary | ICD-10-CM | POA: Diagnosis not present

## 2019-07-01 DIAGNOSIS — E1169 Type 2 diabetes mellitus with other specified complication: Secondary | ICD-10-CM | POA: Diagnosis not present

## 2019-07-08 DIAGNOSIS — E113591 Type 2 diabetes mellitus with proliferative diabetic retinopathy without macular edema, right eye: Secondary | ICD-10-CM | POA: Diagnosis not present

## 2019-07-08 DIAGNOSIS — E113552 Type 2 diabetes mellitus with stable proliferative diabetic retinopathy, left eye: Secondary | ICD-10-CM | POA: Diagnosis not present

## 2019-07-09 ENCOUNTER — Ambulatory Visit (INDEPENDENT_AMBULATORY_CARE_PROVIDER_SITE_OTHER): Payer: BC Managed Care – PPO | Admitting: Endocrinology

## 2019-07-09 ENCOUNTER — Encounter: Payer: Self-pay | Admitting: Endocrinology

## 2019-07-09 ENCOUNTER — Other Ambulatory Visit: Payer: Self-pay

## 2019-07-09 VITALS — BP 110/70 | HR 79 | Ht 68.0 in | Wt 253.6 lb

## 2019-07-09 DIAGNOSIS — E1142 Type 2 diabetes mellitus with diabetic polyneuropathy: Secondary | ICD-10-CM

## 2019-07-09 DIAGNOSIS — Z794 Long term (current) use of insulin: Secondary | ICD-10-CM

## 2019-07-09 NOTE — Patient Instructions (Addendum)
When you run low on V-GO pumps, please call so we can reduce to the V-GO-20.  Then you will prob need to take 3 times a day (just before each meal), [id number] clicks.  check your blood sugar twice a day.  vary the time of day when you check, between before the 3 meals, and at bedtime.  also check if you have symptoms of your blood sugar being too high or too low.  please keep a record of the readings and bring it to your next appointment here (or you can bring the meter itself).  You can write it on any piece of paper.  please call us sooner if your blood sugar goes below 70, or if you have a lot of readings over 200.  Please come back for a follow-up appointment in January.
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
## 2019-07-21 DIAGNOSIS — L820 Inflamed seborrheic keratosis: Secondary | ICD-10-CM | POA: Diagnosis not present

## 2019-07-21 DIAGNOSIS — D1801 Hemangioma of skin and subcutaneous tissue: Secondary | ICD-10-CM | POA: Diagnosis not present

## 2019-07-21 DIAGNOSIS — D225 Melanocytic nevi of trunk: Secondary | ICD-10-CM | POA: Diagnosis not present

## 2019-07-25 ENCOUNTER — Other Ambulatory Visit: Payer: Self-pay | Admitting: Endocrinology

## 2019-07-25 DIAGNOSIS — E1142 Type 2 diabetes mellitus with diabetic polyneuropathy: Secondary | ICD-10-CM

## 2019-07-25 DIAGNOSIS — Z794 Long term (current) use of insulin: Secondary | ICD-10-CM

## 2019-07-29 DIAGNOSIS — M869 Osteomyelitis, unspecified: Secondary | ICD-10-CM | POA: Diagnosis not present

## 2019-07-29 DIAGNOSIS — E11610 Type 2 diabetes mellitus with diabetic neuropathic arthropathy: Secondary | ICD-10-CM | POA: Diagnosis not present

## 2019-07-29 DIAGNOSIS — Z8631 Personal history of diabetic foot ulcer: Secondary | ICD-10-CM | POA: Diagnosis not present

## 2019-07-29 DIAGNOSIS — I10 Essential (primary) hypertension: Secondary | ICD-10-CM | POA: Diagnosis not present

## 2019-07-29 DIAGNOSIS — L97509 Non-pressure chronic ulcer of other part of unspecified foot with unspecified severity: Secondary | ICD-10-CM | POA: Diagnosis not present

## 2019-07-29 DIAGNOSIS — E1169 Type 2 diabetes mellitus with other specified complication: Secondary | ICD-10-CM | POA: Diagnosis not present

## 2019-07-29 DIAGNOSIS — E11621 Type 2 diabetes mellitus with foot ulcer: Secondary | ICD-10-CM | POA: Diagnosis not present

## 2019-08-07 DIAGNOSIS — H9221 Otorrhagia, right ear: Secondary | ICD-10-CM | POA: Diagnosis not present

## 2019-08-10 DIAGNOSIS — E119 Type 2 diabetes mellitus without complications: Secondary | ICD-10-CM | POA: Diagnosis not present

## 2019-08-18 ENCOUNTER — Other Ambulatory Visit: Payer: Self-pay | Admitting: Endocrinology

## 2019-08-18 DIAGNOSIS — E1142 Type 2 diabetes mellitus with diabetic polyneuropathy: Secondary | ICD-10-CM

## 2019-08-18 DIAGNOSIS — Z794 Long term (current) use of insulin: Secondary | ICD-10-CM

## 2019-09-02 DIAGNOSIS — E1140 Type 2 diabetes mellitus with diabetic neuropathy, unspecified: Secondary | ICD-10-CM | POA: Diagnosis not present

## 2019-09-02 DIAGNOSIS — E1169 Type 2 diabetes mellitus with other specified complication: Secondary | ICD-10-CM | POA: Diagnosis not present

## 2019-09-02 DIAGNOSIS — M869 Osteomyelitis, unspecified: Secondary | ICD-10-CM | POA: Diagnosis not present

## 2019-09-02 DIAGNOSIS — E11610 Type 2 diabetes mellitus with diabetic neuropathic arthropathy: Secondary | ICD-10-CM | POA: Diagnosis not present

## 2019-09-02 DIAGNOSIS — L97509 Non-pressure chronic ulcer of other part of unspecified foot with unspecified severity: Secondary | ICD-10-CM | POA: Diagnosis not present

## 2019-09-02 DIAGNOSIS — E11621 Type 2 diabetes mellitus with foot ulcer: Secondary | ICD-10-CM | POA: Diagnosis not present

## 2019-09-02 DIAGNOSIS — I10 Essential (primary) hypertension: Secondary | ICD-10-CM | POA: Diagnosis not present

## 2019-09-03 DIAGNOSIS — M25551 Pain in right hip: Secondary | ICD-10-CM | POA: Diagnosis not present

## 2019-09-03 DIAGNOSIS — Z8631 Personal history of diabetic foot ulcer: Secondary | ICD-10-CM | POA: Diagnosis not present

## 2019-09-03 DIAGNOSIS — I10 Essential (primary) hypertension: Secondary | ICD-10-CM | POA: Diagnosis not present

## 2019-09-03 DIAGNOSIS — E1149 Type 2 diabetes mellitus with other diabetic neurological complication: Secondary | ICD-10-CM | POA: Diagnosis not present

## 2019-09-03 DIAGNOSIS — E1165 Type 2 diabetes mellitus with hyperglycemia: Secondary | ICD-10-CM | POA: Diagnosis not present

## 2019-09-03 DIAGNOSIS — E11610 Type 2 diabetes mellitus with diabetic neuropathic arthropathy: Secondary | ICD-10-CM | POA: Diagnosis not present

## 2019-09-07 ENCOUNTER — Other Ambulatory Visit: Payer: Self-pay

## 2019-09-09 ENCOUNTER — Ambulatory Visit: Payer: BC Managed Care – PPO | Admitting: Endocrinology

## 2019-09-09 ENCOUNTER — Encounter: Payer: Self-pay | Admitting: Endocrinology

## 2019-09-09 VITALS — BP 108/70 | HR 100 | Ht 68.0 in | Wt 251.0 lb

## 2019-09-09 DIAGNOSIS — Z794 Long term (current) use of insulin: Secondary | ICD-10-CM | POA: Diagnosis not present

## 2019-09-09 DIAGNOSIS — E1142 Type 2 diabetes mellitus with diabetic polyneuropathy: Secondary | ICD-10-CM

## 2019-09-09 LAB — POCT GLYCOSYLATED HEMOGLOBIN (HGB A1C): Hemoglobin A1C: 7.9 % — AB (ref 4.0–5.6)

## 2019-09-09 MED ORDER — FARXIGA 5 MG PO TABS: 5.0000 mg | ORAL_TABLET | Freq: Every day | ORAL | 2 refills | Status: DC

## 2019-09-09 MED ORDER — OZEMPIC (1 MG/DOSE) 2 MG/1.5ML ~~LOC~~ SOPN: 1.0000 mg | PEN_INJECTOR | SUBCUTANEOUS | 3 refills | Status: DC
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
## 2019-09-09 NOTE — Patient Instructions (Addendum)
I have sent a prescription to your pharmacy, to add "Farxiga." check your blood sugar twice a day.  vary the time of day when you check, between before the 3 meals, and at bedtime.  also check if you have symptoms of your blood sugar being too high or too low.  please keep a record of the readings and bring it to your next appointment here (or you can bring the meter itself).  You can write it on any piece of paper.  please call us sooner if your blood sugar goes below 70, or if you have a lot of readings over 200.  Please let me know if you nwant a prescription for the Dexcom continuous glucose monitor.  Please come back for a follow-up appointment in 2-3 months.

## 2019-09-10 ENCOUNTER — Other Ambulatory Visit: Payer: Self-pay | Admitting: Endocrinology

## 2019-09-10 DIAGNOSIS — Z794 Long term (current) use of insulin: Secondary | ICD-10-CM

## 2019-09-10 DIAGNOSIS — E1142 Type 2 diabetes mellitus with diabetic polyneuropathy: Secondary | ICD-10-CM

## 2019-09-11 DIAGNOSIS — Z5181 Encounter for therapeutic drug level monitoring: Secondary | ICD-10-CM | POA: Diagnosis not present

## 2019-09-11 DIAGNOSIS — M25511 Pain in right shoulder: Secondary | ICD-10-CM | POA: Diagnosis not present

## 2019-09-11 DIAGNOSIS — M79672 Pain in left foot: Secondary | ICD-10-CM | POA: Diagnosis not present

## 2019-09-11 DIAGNOSIS — E11610 Type 2 diabetes mellitus with diabetic neuropathic arthropathy: Secondary | ICD-10-CM | POA: Diagnosis not present

## 2019-09-11 DIAGNOSIS — G894 Chronic pain syndrome: Secondary | ICD-10-CM | POA: Diagnosis not present

## 2019-09-11 DIAGNOSIS — Z79899 Other long term (current) drug therapy: Secondary | ICD-10-CM | POA: Diagnosis not present

## 2019-09-11 DIAGNOSIS — I10 Essential (primary) hypertension: Secondary | ICD-10-CM | POA: Diagnosis not present

## 2019-09-11 DIAGNOSIS — G8929 Other chronic pain: Secondary | ICD-10-CM | POA: Diagnosis not present

## 2019-09-14 DIAGNOSIS — G894 Chronic pain syndrome: Secondary | ICD-10-CM | POA: Insufficient documentation

## 2019-09-15 ENCOUNTER — Encounter: Payer: Self-pay | Admitting: Family Medicine

## 2019-09-15 ENCOUNTER — Other Ambulatory Visit: Payer: Self-pay

## 2019-09-15 ENCOUNTER — Ambulatory Visit (INDEPENDENT_AMBULATORY_CARE_PROVIDER_SITE_OTHER): Payer: BC Managed Care – PPO | Admitting: Family Medicine

## 2019-09-15 VITALS — BP 138/79 | HR 79 | Temp 98.0°F | Ht 68.0 in | Wt 248.6 lb

## 2019-09-15 DIAGNOSIS — E11610 Type 2 diabetes mellitus with diabetic neuropathic arthropathy: Secondary | ICD-10-CM

## 2019-09-15 DIAGNOSIS — F4322 Adjustment disorder with anxiety: Secondary | ICD-10-CM | POA: Diagnosis not present

## 2019-09-15 DIAGNOSIS — G894 Chronic pain syndrome: Secondary | ICD-10-CM

## 2019-09-15 MED ORDER — BUPROPION HCL ER (XL) 300 MG PO TB24: 300.0000 mg | ORAL_TABLET | Freq: Every day | ORAL | 1 refills | Status: DC
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
## 2019-09-16 ENCOUNTER — Ambulatory Visit: Payer: BC Managed Care – PPO | Admitting: Family Medicine

## 2019-09-18 DIAGNOSIS — Z9884 Bariatric surgery status: Secondary | ICD-10-CM | POA: Diagnosis not present

## 2019-09-18 DIAGNOSIS — E1142 Type 2 diabetes mellitus with diabetic polyneuropathy: Secondary | ICD-10-CM | POA: Diagnosis not present

## 2019-09-18 DIAGNOSIS — G6289 Other specified polyneuropathies: Secondary | ICD-10-CM | POA: Diagnosis not present

## 2019-09-18 DIAGNOSIS — Z6837 Body mass index (BMI) 37.0-37.9, adult: Secondary | ICD-10-CM | POA: Diagnosis not present

## 2019-09-18 DIAGNOSIS — G894 Chronic pain syndrome: Secondary | ICD-10-CM | POA: Diagnosis not present

## 2019-09-18 DIAGNOSIS — F419 Anxiety disorder, unspecified: Secondary | ICD-10-CM | POA: Diagnosis not present

## 2019-09-18 DIAGNOSIS — I10 Essential (primary) hypertension: Secondary | ICD-10-CM | POA: Diagnosis not present

## 2019-09-18 DIAGNOSIS — E785 Hyperlipidemia, unspecified: Secondary | ICD-10-CM | POA: Diagnosis not present

## 2019-09-18 DIAGNOSIS — Z833 Family history of diabetes mellitus: Secondary | ICD-10-CM | POA: Diagnosis not present

## 2019-09-18 DIAGNOSIS — G5690 Unspecified mononeuropathy of unspecified upper limb: Secondary | ICD-10-CM | POA: Diagnosis not present

## 2019-09-18 DIAGNOSIS — E11610 Type 2 diabetes mellitus with diabetic neuropathic arthropathy: Secondary | ICD-10-CM | POA: Diagnosis not present

## 2019-09-25 DIAGNOSIS — E119 Type 2 diabetes mellitus without complications: Secondary | ICD-10-CM | POA: Diagnosis not present

## 2019-10-01 DIAGNOSIS — G894 Chronic pain syndrome: Secondary | ICD-10-CM | POA: Diagnosis not present

## 2019-10-01 DIAGNOSIS — G8929 Other chronic pain: Secondary | ICD-10-CM | POA: Diagnosis not present

## 2019-10-01 DIAGNOSIS — M79672 Pain in left foot: Secondary | ICD-10-CM | POA: Diagnosis not present

## 2019-10-01 DIAGNOSIS — I10 Essential (primary) hypertension: Secondary | ICD-10-CM | POA: Diagnosis not present

## 2019-10-02 DIAGNOSIS — Z9622 Myringotomy tube(s) status: Secondary | ICD-10-CM | POA: Diagnosis not present

## 2019-10-14 ENCOUNTER — Encounter: Payer: Self-pay | Admitting: Endocrinology

## 2019-10-14 ENCOUNTER — Other Ambulatory Visit: Payer: Self-pay

## 2019-10-14 DIAGNOSIS — E1142 Type 2 diabetes mellitus with diabetic polyneuropathy: Secondary | ICD-10-CM

## 2019-10-14 DIAGNOSIS — Z794 Long term (current) use of insulin: Secondary | ICD-10-CM

## 2019-10-14 MED ORDER — DEXCOM G6 RECEIVER DEVI: 1.0000 | 0 refills | Status: DC

## 2019-10-14 MED ORDER — DEXCOM G6 TRANSMITTER MISC: 1.0000 | 3 refills | Status: DC

## 2019-10-14 MED ORDER — DEXCOM G6 SENSOR MISC: 1.0000 | 2 refills | Status: DC

## 2019-10-14 NOTE — Telephone Encounter (Signed)
Please advise if you wish to change Rx's. If so, I will submit Rx's to [facility] so the PA process can begin

## 2019-10-19 ENCOUNTER — Other Ambulatory Visit: Payer: Self-pay

## 2019-10-19 DIAGNOSIS — E1142 Type 2 diabetes mellitus with diabetic polyneuropathy: Secondary | ICD-10-CM

## 2019-10-19 DIAGNOSIS — Z794 Long term (current) use of insulin: Secondary | ICD-10-CM

## 2019-10-19 MED ORDER — DEXCOM G6 RECEIVER DEVI: 1.0000 | 0 refills | Status: DC

## 2019-10-19 MED ORDER — DEXCOM G6 SENSOR MISC: 1.0000 | 2 refills | Status: DC

## 2019-10-19 MED ORDER — DEXCOM G6 TRANSMITTER MISC: 1.0000 | 3 refills | Status: DC

## 2019-11-05 ENCOUNTER — Other Ambulatory Visit: Payer: Self-pay | Admitting: Endocrinology

## 2019-11-05 DIAGNOSIS — Z961 Presence of intraocular lens: Secondary | ICD-10-CM | POA: Diagnosis not present

## 2019-11-05 DIAGNOSIS — H3563 Retinal hemorrhage, bilateral: Secondary | ICD-10-CM | POA: Diagnosis not present

## 2019-11-05 DIAGNOSIS — E113553 Type 2 diabetes mellitus with stable proliferative diabetic retinopathy, bilateral: Secondary | ICD-10-CM | POA: Diagnosis not present

## 2019-11-05 DIAGNOSIS — Z9889 Other specified postprocedural states: Secondary | ICD-10-CM | POA: Diagnosis not present

## 2019-11-05 LAB — HM DIABETES EYE EXAM

## 2019-11-10 ENCOUNTER — Encounter: Payer: Self-pay | Admitting: Endocrinology

## 2019-11-12 DIAGNOSIS — E119 Type 2 diabetes mellitus without complications: Secondary | ICD-10-CM | POA: Diagnosis not present

## 2019-11-12 DIAGNOSIS — G8929 Other chronic pain: Secondary | ICD-10-CM | POA: Diagnosis not present

## 2019-11-12 DIAGNOSIS — G894 Chronic pain syndrome: Secondary | ICD-10-CM | POA: Diagnosis not present

## 2019-11-12 DIAGNOSIS — M79672 Pain in left foot: Secondary | ICD-10-CM | POA: Diagnosis not present

## 2019-11-12 DIAGNOSIS — E11610 Type 2 diabetes mellitus with diabetic neuropathic arthropathy: Secondary | ICD-10-CM | POA: Diagnosis not present

## 2019-12-02 DIAGNOSIS — L97509 Non-pressure chronic ulcer of other part of unspecified foot with unspecified severity: Secondary | ICD-10-CM | POA: Diagnosis not present

## 2019-12-02 DIAGNOSIS — E1169 Type 2 diabetes mellitus with other specified complication: Secondary | ICD-10-CM | POA: Diagnosis not present

## 2019-12-02 DIAGNOSIS — E11621 Type 2 diabetes mellitus with foot ulcer: Secondary | ICD-10-CM | POA: Diagnosis not present

## 2019-12-02 DIAGNOSIS — M869 Osteomyelitis, unspecified: Secondary | ICD-10-CM | POA: Diagnosis not present

## 2019-12-02 DIAGNOSIS — E1140 Type 2 diabetes mellitus with diabetic neuropathy, unspecified: Secondary | ICD-10-CM | POA: Diagnosis not present

## 2019-12-02 DIAGNOSIS — E11610 Type 2 diabetes mellitus with diabetic neuropathic arthropathy: Secondary | ICD-10-CM | POA: Diagnosis not present

## 2019-12-02 DIAGNOSIS — I10 Essential (primary) hypertension: Secondary | ICD-10-CM | POA: Diagnosis not present

## 2019-12-07 ENCOUNTER — Other Ambulatory Visit: Payer: Self-pay

## 2019-12-08 ENCOUNTER — Other Ambulatory Visit: Payer: Self-pay

## 2019-12-08 ENCOUNTER — Ambulatory Visit (INDEPENDENT_AMBULATORY_CARE_PROVIDER_SITE_OTHER): Payer: BC Managed Care – PPO

## 2019-12-08 ENCOUNTER — Ambulatory Visit (HOSPITAL_COMMUNITY)
Admission: EM | Admit: 2019-12-08 | Discharge: 2019-12-08 | Disposition: A | Discharge status: Home / Self Care | Payer: BC Managed Care – PPO | Attending: Family Medicine | Admitting: Family Medicine

## 2019-12-08 ENCOUNTER — Encounter (HOSPITAL_COMMUNITY): Payer: Self-pay

## 2019-12-08 DIAGNOSIS — M7989 Other specified soft tissue disorders: Secondary | ICD-10-CM | POA: Diagnosis not present

## 2019-12-08 DIAGNOSIS — M25561 Pain in right knee: Secondary | ICD-10-CM

## 2019-12-08 MED ORDER — MELOXICAM 7.5 MG PO TABS: 7.5000 mg | ORAL_TABLET | Freq: Every day | ORAL | 0 refills | Status: DC

## 2019-12-08 MED ORDER — KETOROLAC TROMETHAMINE 30 MG/ML IJ SOLN
INTRAMUSCULAR | Status: AC
  Filled 2019-12-08: qty 1

## 2019-12-08 MED ORDER — KETOROLAC TROMETHAMINE 30 MG/ML IJ SOLN
30.0000 mg | Freq: Once | INTRAMUSCULAR | Status: AC
  Administered 2019-12-08: 30 mg via INTRAMUSCULAR
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
## 2019-12-08 NOTE — Discharge Instructions (Addendum)
X ray normal This is most likely arthritis We will treat with meloxicam daily for the next week Rest, ice, elevate.  Ace wrap for compression.  Call orthopedics as needed.

## 2019-12-08 NOTE — ED Triage Notes (Signed)
Pt states he has pain in her knee pain and swelling x 3 days. Pt state she has been taking her pain meds for her left foot and it's not working.

## 2019-12-09 ENCOUNTER — Ambulatory Visit: Payer: BC Managed Care – PPO | Admitting: Endocrinology

## 2019-12-09 DIAGNOSIS — M25561 Pain in right knee: Secondary | ICD-10-CM | POA: Diagnosis not present

## 2019-12-11 ENCOUNTER — Other Ambulatory Visit: Payer: Self-pay

## 2019-12-14 ENCOUNTER — Ambulatory Visit: Payer: BC Managed Care – PPO | Admitting: Endocrinology

## 2019-12-15 ENCOUNTER — Ambulatory Visit (INDEPENDENT_AMBULATORY_CARE_PROVIDER_SITE_OTHER): Payer: BC Managed Care – PPO | Admitting: Endocrinology

## 2019-12-15 ENCOUNTER — Encounter: Payer: Self-pay | Admitting: Endocrinology

## 2019-12-15 ENCOUNTER — Other Ambulatory Visit: Payer: Self-pay

## 2019-12-15 VITALS — BP 128/80 | HR 90 | Ht 68.0 in | Wt 246.0 lb

## 2019-12-15 DIAGNOSIS — E1142 Type 2 diabetes mellitus with diabetic polyneuropathy: Secondary | ICD-10-CM

## 2019-12-15 DIAGNOSIS — Z794 Long term (current) use of insulin: Secondary | ICD-10-CM

## 2019-12-15 LAB — POCT GLYCOSYLATED HEMOGLOBIN (HGB A1C): Hemoglobin A1C: 7.2 % — AB (ref 4.0–5.6)

## 2019-12-15 NOTE — Patient Instructions (Addendum)
Please continue the same medications. check your blood sugar twice a day.  vary the time of day when you check, between before the 3 meals, and at bedtime.  also check if you have symptoms of your blood sugar being too high or too low.  please keep a record of the readings and bring it to your next appointment here (or you can bring the meter itself).  You can write it on any piece of paper.  please call us sooner if your blood sugar goes below 70, or if you have a lot of readings over 200.   Please come back for a follow-up appointment in 2-3 months.   
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
## 2019-12-25 DIAGNOSIS — E119 Type 2 diabetes mellitus without complications: Secondary | ICD-10-CM | POA: Diagnosis not present

## 2019-12-30 ENCOUNTER — Encounter (INDEPENDENT_AMBULATORY_CARE_PROVIDER_SITE_OTHER): Payer: Self-pay | Admitting: Ophthalmology

## 2019-12-31 DIAGNOSIS — F323 Major depressive disorder, single episode, severe with psychotic features: Secondary | ICD-10-CM | POA: Diagnosis not present

## 2020-01-24 DIAGNOSIS — E119 Type 2 diabetes mellitus without complications: Secondary | ICD-10-CM | POA: Diagnosis not present

## 2020-01-25 ENCOUNTER — Other Ambulatory Visit: Payer: Self-pay | Admitting: Endocrinology

## 2020-01-25 DIAGNOSIS — E1142 Type 2 diabetes mellitus with diabetic polyneuropathy: Secondary | ICD-10-CM

## 2020-01-25 DIAGNOSIS — Z794 Long term (current) use of insulin: Secondary | ICD-10-CM

## 2020-01-27 ENCOUNTER — Other Ambulatory Visit: Payer: Self-pay | Admitting: Endocrinology

## 2020-01-27 DIAGNOSIS — E1142 Type 2 diabetes mellitus with diabetic polyneuropathy: Secondary | ICD-10-CM

## 2020-01-27 DIAGNOSIS — Z794 Long term (current) use of insulin: Secondary | ICD-10-CM

## 2020-01-28 DIAGNOSIS — Z961 Presence of intraocular lens: Secondary | ICD-10-CM | POA: Diagnosis not present

## 2020-02-04 DIAGNOSIS — E11610 Type 2 diabetes mellitus with diabetic neuropathic arthropathy: Secondary | ICD-10-CM | POA: Diagnosis not present

## 2020-02-04 DIAGNOSIS — G894 Chronic pain syndrome: Secondary | ICD-10-CM | POA: Diagnosis not present

## 2020-02-04 DIAGNOSIS — M25511 Pain in right shoulder: Secondary | ICD-10-CM | POA: Diagnosis not present

## 2020-02-04 DIAGNOSIS — M79672 Pain in left foot: Secondary | ICD-10-CM | POA: Diagnosis not present

## 2020-02-09 DIAGNOSIS — I10 Essential (primary) hypertension: Secondary | ICD-10-CM | POA: Diagnosis not present

## 2020-02-09 DIAGNOSIS — E11610 Type 2 diabetes mellitus with diabetic neuropathic arthropathy: Secondary | ICD-10-CM | POA: Diagnosis not present

## 2020-02-18 ENCOUNTER — Ambulatory Visit
Admission: EM | Admit: 2020-02-18 | Discharge: 2020-02-18 | Disposition: A | Discharge status: Home / Self Care | Payer: BC Managed Care – PPO | Attending: Emergency Medicine | Admitting: Emergency Medicine

## 2020-02-18 ENCOUNTER — Other Ambulatory Visit: Payer: Self-pay

## 2020-02-18 ENCOUNTER — Ambulatory Visit (INDEPENDENT_AMBULATORY_CARE_PROVIDER_SITE_OTHER): Payer: BC Managed Care – PPO

## 2020-02-18 DIAGNOSIS — R0781 Pleurodynia: Secondary | ICD-10-CM

## 2020-02-18 NOTE — ED Triage Notes (Signed)
Pt states leaning over her soaking tub to clean it last Tuesday and felt a pop to lt rib. States pain on breathing and moving.
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
## 2020-02-18 NOTE — Discharge Instructions (Signed)
No evidence of fracture today, this is good news exhalation point May continue home pain medications and follow-up with prescribers if increase is needed. Go to ER for worsening pain, difficulty breathing, lightheadedness or dizziness.

## 2020-02-19 ENCOUNTER — Encounter: Payer: Self-pay | Admitting: Emergency Medicine

## 2020-03-01 ENCOUNTER — Other Ambulatory Visit: Payer: Self-pay

## 2020-03-01 DIAGNOSIS — Z794 Long term (current) use of insulin: Secondary | ICD-10-CM

## 2020-03-01 MED ORDER — OZEMPIC (1 MG/DOSE) 2 MG/1.5ML ~~LOC~~ SOPN: 1.0000 mg | PEN_INJECTOR | SUBCUTANEOUS | 0 refills | Status: DC

## 2020-03-02 ENCOUNTER — Other Ambulatory Visit: Payer: Self-pay

## 2020-03-02 DIAGNOSIS — Z794 Long term (current) use of insulin: Secondary | ICD-10-CM

## 2020-03-02 DIAGNOSIS — E1142 Type 2 diabetes mellitus with diabetic polyneuropathy: Secondary | ICD-10-CM

## 2020-03-02 MED ORDER — OZEMPIC (1 MG/DOSE) 4 MG/3ML ~~LOC~~ SOPN: 1.0000 mg | PEN_INJECTOR | SUBCUTANEOUS | 2 refills | Status: DC

## 2020-03-07 DIAGNOSIS — E119 Type 2 diabetes mellitus without complications: Secondary | ICD-10-CM | POA: Diagnosis not present

## 2020-03-14 ENCOUNTER — Telehealth (INDEPENDENT_AMBULATORY_CARE_PROVIDER_SITE_OTHER): Payer: BC Managed Care – PPO | Admitting: Family Medicine

## 2020-03-14 ENCOUNTER — Encounter: Payer: Self-pay | Admitting: Family Medicine

## 2020-03-14 ENCOUNTER — Other Ambulatory Visit: Payer: Self-pay

## 2020-03-14 ENCOUNTER — Other Ambulatory Visit: Payer: Self-pay | Admitting: Family Medicine

## 2020-03-14 DIAGNOSIS — Z9884 Bariatric surgery status: Secondary | ICD-10-CM | POA: Diagnosis not present

## 2020-03-14 DIAGNOSIS — R109 Unspecified abdominal pain: Secondary | ICD-10-CM

## 2020-03-14 DIAGNOSIS — F4322 Adjustment disorder with anxiety: Secondary | ICD-10-CM

## 2020-03-14 LAB — POCT URINALYSIS DIP (MANUAL ENTRY)
Bilirubin, UA: NEGATIVE
Blood, UA: NEGATIVE
Glucose, UA: 500 mg/dL — AB
Ketones, POC UA: NEGATIVE mg/dL
Leukocytes, UA: NEGATIVE
Nitrite, UA: NEGATIVE
Protein Ur, POC: NEGATIVE mg/dL
Spec Grav, UA: 1.01 (ref 1.010–1.025)
Urobilinogen, UA: 0.2 E.U./dL
pH, UA: 5.5 (ref 5.0–8.0)

## 2020-03-14 NOTE — Telephone Encounter (Signed)
Requested medication (s) are due for refill today: yes  Requested medication (s) are on the active medication list: yes  Last refill:  12/18/2019  Future visit scheduled: yes  Notes to clinic:  patient has appointment today   Requested Prescriptions  Pending Prescriptions Disp Refills   buPROPion (WELLBUTRIN XL) 300 MG 24 hr tablet [Pharmacy Med Name: BUPROPION HCL XL 300 MG TABLET] 90 tablet 1    Sig: TAKE 1 [place]      Psychiatry: Antidepressants - bupropion Failed - 03/14/2020  1:33 AM      Failed - Valid encounter within last 6 months    Recent Outpatient Visits           6 months ago Adjustment disorder with anxious mood   [facility], Lilia Argue, MD   9 months ago Adjustment disorder with anxious mood   [facility], Lilia Argue, MD   10 months ago Adjustment disorder with anxious mood   [facility], Lilia Argue, MD   1 year ago Acute lower respiratory tract infection   [facility], Lilia Argue, MD   1 year ago Wound check, abscess   [facility], Lilia Argue, MD       Future Appointments             Today Rutherford Guys, MD [facility], [place] BP in normal range    BP Readings from Last 1 Encounters:  02/18/20 136/77
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
## 2020-03-14 NOTE — Patient Instructions (Signed)
° ° ° °  If you have lab work done today you will be contacted with your lab results within the next 2 weeks.  If you have not heard from us then please contact us. The fastest way to get your results is to register for My Chart. ° ° °IF you received an x-ray today, you will receive an invoice from [facility]. Please contact [facility] at [phone] with questions or concerns regarding your invoice.  ° °IF you received labwork today, you will receive an invoice from [facility]. Please contact [facility] at [phone] with questions or concerns regarding your invoice.  ° °Our billing staff will not be able to assist you with questions regarding bills from these companies. ° °You will be contacted with the lab results as soon as they are available. The fastest way to get your results is to activate your My Chart account. Instructions are located on the last page of this paperwork. If you have not heard from us regarding the results in 2 weeks, please contact this office. °  ° ° ° °

## 2020-03-14 NOTE — Telephone Encounter (Signed)
Patient is requesting a refill of the following medications: Requested Prescriptions   Pending Prescriptions Disp Refills  . buPROPion (WELLBUTRIN XL) 300 MG 24 hr tablet [Pharmacy Med Name: BUPROPION HCL XL 300 MG TABLET] 90 tablet 1    Sig: TAKE 1 TABLET BY MOUTH EVERY DAY    Date of patient request: 03/14/2020 Last office visit: 03/14/2020 Date of last refill:09/15/2019 Last refill amount: 90 tablets Follow up time period per chart:

## 2020-03-16 ENCOUNTER — Other Ambulatory Visit: Payer: Self-pay

## 2020-03-16 ENCOUNTER — Ambulatory Visit (INDEPENDENT_AMBULATORY_CARE_PROVIDER_SITE_OTHER): Payer: BC Managed Care – PPO | Admitting: Endocrinology

## 2020-03-16 ENCOUNTER — Telehealth: Payer: Self-pay | Admitting: Family Medicine

## 2020-03-16 ENCOUNTER — Ambulatory Visit: Payer: BC Managed Care – PPO

## 2020-03-16 VITALS — BP 122/72 | HR 83 | Ht 68.0 in | Wt 251.0 lb

## 2020-03-16 DIAGNOSIS — R109 Unspecified abdominal pain: Secondary | ICD-10-CM | POA: Diagnosis not present

## 2020-03-16 DIAGNOSIS — E1142 Type 2 diabetes mellitus with diabetic polyneuropathy: Secondary | ICD-10-CM | POA: Diagnosis not present

## 2020-03-16 DIAGNOSIS — Z9884 Bariatric surgery status: Secondary | ICD-10-CM | POA: Diagnosis not present

## 2020-03-16 DIAGNOSIS — L97509 Non-pressure chronic ulcer of other part of unspecified foot with unspecified severity: Secondary | ICD-10-CM | POA: Diagnosis not present

## 2020-03-16 DIAGNOSIS — Z794 Long term (current) use of insulin: Secondary | ICD-10-CM | POA: Diagnosis not present

## 2020-03-16 DIAGNOSIS — E11621 Type 2 diabetes mellitus with foot ulcer: Secondary | ICD-10-CM | POA: Diagnosis not present

## 2020-03-16 DIAGNOSIS — E1169 Type 2 diabetes mellitus with other specified complication: Secondary | ICD-10-CM | POA: Diagnosis not present

## 2020-03-16 DIAGNOSIS — M869 Osteomyelitis, unspecified: Secondary | ICD-10-CM | POA: Diagnosis not present

## 2020-03-16 LAB — POCT GLYCOSYLATED HEMOGLOBIN (HGB A1C): Hemoglobin A1C: 6.5 % — AB (ref 4.0–5.6)

## 2020-03-16 MED ORDER — DAPAGLIFLOZIN PROPANEDIOL 10 MG PO TABS: 10.0000 mg | ORAL_TABLET | Freq: Every day | ORAL | 3 refills | Status: DC

## 2020-03-16 MED ORDER — V-GO 20 KIT: 1.0000 | PACK | Freq: Every day | 11 refills | Status: DC

## 2020-03-16 NOTE — Telephone Encounter (Signed)
Pt called and is wanting her labs sent to the [facility] on [address] to get those drawn. Please advise.

## 2020-03-16 NOTE — Telephone Encounter (Signed)
Pt called again and stated that she is needing labs faxed over. Talked to Lab and they are faxing these labs over to [address] now. Please advise.
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
## 2020-03-16 NOTE — Telephone Encounter (Signed)
Orders have been printed and faxed over and I have called pt to inform her and she stated that she was there and they have found the orders.

## 2020-03-16 NOTE — Patient Instructions (Addendum)
I have sent 2 prescriptions to your pharmacy, to increase the Farxiga, and to decrease to the V-GO-20 pump.  Please continue the same Ozempic.    check your blood sugar twice a day.  vary the time of day when you check, between before the 3 meals, and at bedtime.  also check if you have symptoms of your blood sugar being too high or too low.  please keep a record of the readings and bring it to your next appointment here (or you can bring the meter itself).  You can write it on any piece of paper.  please call us sooner if your blood sugar goes below 70, or if you have a lot of readings over 200.   Please come back for a follow-up appointment in 2-3 months.  When you come back, please me know about how many clicks you are taking per day.

## 2020-03-17 LAB — COMPREHENSIVE METABOLIC PANEL
ALT: 13 IU/L (ref 0–32)
AST: 14 IU/L (ref 0–40)
Albumin/Globulin Ratio: 1.6 (ref 1.2–2.2)
Albumin: 4.4 g/dL (ref 3.8–4.9)
Alkaline Phosphatase: 166 IU/L — ABNORMAL HIGH (ref 48–121)
BUN/Creatinine Ratio: 20 (ref 9–23)
BUN: 17 mg/dL (ref 6–24)
Bilirubin Total: 0.3 mg/dL (ref 0.0–1.2)
CO2: 22 mmol/L (ref 20–29)
Calcium: 9 mg/dL (ref 8.7–10.2)
Chloride: 101 mmol/L (ref 96–106)
Creatinine, Ser: 0.86 mg/dL (ref 0.57–1.00)
GFR calc Af Amer: 89 mL/min/{1.73_m2} (ref >59)
GFR calc non Af Amer: 77 mL/min/{1.73_m2} (ref >59)
Globulin, Total: 2.7 g/dL (ref 1.5–4.5)
Glucose: 91 mg/dL (ref 65–99)
Potassium: 4.2 mmol/L (ref 3.5–5.2)
Sodium: 138 mmol/L (ref 134–144)
Total Protein: 7.1 g/dL (ref 6.0–8.5)

## 2020-03-17 LAB — IRON,TIBC AND FERRITIN PANEL
Ferritin: 19 ng/mL (ref 15–150)
Iron Saturation: 9 % — CL (ref 15–55)
Iron: 39 ug/dL (ref 27–159)
Total Iron Binding Capacity: 414 ug/dL (ref 250–450)
UIBC: 375 ug/dL (ref 131–425)

## 2020-03-17 LAB — CBC
Hematocrit: 38.2 % (ref 34.0–46.6)
Hemoglobin: 11.9 g/dL (ref 11.1–15.9)
MCH: 25.4 pg — ABNORMAL LOW (ref 26.6–33.0)
MCHC: 31.2 g/dL — ABNORMAL LOW (ref 31.5–35.7)
MCV: 81 fL (ref 79–97)
Platelets: 325 10*3/uL (ref 150–450)
RBC: 4.69 x10E6/uL (ref 3.77–5.28)
RDW: 15.9 % — ABNORMAL HIGH (ref 11.7–15.4)
WBC: 9.3 10*3/uL (ref 3.4–10.8)

## 2020-03-17 LAB — LIPASE: Lipase: 24 U/L (ref 14–72)

## 2020-03-17 LAB — TSH: TSH: 1.89 u[IU]/mL (ref 0.450–4.500)

## 2020-03-17 LAB — VITAMIN B12: Vitamin B-12: 246 pg/mL (ref 232–1245)

## 2020-03-17 LAB — VITAMIN D 25 HYDROXY (VIT D DEFICIENCY, FRACTURES): Vit D, 25-Hydroxy: 11 ng/mL — ABNORMAL LOW (ref 30.0–100.0)

## 2020-03-20 ENCOUNTER — Encounter: Payer: Self-pay | Admitting: Family Medicine

## 2020-03-22 NOTE — Telephone Encounter (Signed)
Pt requesting lab results.

## 2020-03-23 MED ORDER — VITAMIN D (ERGOCALCIFEROL) 1.25 MG (50000 UNIT) PO CAPS: 50000.0000 [IU] | ORAL_CAPSULE | ORAL | 1 refills | Status: DC

## 2020-03-23 NOTE — Telephone Encounter (Signed)
Result note sent via mychart

## 2020-03-23 NOTE — Addendum Note (Signed)
Addended by: Rutherford Guys on: 03/23/2020 07:25 PM   Modules accepted: Orders

## 2020-03-27 ENCOUNTER — Other Ambulatory Visit: Payer: Self-pay | Admitting: Endocrinology

## 2020-03-27 DIAGNOSIS — Z794 Long term (current) use of insulin: Secondary | ICD-10-CM

## 2020-04-06 DIAGNOSIS — E119 Type 2 diabetes mellitus without complications: Secondary | ICD-10-CM | POA: Diagnosis not present

## 2020-04-14 ENCOUNTER — Other Ambulatory Visit: Payer: Self-pay

## 2020-04-14 ENCOUNTER — Ambulatory Visit (INDEPENDENT_AMBULATORY_CARE_PROVIDER_SITE_OTHER): Payer: BC Managed Care – PPO

## 2020-04-14 ENCOUNTER — Ambulatory Visit
Admission: RE | Admit: 2020-04-14 | Discharge: 2020-04-14 | Disposition: A | Discharge status: Home / Self Care | Payer: BC Managed Care – PPO | Source: Ambulatory Visit | Attending: Emergency Medicine | Admitting: Emergency Medicine

## 2020-04-14 VITALS — BP 141/87 | HR 80 | Temp 98.7°F | Resp 18

## 2020-04-14 DIAGNOSIS — W19XXXA Unspecified fall, initial encounter: Secondary | ICD-10-CM

## 2020-04-14 DIAGNOSIS — S63502A Unspecified sprain of left wrist, initial encounter: Secondary | ICD-10-CM

## 2020-04-14 DIAGNOSIS — M25532 Pain in left wrist: Secondary | ICD-10-CM

## 2020-04-14 DIAGNOSIS — S6992XA Unspecified injury of left wrist, hand and finger(s), initial encounter: Secondary | ICD-10-CM | POA: Diagnosis not present
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
## 2020-04-14 NOTE — ED Triage Notes (Signed)
Patient fell today.  Tripped over concrete curbing.  Pain in left wrist.  Small wound to palm of left hand, bilateral knee soreness.  Abrasion to lower right leg.  Abrasion to right elbow and right wrist.  Reports last tetanus was last year

## 2020-04-14 NOTE — Discharge Instructions (Addendum)
RICE: rest, ice, compression, elevation as needed for pain.    Heat therapy (hot compress, warm wash rag, hot showers, etc.) can help relax muscles and soothe muscle aches. Cold therapy (ice packs) can be used to help swelling both after injury and after prolonged use of areas of chronic pain/aches.  Pain medication:  350 mg-1000 mg of Tylenol (acetaminophen) and/or 200 mg - 800 mg of Advil (ibuprofen, Motrin) every 8 hours as needed.  May alternate between the two throughout the day as they are generally safe to take together.  DO NOT exceed more than 3000 mg of Tylenol or 3200 mg of ibuprofen in a 24 hour period as this could damage your stomach, kidneys, liver, or increase your bleeding risk.   Important to follow up with specialist(s) below for further evaluation/management if your symptoms persist or worsen. 

## 2020-04-20 ENCOUNTER — Other Ambulatory Visit: Payer: Self-pay | Admitting: Endocrinology

## 2020-04-20 DIAGNOSIS — Z794 Long term (current) use of insulin: Secondary | ICD-10-CM

## 2020-04-25 ENCOUNTER — Telehealth: Payer: Self-pay

## 2020-04-25 NOTE — Telephone Encounter (Signed)
PRIOR AUTHORIZATION  PA initiation date: 04/25/20  Device: [place] completed electronically through [employer] My Meds: Yes   APPROVAL  Device: [place]: [place] PA response: APPROVED Approval dates: 04/25/20 through 04/24/21  Documents have been labeled and placed in scan file for HIM and for our future reference.  Katoria PARRISH (KeyAlbertine Patricia)  This request has received a Favorable outcome from [facility].  Please keep in mind this is not a guarantee of payment. Eligibility and Benefit determinations will be made at the time of service.  Please note any additional information provided by [facility] at the bottom of the screen. [place] KeyAlbertine Patricia - Rx #: [id number] Need help? Call us at [phone] Outcome Approvedtoday Effective from 04/25/2020 through 04/24/2021. Drug Dexcom G6 Transmitter Form Librarian, academic Form (CB) Original Claim Info [id number]

## 2020-04-27 ENCOUNTER — Other Ambulatory Visit: Payer: Self-pay | Admitting: Endocrinology

## 2020-04-27 DIAGNOSIS — Z794 Long term (current) use of insulin: Secondary | ICD-10-CM

## 2020-04-28 DIAGNOSIS — Z79899 Other long term (current) drug therapy: Secondary | ICD-10-CM | POA: Diagnosis not present

## 2020-04-28 DIAGNOSIS — M25521 Pain in right elbow: Secondary | ICD-10-CM | POA: Diagnosis not present

## 2020-04-28 DIAGNOSIS — G894 Chronic pain syndrome: Secondary | ICD-10-CM | POA: Diagnosis not present

## 2020-04-28 DIAGNOSIS — S52124A Nondisplaced fracture of head of right radius, initial encounter for closed fracture: Secondary | ICD-10-CM | POA: Diagnosis not present

## 2020-04-28 DIAGNOSIS — M25511 Pain in right shoulder: Secondary | ICD-10-CM | POA: Diagnosis not present

## 2020-04-28 DIAGNOSIS — M79672 Pain in left foot: Secondary | ICD-10-CM | POA: Diagnosis not present

## 2020-04-28 DIAGNOSIS — Z5181 Encounter for therapeutic drug level monitoring: Secondary | ICD-10-CM | POA: Diagnosis not present

## 2020-04-28 DIAGNOSIS — E11610 Type 2 diabetes mellitus with diabetic neuropathic arthropathy: Secondary | ICD-10-CM | POA: Diagnosis not present

## 2020-04-28 DIAGNOSIS — I10 Essential (primary) hypertension: Secondary | ICD-10-CM | POA: Diagnosis not present

## 2020-04-28 DIAGNOSIS — M25532 Pain in left wrist: Secondary | ICD-10-CM | POA: Diagnosis not present

## 2020-04-28 DIAGNOSIS — S62115A Nondisplaced fracture of triquetrum [cuneiform] bone, left wrist, initial encounter for closed fracture: Secondary | ICD-10-CM | POA: Diagnosis not present

## 2020-05-03 ENCOUNTER — Encounter: Payer: Self-pay | Admitting: Gastroenterology

## 2020-05-03 ENCOUNTER — Telehealth: Payer: Self-pay

## 2020-05-03 ENCOUNTER — Ambulatory Visit (INDEPENDENT_AMBULATORY_CARE_PROVIDER_SITE_OTHER): Payer: BC Managed Care – PPO | Admitting: Gastroenterology

## 2020-05-03 VITALS — BP 120/76 | HR 76 | Ht 67.5 in | Wt 256.1 lb

## 2020-05-03 DIAGNOSIS — R1013 Epigastric pain: Secondary | ICD-10-CM | POA: Diagnosis not present

## 2020-05-03 NOTE — Telephone Encounter (Signed)
Patent has been notified and aware of the instructions

## 2020-05-03 NOTE — Patient Instructions (Addendum)
If you are age 54 or older, your body mass index should be between 23-30. Your Body mass index is 39.52 kg/m. If this is out of the aforementioned range listed, please consider follow up with your Primary Care Provider.  If you are age 37 or younger, your body mass index should be between 19-25. Your Body mass index is 39.52 kg/m. If this is out of the aformentioned range listed, please consider follow up with your Primary Care Provider.   You have been scheduled for an endoscopy. Please follow written instructions given to you at your visit today. If you use inhalers (even only as needed), please bring them with you on the day of your procedure.  You have been scheduled for an abdominal ultrasound at [facility] (1st floor of hospital) on 05-18-2020 at 9am. Please arrive 15 minutes prior to your appointment for registration. Make certain not to have anything to eat or drink 6 hours prior to your appointment. Should you need to reschedule your appointment, please contact radiology at [phone]. This test typically takes about 30 minutes to perform.   It was a pleasure to see you today!  Dr. Loletha Carrow

## 2020-05-03 NOTE — Telephone Encounter (Signed)
Vivien Rota, please see Dr. Cordelia Pen response to your questions below. If additional information is needed, please just let us know.
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
## 2020-05-03 NOTE — Telephone Encounter (Signed)
Donna Kidd October 12, 1965 [id number]  Dear Dr.Ellison    Nelida Meuse, MD has scheduled the above patient for an EGD (05-12-2020 @10  am) .Our records show that she is on insulin therapy via an insulin pump.  Our EGD prep protocol requires that:   the patient must be on a clear liquid diet the day of the procedure and she must be NPO for 3 hours prior to the procedure   Please advise [place] of any adjustments that need to be made to the patient's insulin pump therapy prior to the above procedure date.    Please route back to me. If you have any question, please call me at [phone].  Thank you for your help with this matter.  Sincerely,  Vivien Rota, CMA

## 2020-05-03 NOTE — Telephone Encounter (Signed)
Take pump off when you start liq diet day prior to EGD.  Replace when eating again after.

## 2020-05-05 ENCOUNTER — Encounter: Payer: Self-pay | Admitting: Gastroenterology

## 2020-05-09 ENCOUNTER — Encounter (INDEPENDENT_AMBULATORY_CARE_PROVIDER_SITE_OTHER): Payer: BC Managed Care – PPO | Admitting: Ophthalmology

## 2020-05-10 ENCOUNTER — Encounter (INDEPENDENT_AMBULATORY_CARE_PROVIDER_SITE_OTHER): Payer: BC Managed Care – PPO | Admitting: Ophthalmology

## 2020-05-12 ENCOUNTER — Encounter: Payer: Self-pay | Admitting: Gastroenterology

## 2020-05-12 ENCOUNTER — Ambulatory Visit (AMBULATORY_SURGERY_CENTER): Payer: BC Managed Care – PPO | Admitting: Gastroenterology

## 2020-05-12 ENCOUNTER — Other Ambulatory Visit: Payer: Self-pay

## 2020-05-12 VITALS — BP 134/71 | HR 66 | Temp 97.3°F | Resp 13 | Ht 67.0 in | Wt 256.0 lb

## 2020-05-12 DIAGNOSIS — E119 Type 2 diabetes mellitus without complications: Secondary | ICD-10-CM | POA: Diagnosis not present

## 2020-05-12 DIAGNOSIS — R1013 Epigastric pain: Secondary | ICD-10-CM

## 2020-05-12 MED ORDER — SODIUM CHLORIDE 0.9 % IV SOLN: 500.0000 mL | Freq: Once | INTRAVENOUS | Status: DC

## 2020-05-12 NOTE — Progress Notes (Signed)
To PACU, VSS. Report to [email or web address] 

## 2020-05-12 NOTE — Patient Instructions (Signed)
YOU HAD AN ENDOSCOPIC PROCEDURE TODAY AT THE [facility]:   Refer to the procedure report that was given to you for any specific questions about what was found during the examination.  If the procedure report does not answer your questions, please call your gastroenterologist to clarify.  If you requested that your care partner not be given the details of your procedure findings, then the procedure report has been included in a sealed envelope for you to review at your convenience later.  YOU SHOULD EXPECT: Some feelings of bloating in the abdomen. Passage of more gas than usual.  Walking can help get rid of the air that was put into your GI tract during the procedure and reduce the bloating. If you had a lower endoscopy (such as a colonoscopy or flexible sigmoidoscopy) you may notice spotting of blood in your stool or on the toilet paper. If you underwent a bowel prep for your procedure, you may not have a normal bowel movement for a few days.  Please Note:  You might notice some irritation and congestion in your nose or some drainage.  This is from the oxygen used during your procedure.  There is no need for concern and it should clear up in a day or so.  SYMPTOMS TO REPORT IMMEDIATELY:    Following upper endoscopy (EGD)  Vomiting of blood or coffee ground material  New chest pain or pain under the shoulder blades  Painful or persistently difficult swallowing  New shortness of breath  Fever of 100F or higher  Black, tarry-looking stools  For urgent or emergent issues, a gastroenterologist can be reached at any hour by calling [phone]. Do not use MyChart messaging for urgent concerns.    DIET:  We do recommend a small meal at first, but then you may proceed to your regular diet.  Drink plenty of fluids but you should avoid alcoholic beverages for 24 hours.  ACTIVITY:  You should plan to take it easy for the rest of today and you should NOT DRIVE or use heavy machinery  until tomorrow (because of the sedation medicines used during the test).    FOLLOW UP: Our staff will call the number listed on your records 48-72 hours following your procedure to check on you and address any questions or concerns that you may have regarding the information given to you following your procedure. If we do not reach you, we will leave a message.  We will attempt to reach you two times.  During this call, we will ask if you have developed any symptoms of COVID 19. If you develop any symptoms (ie: fever, flu-like symptoms, shortness of breath, cough etc.) before then, please call [phone].  If you test positive for Covid 19 in the 2 weeks post procedure, please call and report this information to us.    If any biopsies were taken you will be contacted by phone or by letter within the next 1-3 weeks.  Please call us at [phone] if you have not heard about the biopsies in 3 weeks.    SIGNATURES/CONFIDENTIALITY: You and/or your care partner have signed paperwork which will be entered into your electronic medical record.  These signatures attest to the fact that that the information above on your After Visit Summary has been reviewed and is understood.  Full responsibility of the confidentiality of this discharge information lies with you and/or your care-partner. 

## 2020-05-12 NOTE — Progress Notes (Signed)
Pt's states no medical or surgical changes since previsit or office visit. 

## 2020-05-12 NOTE — Op Note (Signed)
[facility] Patient Name: Donna Kidd Procedure Date: 05/12/2020 10:01 AM MRN: [id number] Endoscopist: Sherilyn Cooter L. Myrtie Neither , MD Age: 54 Referring MD:  Date of Birth: 12/21/1965 Gender: Female Account #: [id number] Procedure:                Upper GI endoscopy Indications:              Epigastric abdominal pain (episodic) Medicines:                Monitored Anesthesia Care Procedure:                Pre-Anesthesia Assessment:                           - Prior to the procedure, a History and Physical                            was performed, and patient medications and                            allergies were reviewed. The patient's tolerance of                            previous anesthesia was also reviewed. The risks                            and benefits of the procedure and the sedation                            options and risks were discussed with the patient.                            All questions were answered, and informed consent                            was obtained. Prior Anticoagulants: The patient has                            taken no previous anticoagulant or antiplatelet                            agents. ASA Grade Assessment: III - A patient with                            severe systemic disease. After reviewing the risks                            and benefits, the patient was deemed in                            satisfactory condition to undergo the procedure.                           After obtaining informed consent, the endoscope was  passed under direct vision. Throughout the                            procedure, the patient's blood pressure, pulse, and                            oxygen saturations were monitored continuously. The                            Endoscope was introduced through the mouth, and                            advanced to the efferent jejunal loop. The upper GI                            endoscopy was  accomplished without difficulty. The                            patient tolerated the procedure well. Scope In: Scope Out: Findings:                 The esophagus was normal.                           Evidence of a gastric bypass was found. A gastric                            pouch with a normal size was found. No ulcer seen.                            The gastrojejunal anastomosis was characterized by                            healthy appearing mucosa. There was along blind                            limb (to the left), completely intubated and                            normal. The efferent limb was also deeply intubated                            and normal. The jejunal-jejunal anastomosis was not                            identified.                           The examined jejunum was normal. Complications:            No immediate complications. Estimated Blood Loss:     Estimated blood loss: none. Impression:               - Normal esophagus.                           -  Gastric bypass with a normal-sized pouch.                            Gastrojejunal anastomosis characterized by healthy                            appearing mucosa.                           - Normal examined jejunum.                           - No specimens collected. Recommendation:           - Patient has a contact number available for                            emergencies. The signs and symptoms of potential                            delayed complications were discussed with the                            patient. Return to normal activities tomorrow.                            Written discharge instructions were provided to the                            patient.                           - Resume previous diet.                           - Continue present medications.                           - Await results of RUQ Ultrasound next week. Tonae Livolsi L. Myrtie Neither, MD 05/12/2020 10:31:43 AM This report has been  signed electronically.

## 2020-05-16 ENCOUNTER — Telehealth: Payer: Self-pay | Admitting: *Deleted

## 2020-05-16 NOTE — Telephone Encounter (Signed)
  Follow up Call-  Call back number 05/12/2020 05/09/2018  Post procedure Call Back phone  # [phone]  Permission to leave phone message Yes Yes  Some recent data might be hidden     Patient questions:  Do you have a fever, pain , or abdominal swelling? No. Pain Score  0 *  Have you tolerated food without any problems? Yes.    Have you been able to return to your normal activities? Yes.    Do you have any questions about your discharge instructions: Diet   No. Medications  No. Follow up visit  No.  Do you have questions or concerns about your Care? No.  Actions: * If pain score is 4 or above: No action needed, pain <4.  1. Have you developed a fever since your procedure? no  2.   Have you had an respiratory symptoms (SOB or cough) since your procedure? no  3.   Have you tested positive for COVID 19 since your procedure no  4.   Have you had any family members/close contacts diagnosed with the COVID 19 since your procedure?  no   If yes to any of these questions please route to Joylene John, RN and Joella Prince, RN

## 2020-05-18 ENCOUNTER — Other Ambulatory Visit: Payer: Self-pay

## 2020-05-18 ENCOUNTER — Ambulatory Visit (HOSPITAL_COMMUNITY): Payer: BC Managed Care – PPO

## 2020-05-25 ENCOUNTER — Other Ambulatory Visit: Payer: Self-pay

## 2020-05-25 ENCOUNTER — Ambulatory Visit (HOSPITAL_COMMUNITY)
Admission: RE | Admit: 2020-05-25 | Discharge: 2020-05-25 | Disposition: A | Discharge status: Home / Self Care | Payer: BC Managed Care – PPO | Source: Ambulatory Visit | Attending: Gastroenterology | Admitting: Gastroenterology

## 2020-05-25 DIAGNOSIS — R1013 Epigastric pain: Secondary | ICD-10-CM | POA: Diagnosis not present

## 2020-05-25 DIAGNOSIS — L97509 Non-pressure chronic ulcer of other part of unspecified foot with unspecified severity: Secondary | ICD-10-CM | POA: Diagnosis not present

## 2020-05-25 DIAGNOSIS — E1140 Type 2 diabetes mellitus with diabetic neuropathy, unspecified: Secondary | ICD-10-CM | POA: Diagnosis not present

## 2020-05-25 DIAGNOSIS — E1169 Type 2 diabetes mellitus with other specified complication: Secondary | ICD-10-CM | POA: Diagnosis not present

## 2020-05-25 DIAGNOSIS — E11621 Type 2 diabetes mellitus with foot ulcer: Secondary | ICD-10-CM | POA: Diagnosis not present

## 2020-05-25 DIAGNOSIS — K760 Fatty (change of) liver, not elsewhere classified: Secondary | ICD-10-CM | POA: Diagnosis not present

## 2020-05-25 DIAGNOSIS — R109 Unspecified abdominal pain: Secondary | ICD-10-CM | POA: Diagnosis not present

## 2020-05-26 ENCOUNTER — Telehealth: Payer: Self-pay

## 2020-05-26 DIAGNOSIS — E1143 Type 2 diabetes mellitus with diabetic autonomic (poly)neuropathy: Secondary | ICD-10-CM | POA: Diagnosis not present

## 2020-05-26 DIAGNOSIS — S52124D Nondisplaced fracture of head of right radius, subsequent encounter for closed fracture with routine healing: Secondary | ICD-10-CM | POA: Diagnosis not present

## 2020-05-26 DIAGNOSIS — I10 Essential (primary) hypertension: Secondary | ICD-10-CM | POA: Diagnosis not present

## 2020-05-26 DIAGNOSIS — Z8631 Personal history of diabetic foot ulcer: Secondary | ICD-10-CM | POA: Diagnosis not present

## 2020-05-26 DIAGNOSIS — E11610 Type 2 diabetes mellitus with diabetic neuropathic arthropathy: Secondary | ICD-10-CM | POA: Diagnosis not present

## 2020-05-26 DIAGNOSIS — S62111D Displaced fracture of triquetrum [cuneiform] bone, right wrist, subsequent encounter for fracture with routine healing: Secondary | ICD-10-CM | POA: Diagnosis not present

## 2020-05-26 DIAGNOSIS — Z4789 Encounter for other orthopedic aftercare: Secondary | ICD-10-CM | POA: Diagnosis not present

## 2020-05-26 DIAGNOSIS — M25372 Other instability, left ankle: Secondary | ICD-10-CM | POA: Diagnosis not present

## 2020-05-26 NOTE — Telephone Encounter (Signed)
Received call report from [facility] radiology regarding [place]. See below:  IMPRESSION: 1. Area in the left lobe of the liver demonstrating somewhat mixed echogenicity. This area of mixed echogenicity does not have an appearance typical of hemangioma. Given concern for a mass in this area, pre and serial post-contrast MR or CT of the liver is advised to further evaluate.  2.  Suspect underlying hepatic steatosis.  3. Sludge in gallbladder. No gallstones seen. Tiny gallstones could easily be intermingled with sludge of this nature. No gallbladder wall thickening or pericholecystic fluid.  These results will be called to the ordering clinician or representative by the Radiologist Assistant, and communication documented in the PACS or [employer].  Dr. Loletha Carrow notified.

## 2020-05-27 ENCOUNTER — Other Ambulatory Visit: Payer: Self-pay

## 2020-05-27 DIAGNOSIS — K769 Liver disease, unspecified: Secondary | ICD-10-CM

## 2020-05-27 NOTE — Telephone Encounter (Signed)
Thank you for the note.  I spoke to the patient about her results today and sent further plans to my nurse.  - HD

## 2020-05-29 ENCOUNTER — Other Ambulatory Visit: Payer: Self-pay | Admitting: Endocrinology

## 2020-06-02 ENCOUNTER — Other Ambulatory Visit: Payer: Self-pay

## 2020-06-02 ENCOUNTER — Ambulatory Visit (INDEPENDENT_AMBULATORY_CARE_PROVIDER_SITE_OTHER): Payer: BC Managed Care – PPO | Admitting: Family Medicine

## 2020-06-02 ENCOUNTER — Other Ambulatory Visit: Payer: Self-pay | Admitting: Family Medicine

## 2020-06-02 VITALS — BP 140/83 | HR 78 | Temp 97.9°F | Ht 67.0 in | Wt 259.6 lb

## 2020-06-02 DIAGNOSIS — F4322 Adjustment disorder with anxiety: Secondary | ICD-10-CM | POA: Diagnosis not present

## 2020-06-02 DIAGNOSIS — Z23 Encounter for immunization: Secondary | ICD-10-CM | POA: Diagnosis not present

## 2020-06-02 DIAGNOSIS — E11610 Type 2 diabetes mellitus with diabetic neuropathic arthropathy: Secondary | ICD-10-CM

## 2020-06-02 DIAGNOSIS — Z1231 Encounter for screening mammogram for malignant neoplasm of breast: Secondary | ICD-10-CM

## 2020-06-02 DIAGNOSIS — E538 Deficiency of other specified B group vitamins: Secondary | ICD-10-CM | POA: Diagnosis not present

## 2020-06-02 DIAGNOSIS — E559 Vitamin D deficiency, unspecified: Secondary | ICD-10-CM

## 2020-06-02 DIAGNOSIS — Z0001 Encounter for general adult medical examination with abnormal findings: Secondary | ICD-10-CM

## 2020-06-02 DIAGNOSIS — Z0000 Encounter for general adult medical examination without abnormal findings: Secondary | ICD-10-CM

## 2020-06-02 MED ORDER — BUPROPION HCL ER (XL) 300 MG PO TB24
300.0000 mg | ORAL_TABLET | Freq: Every day | ORAL | 1 refills | Status: DC
Start: 2020-06-02 — End: 2023-10-29

## 2020-06-02 MED ORDER — CITALOPRAM HYDROBROMIDE 20 MG PO TABS: 20.0000 mg | ORAL_TABLET | Freq: Every day | ORAL | 1 refills | Status: DC

## 2020-06-02 NOTE — Patient Instructions (Addendum)
Please start citalopram 1/2 tab (10mg ) daily. Can increase to 1 tab (20mg ) daily after 2-3 weeks for anxiety.    If you have lab work done today you will be contacted with your lab results within the next 2 weeks.  If you have not heard from [place] then please contact us. The fastest way to get your results is to register for My Chart.   IF you received an x-ray today, you will receive an invoice from [facility]. Please contact [facility] at [phone] with questions or concerns regarding your invoice.   IF you received labwork today, you will receive an invoice from [facility]. Please contact [facility] at [phone] with questions or concerns regarding your invoice.   Our billing staff will not be able to assist you with questions regarding bills from these companies.  You will be contacted with the lab results as soon as they are available. The fastest way to get your results is to activate your My Chart account. Instructions are located on the last page of this paperwork. If you have not heard from [place] regarding the results in 2 weeks, please contact this office.
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
## 2020-06-03 LAB — MICROALBUMIN / CREATININE URINE RATIO
Creatinine, Urine: 19.9 mg/dL
Microalb/Creat Ratio: <15 mg/g creat (ref 0–29)
Microalbumin, Urine: <3 ug/mL

## 2020-06-03 LAB — VITAMIN D 25 HYDROXY (VIT D DEFICIENCY, FRACTURES): Vit D, 25-Hydroxy: 24 ng/mL — ABNORMAL LOW (ref 30.0–100.0)

## 2020-06-03 LAB — VITAMIN B12: Vitamin B-12: 319 pg/mL (ref 232–1245)

## 2020-06-07 ENCOUNTER — Other Ambulatory Visit: Payer: Self-pay

## 2020-06-07 ENCOUNTER — Ambulatory Visit (HOSPITAL_COMMUNITY)
Admission: RE | Admit: 2020-06-07 | Discharge: 2020-06-07 | Disposition: A | Discharge status: Home / Self Care | Payer: BC Managed Care – PPO | Source: Ambulatory Visit | Attending: Gastroenterology | Admitting: Gastroenterology

## 2020-06-07 DIAGNOSIS — H4311 Vitreous hemorrhage, right eye: Secondary | ICD-10-CM | POA: Diagnosis not present

## 2020-06-07 DIAGNOSIS — E113552 Type 2 diabetes mellitus with stable proliferative diabetic retinopathy, left eye: Secondary | ICD-10-CM | POA: Diagnosis not present

## 2020-06-07 DIAGNOSIS — K769 Liver disease, unspecified: Secondary | ICD-10-CM | POA: Diagnosis not present

## 2020-06-07 DIAGNOSIS — K828 Other specified diseases of gallbladder: Secondary | ICD-10-CM | POA: Diagnosis not present

## 2020-06-07 DIAGNOSIS — E113591 Type 2 diabetes mellitus with proliferative diabetic retinopathy without macular edema, right eye: Secondary | ICD-10-CM | POA: Diagnosis not present

## 2020-06-07 DIAGNOSIS — K7689 Other specified diseases of liver: Secondary | ICD-10-CM | POA: Diagnosis not present

## 2020-06-07 DIAGNOSIS — Z794 Long term (current) use of insulin: Secondary | ICD-10-CM | POA: Diagnosis not present

## 2020-06-07 LAB — HM DIABETES EYE EXAM

## 2020-06-07 MED ORDER — GADOBUTROL 1 MMOL/ML IV SOLN
10.0000 mL | Freq: Once | INTRAVENOUS | Status: AC | PRN
  Administered 2020-06-07: 10 mL via INTRAVENOUS

## 2020-06-13 ENCOUNTER — Ambulatory Visit (INDEPENDENT_AMBULATORY_CARE_PROVIDER_SITE_OTHER): Payer: BC Managed Care – PPO | Admitting: Ophthalmology

## 2020-06-13 ENCOUNTER — Encounter (INDEPENDENT_AMBULATORY_CARE_PROVIDER_SITE_OTHER): Payer: Self-pay | Admitting: Ophthalmology

## 2020-06-13 ENCOUNTER — Other Ambulatory Visit: Payer: Self-pay

## 2020-06-13 DIAGNOSIS — Z794 Long term (current) use of insulin: Secondary | ICD-10-CM

## 2020-06-13 DIAGNOSIS — H4311 Vitreous hemorrhage, right eye: Secondary | ICD-10-CM

## 2020-06-13 DIAGNOSIS — E113511 Type 2 diabetes mellitus with proliferative diabetic retinopathy with macular edema, right eye: Secondary | ICD-10-CM | POA: Diagnosis not present

## 2020-06-13 DIAGNOSIS — E113552 Type 2 diabetes mellitus with stable proliferative diabetic retinopathy, left eye: Secondary | ICD-10-CM

## 2020-06-13 DIAGNOSIS — E113553 Type 2 diabetes mellitus with stable proliferative diabetic retinopathy, bilateral: Secondary | ICD-10-CM

## 2020-06-13 DIAGNOSIS — Z9889 Other specified postprocedural states: Secondary | ICD-10-CM | POA: Diagnosis not present

## 2020-06-13 HISTORY — DX: Vitreous hemorrhage, right eye: H43.11

## 2020-06-13 MED ORDER — BEVACIZUMAB CHEMO INJECTION 1.25MG/0.05ML SYRINGE FOR KALEIDOSCOPE
1.2500 mg | INTRAVITREAL | Status: AC | PRN
  Administered 2020-06-13: 1.25 mg via INTRAVITREAL
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
## 2020-06-13 NOTE — Assessment & Plan Note (Signed)

## 2020-06-13 NOTE — Assessment & Plan Note (Signed)
Will deliver intravitreal Avastin for the proliferative diabetic retinopathy

## 2020-06-13 NOTE — Assessment & Plan Note (Addendum)
No retinal detachment.  The setting of an eye that has been no recent episodes of known Valsalva maneuver causing this vitreous hemorrhage, this could be a spontaneous hemorrhage or could be exacerbated by this vitreous hemorrhage, this could be a spontaneous hemorrhage or could be exacerbated by episodic hypotension to the patient.  I did ask about sleep apnea.  She does have a positive review of systems for sleep apnea. The presence of sleep apnea is a known cause of episodic hypertension during the night.  We will treat the right eye with intravitreal Avastin to hasten clearing vitreous hemorrhage.  He has debilitating symptoms from this dense hemorrhage and thus we will tentatively plan surgical intervention in 9 to 10 days if no hemorrhage clearing occurs

## 2020-06-14 DIAGNOSIS — E119 Type 2 diabetes mellitus without complications: Secondary | ICD-10-CM | POA: Diagnosis not present

## 2020-06-14 NOTE — Telephone Encounter (Signed)
Dr. Loletha Carrow,   Just and FYI. Thanks.

## 2020-06-15 ENCOUNTER — Encounter (INDEPENDENT_AMBULATORY_CARE_PROVIDER_SITE_OTHER): Payer: BC Managed Care – PPO | Admitting: Ophthalmology

## 2020-06-16 ENCOUNTER — Ambulatory Visit (INDEPENDENT_AMBULATORY_CARE_PROVIDER_SITE_OTHER): Payer: BC Managed Care – PPO | Admitting: Endocrinology

## 2020-06-16 ENCOUNTER — Encounter (INDEPENDENT_AMBULATORY_CARE_PROVIDER_SITE_OTHER): Payer: Self-pay | Admitting: Ophthalmology

## 2020-06-16 ENCOUNTER — Other Ambulatory Visit: Payer: Self-pay

## 2020-06-16 VITALS — BP 142/76 | HR 81 | Ht 67.0 in | Wt 259.4 lb

## 2020-06-16 DIAGNOSIS — Z794 Long term (current) use of insulin: Secondary | ICD-10-CM | POA: Diagnosis not present

## 2020-06-16 DIAGNOSIS — E1142 Type 2 diabetes mellitus with diabetic polyneuropathy: Secondary | ICD-10-CM | POA: Diagnosis not present

## 2020-06-16 DIAGNOSIS — E113511 Type 2 diabetes mellitus with proliferative diabetic retinopathy with macular edema, right eye: Secondary | ICD-10-CM

## 2020-06-16 LAB — POCT GLYCOSYLATED HEMOGLOBIN (HGB A1C): Hemoglobin A1C: 7.5 % — AB (ref 4.0–5.6)
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
## 2020-06-16 NOTE — Patient Instructions (Addendum)
Try taking the pump off at night.  Also, add 1 more click per meal Please continue the same Ozempic and Farxiga  check your blood sugar twice a day.  vary the time of day when you check, between before the 3 meals, and at bedtime.  also check if you have symptoms of your blood sugar being too high or too low.  please keep a record of the readings and bring it to your next appointment here (or you can bring the meter itself).  You can write it on any piece of paper.  please call us sooner if your blood sugar goes below 70, or if you have a lot of readings over 200.   Please come back for a follow-up appointment in 3 months.

## 2020-06-20 ENCOUNTER — Encounter (INDEPENDENT_AMBULATORY_CARE_PROVIDER_SITE_OTHER): Payer: Self-pay | Admitting: Ophthalmology

## 2020-06-20 ENCOUNTER — Ambulatory Visit (INDEPENDENT_AMBULATORY_CARE_PROVIDER_SITE_OTHER): Payer: BC Managed Care – PPO | Admitting: Ophthalmology

## 2020-06-20 ENCOUNTER — Other Ambulatory Visit: Payer: Self-pay

## 2020-06-20 DIAGNOSIS — H4051X1 Glaucoma secondary to other eye disorders, right eye, mild stage: Secondary | ICD-10-CM

## 2020-06-20 DIAGNOSIS — H4311 Vitreous hemorrhage, right eye: Secondary | ICD-10-CM

## 2020-06-20 MED ORDER — PREDNISOLONE ACETATE 1 % OP SUSP: 1.0000 [drp] | Freq: Four times a day (QID) | OPHTHALMIC | 0 refills | Status: AC

## 2020-06-20 MED ORDER — OFLOXACIN 0.3 % OP SOLN: 1.0000 [drp] | Freq: Four times a day (QID) | OPHTHALMIC | 0 refills | Status: AC

## 2020-06-20 MED ORDER — PREDNISOLONE ACETATE 1 % OP SUSP: 1.0000 [drp] | Freq: Four times a day (QID) | OPHTHALMIC | 0 refills | Status: DC

## 2020-06-20 NOTE — Assessment & Plan Note (Signed)
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

## 2020-06-20 NOTE — Assessment & Plan Note (Signed)
Nonclearing vitreous hemorrhage right eye despite recent intravitreal Avastin right eye.  

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
## 2020-06-22 ENCOUNTER — Encounter (AMBULATORY_SURGERY_CENTER): Payer: BC Managed Care – PPO | Admitting: Ophthalmology

## 2020-06-22 DIAGNOSIS — E113591 Type 2 diabetes mellitus with proliferative diabetic retinopathy without macular edema, right eye: Secondary | ICD-10-CM | POA: Diagnosis not present

## 2020-06-22 DIAGNOSIS — E103591 Type 1 diabetes mellitus with proliferative diabetic retinopathy without macular edema, right eye: Secondary | ICD-10-CM

## 2020-06-22 DIAGNOSIS — H4311 Vitreous hemorrhage, right eye: Secondary | ICD-10-CM | POA: Diagnosis not present

## 2020-06-22 HISTORY — PX: EYE SURGERY: SHX253

## 2020-06-23 ENCOUNTER — Encounter (INDEPENDENT_AMBULATORY_CARE_PROVIDER_SITE_OTHER): Payer: Self-pay | Admitting: Ophthalmology

## 2020-06-23 ENCOUNTER — Other Ambulatory Visit: Payer: Self-pay

## 2020-06-23 ENCOUNTER — Ambulatory Visit (INDEPENDENT_AMBULATORY_CARE_PROVIDER_SITE_OTHER): Payer: BC Managed Care – PPO | Admitting: Ophthalmology

## 2020-06-23 DIAGNOSIS — Z09 Encounter for follow-up examination after completed treatment for conditions other than malignant neoplasm: Secondary | ICD-10-CM | POA: Insufficient documentation

## 2020-06-23 NOTE — Assessment & Plan Note (Signed)
No lifting and bending for 1 week. No water in the eye for 10 days. Do not rub the eye. Wear shield at night for 1-3 days.  Wear your CPAP as normal, if instructed by your doctor.  Continue your topical medications for a total of 3 weeks.  Do not refill your postoperative medications unless instructed.\  Patient to commence with ofloxacin 1 drop right eye 4 times daily and  Prednisolone acetate 1 drop right eye 4 times daily
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
## 2020-06-27 DIAGNOSIS — E11610 Type 2 diabetes mellitus with diabetic neuropathic arthropathy: Secondary | ICD-10-CM | POA: Diagnosis not present

## 2020-06-28 ENCOUNTER — Ambulatory Visit (INDEPENDENT_AMBULATORY_CARE_PROVIDER_SITE_OTHER): Payer: BC Managed Care – PPO | Admitting: Ophthalmology

## 2020-06-28 ENCOUNTER — Encounter (INDEPENDENT_AMBULATORY_CARE_PROVIDER_SITE_OTHER): Payer: Self-pay | Admitting: Ophthalmology

## 2020-06-28 ENCOUNTER — Other Ambulatory Visit: Payer: Self-pay

## 2020-06-28 DIAGNOSIS — H4311 Vitreous hemorrhage, right eye: Secondary | ICD-10-CM | POA: Diagnosis not present

## 2020-06-28 DIAGNOSIS — E113511 Type 2 diabetes mellitus with proliferative diabetic retinopathy with macular edema, right eye: Secondary | ICD-10-CM

## 2020-06-28 NOTE — Assessment & Plan Note (Signed)
Much less active PDR, no active vitreous hemorrhage OD, observe
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
## 2020-06-28 NOTE — Assessment & Plan Note (Signed)
Vitreous hemorrhage right eye entirely cleared,

## 2020-06-30 ENCOUNTER — Encounter (INDEPENDENT_AMBULATORY_CARE_PROVIDER_SITE_OTHER): Payer: BC Managed Care – PPO | Admitting: Ophthalmology

## 2020-07-01 ENCOUNTER — Other Ambulatory Visit: Payer: Self-pay

## 2020-07-01 ENCOUNTER — Ambulatory Visit
Admission: RE | Admit: 2020-07-01 | Discharge: 2020-07-01 | Disposition: A | Discharge status: Home / Self Care | Payer: BC Managed Care – PPO | Source: Ambulatory Visit

## 2020-07-01 DIAGNOSIS — Z1231 Encounter for screening mammogram for malignant neoplasm of breast: Secondary | ICD-10-CM

## 2020-07-06 ENCOUNTER — Ambulatory Visit: Payer: Self-pay | Admitting: General Surgery

## 2020-07-06 DIAGNOSIS — K8050 Calculus of bile duct without cholangitis or cholecystitis without obstruction: Secondary | ICD-10-CM | POA: Diagnosis not present

## 2020-07-06 DIAGNOSIS — Z9884 Bariatric surgery status: Secondary | ICD-10-CM | POA: Diagnosis not present

## 2020-07-06 DIAGNOSIS — K838 Other specified diseases of biliary tract: Secondary | ICD-10-CM | POA: Diagnosis not present

## 2020-07-06 DIAGNOSIS — R1011 Right upper quadrant pain: Secondary | ICD-10-CM | POA: Diagnosis not present
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
## 2020-07-14 DIAGNOSIS — E119 Type 2 diabetes mellitus without complications: Secondary | ICD-10-CM | POA: Diagnosis not present

## 2020-07-26 ENCOUNTER — Encounter: Payer: Self-pay | Admitting: Endocrinology

## 2020-07-26 DIAGNOSIS — E1142 Type 2 diabetes mellitus with diabetic polyneuropathy: Secondary | ICD-10-CM

## 2020-07-26 DIAGNOSIS — Z794 Long term (current) use of insulin: Secondary | ICD-10-CM

## 2020-07-27 MED ORDER — DEXCOM G6 SENSOR MISC: 2 refills | Status: DC

## 2020-07-27 MED ORDER — DEXCOM G6 TRANSMITTER MISC: 1.0000 | 3 refills | Status: DC

## 2020-07-29 NOTE — Patient Instructions (Addendum)
DUE TO COVID-19 ONLY ONE VISITOR IS ALLOWED TO COME WITH YOU AND STAY IN THE WAITING ROOM ONLY DURING PRE OP AND PROCEDURE DAY OF SURGERY. THE 1 VISITOR  MAY VISIT WITH YOU AFTER SURGERY IN YOUR PRIVATE ROOM DURING VISITING HOURS ONLY!  YOU NEED TO HAVE A COVID 19 TEST ON_12/13______ @11 :00_______, THIS TEST MUST BE DONE BEFORE SURGERY,  COVID TESTING SITE [place] [address], IT IS ON THE RIGHT GOING OUT [address] APPROXIMATELY  2 MINUTES PAST [employer] ON THE RIGHT.  ONCE YOUR COVID TEST IS COMPLETED,  PLEASE BEGIN THE QUARANTINE INSTRUCTIONS AS OUTLINED IN YOUR HANDOUT.                Donna Kidd    Your procedure is scheduled on: 08/11/20   Report to [facility] Main  Entrance   Report to admitting at   7:30 AM     Call this number if you have problems the morning of surgery [phone]   No food after midnight.    You may have clear liquid until 6:30 AM.    At 5:00  AM drink pre surgery drink  . Nothing by mouth after 6:30 AM.                                        BRUSH YOUR TEETH MORNING OF SURGERY AND RINSE YOUR MOUTH OUT, NO CHEWING GUM CANDY OR MINTS.     Take these medicines the morning of surgery with A SIP OF WATER: Lyrica, Wellbutrin, Citalopram       How to Manage Your Diabetes Before and After Surgery  Why is it important to control my blood sugar before and after surgery? . Improving blood sugar levels before and after surgery helps healing and can limit problems. . A way of improving blood sugar control is eating a healthy diet by: o  Eating less sugar and carbohydrates o  Increasing activity/exercise o  Talking with your doctor about reaching your blood sugar goals . High blood sugars (greater than 180 mg/dL) can raise your risk of infections and slow your recovery, so you will need to focus on controlling your diabetes during the weeks before surgery. . Make sure that the doctor who takes care of  your diabetes knows about your planned surgery including the date and location.  How do I manage my blood sugar before surgery? . Check your blood sugar at least 4 times a day, starting 2 days before surgery, to make sure that the level is not too high or low. o Check your blood sugar the morning of your surgery when you wake up and every 2 hours until you get to the Short Stay unit. . If your blood sugar is less than 70 mg/dL, you will need to treat for low blood sugar: o Do not take insulin. o Treat a low blood sugar (less than 70 mg/dL) with  cup of clear juice (cranberry or apple), 4 glucose tablets, OR glucose gel. o Recheck blood sugar in 15 minutes after treatment (to make sure it is greater than 70 mg/dL). If your blood sugar is not greater than 70 mg/dL on recheck, call [phone] for further instructions. . Report your blood sugar to the short stay nurse when you get to Short Stay.  . If you are admitted to the hospital after surgery: o Your  blood sugar will be checked by the staff and you will probably be given insulin after surgery (instead of oral diabetes medicines) to make sure you have good blood sugar levels. o The goal for blood sugar control after surgery is 80-180 mg/dL.   WHAT DO I DO ABOUT MY DIABETES MEDICATION?  Marland Kitchen Do not take oral diabetes medicines (pills) the morning of surgery.    . The day of surgery, do not take other diabetes injectables, including Byetta (exenatide), Bydureon (exenatide ER), Victoza (liraglutide), or Trulicity (dulaglutide).      For patients with insulin pumps:  Contact your diabetes doctor for specific instructions before surgery.  Decrease basal rates by 20% at midnight the night before your surgery.  Note that if your surgery is planned to be longer than 2 hours, your insulin pump will be removed and intravenous (IV) insulin will be started and managed by the nurses and the anesthesiologist. You will be able to restart your  insulin pump once you are awake and able to manage it.   Make sure to bring insulin pump supplies to the hospital with you in case the  site needs to be changed.   Pt will call her Dr. To verify what to do about her pump                             You may not have any metal on your body including hair pins and              piercings  Do not wear jewelry, make-up, lotions, powders or perfumes, deodorant             Do not wear nail polish on your fingernails.  Do not shave  48 hours prior to surgery.                Do not bring valuables to the hospital. [place].  Contacts, dentures or bridgework may not be worn into surgery.      Patients discharged the day of surgery will not be allowed to drive home.   IF YOU ARE HAVING SURGERY AND GOING HOME THE SAME DAY, YOU MUST HAVE AN ADULT TO DRIVE YOU HOME AND BE WITH YOU FOR 24 HOURS.   YOU MAY GO HOME BY TAXI OR UBER OR ORTHERWISE, BUT AN ADULT MUST ACCOMPANY YOU HOME AND STAY WITH YOU FOR 24 HOURS.  Name and phone number of your driver:  Special Instructions: N/A              Please read over the following fact sheets you were given: _____________________________________________________________________             [facility] - Preparing for Surgery Before surgery, you can play an important role.   Because skin is not sterile, your skin needs to be as free of germs as possible.   You can reduce the number of germs on your skin by washing with CHG (chlorahexidine gluconate) soap before surgery.   CHG is an antiseptic cleaner which kills germs and bonds with the skin to continue killing germs even after washing.  Please DO NOT use if you have an allergy to CHG or antibacterial soaps.   If your skin becomes reddened/irritated stop using the CHG and inform your nurse when you arrive at Short Stay. Do not  shave (including legs and underarms) for at least 48 hours prior to the first  CHG shower.   Please follow these instructions carefully:  1.  Shower with CHG Soap the night before surgery and the  morning of Surgery.  2.  If you choose to wash your hair, wash your hair first as usual with your  normal  shampoo.  3.  After you shampoo, rinse your hair and body thoroughly to remove the  shampoo.                                        4.  Use CHG as you would any other liquid soap.  You can apply chg directly  to the skin and wash                       Gently with a scrungie or clean washcloth.  5.  Apply the CHG Soap to your body ONLY FROM THE NECK DOWN.   Do not use on face/ open                           Wound or open sores. Avoid contact with eyes, ears mouth and genitals (private parts).                       Wash face,  Genitals (private parts) with your normal soap.             6.  Wash thoroughly, paying special attention to the area where your surgery  will be performed.  7.  Thoroughly rinse your body with warm water from the neck down.  8.  DO NOT shower/wash with your normal soap after using and rinsing off  the CHG Soap.             9.  Pat yourself dry with a clean towel.            10.  Wear clean pajamas.            11.  Place clean sheets on your bed the night of your first shower and do not  sleep with pets. Day of Surgery : Do not apply any lotions/deodorants the morning of surgery.  Please wear clean clothes to the hospital/surgery center.  FAILURE TO FOLLOW THESE INSTRUCTIONS MAY RESULT IN THE CANCELLATION OF YOUR SURGERY PATIENT SIGNATURE_________________________________  NURSE SIGNATURE__________________________________  ________________________________________________________________________

## 2020-08-01 ENCOUNTER — Other Ambulatory Visit: Payer: Self-pay

## 2020-08-01 ENCOUNTER — Encounter (HOSPITAL_COMMUNITY): Payer: Self-pay

## 2020-08-01 ENCOUNTER — Encounter (HOSPITAL_COMMUNITY)
Admission: RE | Admit: 2020-08-01 | Discharge: 2020-08-01 | Disposition: A | Discharge status: Home / Self Care | Payer: BC Managed Care – PPO | Source: Ambulatory Visit | Attending: General Surgery | Admitting: General Surgery

## 2020-08-01 ENCOUNTER — Telehealth: Payer: Self-pay | Admitting: *Deleted

## 2020-08-01 DIAGNOSIS — E119 Type 2 diabetes mellitus without complications: Secondary | ICD-10-CM | POA: Insufficient documentation

## 2020-08-01 DIAGNOSIS — Z01818 Encounter for other preprocedural examination: Secondary | ICD-10-CM | POA: Insufficient documentation

## 2020-08-01 LAB — CBC
HCT: 38.9 % (ref 36.0–46.0)
Hemoglobin: 12.2 g/dL (ref 12.0–15.0)
MCH: 26.9 pg (ref 26.0–34.0)
MCHC: 31.4 g/dL (ref 30.0–36.0)
MCV: 85.7 fL (ref 80.0–100.0)
Platelets: 318 10*3/uL (ref 150–400)
RBC: 4.54 MIL/uL (ref 3.87–5.11)
RDW: 16.3 % — ABNORMAL HIGH (ref 11.5–15.5)
WBC: 8.3 10*3/uL (ref 4.0–10.5)
nRBC: 0 % (ref 0.0–0.2)

## 2020-08-01 LAB — BASIC METABOLIC PANEL
Anion gap: 11 (ref 5–15)
BUN: 16 mg/dL (ref 6–20)
CO2: 26 mmol/L (ref 22–32)
Calcium: 9.4 mg/dL (ref 8.9–10.3)
Chloride: 102 mmol/L (ref 98–111)
Creatinine, Ser: 0.8 mg/dL (ref 0.44–1.00)
GFR, Estimated: >60 mL/min (ref >60)
Glucose, Bld: 105 mg/dL — ABNORMAL HIGH (ref 70–99)
Potassium: 4.4 mmol/L (ref 3.5–5.1)
Sodium: 139 mmol/L (ref 135–145)

## 2020-08-01 NOTE — Telephone Encounter (Signed)
Called patient, notified of MD message.   Patient verbalized understanding will call preop nurse.

## 2020-08-01 NOTE — Telephone Encounter (Signed)
Take off pump AM of surgery. Hospital will manage insulin day of surgery Resume pump next day

## 2020-08-01 NOTE — Telephone Encounter (Signed)
Please advise. Thank you

## 2020-08-01 NOTE — Progress Notes (Addendum)
COVID Vaccine Completed:Yes Date COVID Vaccine completed:12/05/19 COVID vaccine manufacturer: [place]      PCP - Dr. Loanne Drilling Cardiologist - none  Chest x-ray - no EKG - 08/01/20-Chart and Epic Stress Test - 01/27/2015-Epic ECHO - 01/05/14-Epic Cardiac Cath - no Pacemaker/ICD device last checked:NA  Sleep Study - no CPAP -   Fasting Blood Sugar - 98-120 Checks Blood Sugar _____ times a day continuous  Blood Thinner Instructions:NA Aspirin Instructions: Last Dose:  Anesthesia review:   Patient denies shortness of breath, fever, cough and chest pain at PAT appointment yes   Patient verbalized understanding of instructions that were given to them at the PAT appointment. Patient was also instructed that they will need to review over the PAT instructions again at home before surgery.Yes.  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 She sometimes gets SOB doing housework but not with ADLs.  She uses a V-go 20 Insulin pump that she changes everyday. I called diabetic coordinator x 2 but the Pt was able to call  Dr. Loanne Drilling endocranologist who told her to remove the pump the morning of surgery and replace it when she gets home.  This is what she has done for all of her surgeries without complication.

## 2020-08-01 NOTE — Telephone Encounter (Signed)
Patient called stating she is getting ready to have gall bladder surgery and they need to know if she needs to wear her insulin pump or not. [phone]   Please leave a detailed message if she does not answer she is currently doing her pre admission testing

## 2020-08-03 DIAGNOSIS — M549 Dorsalgia, unspecified: Secondary | ICD-10-CM | POA: Diagnosis not present

## 2020-08-03 DIAGNOSIS — I10 Essential (primary) hypertension: Secondary | ICD-10-CM | POA: Diagnosis not present

## 2020-08-03 DIAGNOSIS — E11610 Type 2 diabetes mellitus with diabetic neuropathic arthropathy: Secondary | ICD-10-CM | POA: Diagnosis not present

## 2020-08-03 DIAGNOSIS — M25511 Pain in right shoulder: Secondary | ICD-10-CM | POA: Diagnosis not present

## 2020-08-03 DIAGNOSIS — G894 Chronic pain syndrome: Secondary | ICD-10-CM | POA: Diagnosis not present

## 2020-08-08 ENCOUNTER — Other Ambulatory Visit (HOSPITAL_COMMUNITY)
Admission: RE | Admit: 2020-08-08 | Discharge: 2020-08-08 | Disposition: A | Discharge status: Home / Self Care | Payer: BC Managed Care – PPO | Source: Ambulatory Visit | Attending: General Surgery | Admitting: General Surgery

## 2020-08-08 DIAGNOSIS — Z888 Allergy status to other drugs, medicaments and biological substances status: Secondary | ICD-10-CM | POA: Diagnosis not present

## 2020-08-08 DIAGNOSIS — Z794 Long term (current) use of insulin: Secondary | ICD-10-CM | POA: Diagnosis not present

## 2020-08-08 DIAGNOSIS — K811 Chronic cholecystitis: Secondary | ICD-10-CM | POA: Diagnosis not present

## 2020-08-08 DIAGNOSIS — E669 Obesity, unspecified: Secondary | ICD-10-CM | POA: Diagnosis not present

## 2020-08-08 DIAGNOSIS — Z20822 Contact with and (suspected) exposure to covid-19: Secondary | ICD-10-CM | POA: Insufficient documentation

## 2020-08-08 DIAGNOSIS — I10 Essential (primary) hypertension: Secondary | ICD-10-CM | POA: Diagnosis not present

## 2020-08-08 DIAGNOSIS — Z01812 Encounter for preprocedural laboratory examination: Secondary | ICD-10-CM | POA: Insufficient documentation

## 2020-08-08 DIAGNOSIS — Z7984 Long term (current) use of oral hypoglycemic drugs: Secondary | ICD-10-CM | POA: Diagnosis not present

## 2020-08-08 DIAGNOSIS — Z881 Allergy status to other antibiotic agents status: Secondary | ICD-10-CM | POA: Diagnosis not present

## 2020-08-08 DIAGNOSIS — R1011 Right upper quadrant pain: Secondary | ICD-10-CM | POA: Diagnosis not present

## 2020-08-08 DIAGNOSIS — E11610 Type 2 diabetes mellitus with diabetic neuropathic arthropathy: Secondary | ICD-10-CM | POA: Diagnosis not present

## 2020-08-08 LAB — SARS CORONAVIRUS 2 (TAT 6-24 HRS): SARS Coronavirus 2: NEGATIVE

## 2020-08-10 DIAGNOSIS — I10 Essential (primary) hypertension: Secondary | ICD-10-CM | POA: Diagnosis not present

## 2020-08-10 DIAGNOSIS — M79672 Pain in left foot: Secondary | ICD-10-CM | POA: Diagnosis not present

## 2020-08-11 ENCOUNTER — Ambulatory Visit (HOSPITAL_COMMUNITY): Payer: BC Managed Care – PPO | Admitting: Anesthesiology

## 2020-08-11 ENCOUNTER — Encounter: Payer: BC Managed Care – PPO | Admitting: Family Medicine

## 2020-08-11 ENCOUNTER — Other Ambulatory Visit: Payer: Self-pay

## 2020-08-11 ENCOUNTER — Encounter (HOSPITAL_COMMUNITY)
Admission: RE | Disposition: A | Discharge status: Home / Self Care | Payer: Self-pay | Source: Other Acute Inpatient Hospital | Attending: General Surgery

## 2020-08-11 ENCOUNTER — Ambulatory Visit (HOSPITAL_COMMUNITY)
Admission: RE | Admit: 2020-08-11 | Discharge: 2020-08-11 | Disposition: A | Discharge status: Home / Self Care | Payer: BC Managed Care – PPO | Source: Other Acute Inpatient Hospital | Attending: General Surgery | Admitting: General Surgery

## 2020-08-11 ENCOUNTER — Encounter (HOSPITAL_COMMUNITY): Payer: Self-pay | Admitting: General Surgery

## 2020-08-11 DIAGNOSIS — Z881 Allergy status to other antibiotic agents status: Secondary | ICD-10-CM | POA: Diagnosis not present

## 2020-08-11 DIAGNOSIS — E669 Obesity, unspecified: Secondary | ICD-10-CM | POA: Insufficient documentation

## 2020-08-11 DIAGNOSIS — Z794 Long term (current) use of insulin: Secondary | ICD-10-CM | POA: Diagnosis not present

## 2020-08-11 DIAGNOSIS — K811 Chronic cholecystitis: Secondary | ICD-10-CM | POA: Insufficient documentation

## 2020-08-11 DIAGNOSIS — I10 Essential (primary) hypertension: Secondary | ICD-10-CM | POA: Insufficient documentation

## 2020-08-11 DIAGNOSIS — R1011 Right upper quadrant pain: Secondary | ICD-10-CM | POA: Diagnosis not present

## 2020-08-11 DIAGNOSIS — Z7984 Long term (current) use of oral hypoglycemic drugs: Secondary | ICD-10-CM | POA: Diagnosis not present

## 2020-08-11 DIAGNOSIS — E11610 Type 2 diabetes mellitus with diabetic neuropathic arthropathy: Secondary | ICD-10-CM | POA: Insufficient documentation

## 2020-08-11 DIAGNOSIS — Z20822 Contact with and (suspected) exposure to covid-19: Secondary | ICD-10-CM | POA: Insufficient documentation

## 2020-08-11 DIAGNOSIS — Z888 Allergy status to other drugs, medicaments and biological substances status: Secondary | ICD-10-CM | POA: Insufficient documentation

## 2020-08-11 DIAGNOSIS — F418 Other specified anxiety disorders: Secondary | ICD-10-CM | POA: Diagnosis not present

## 2020-08-11 DIAGNOSIS — E785 Hyperlipidemia, unspecified: Secondary | ICD-10-CM | POA: Diagnosis not present

## 2020-08-11 DIAGNOSIS — K8044 Calculus of bile duct with chronic cholecystitis without obstruction: Secondary | ICD-10-CM | POA: Diagnosis not present

## 2020-08-11 DIAGNOSIS — K8020 Calculus of gallbladder without cholecystitis without obstruction: Secondary | ICD-10-CM

## 2020-08-11 HISTORY — PX: CHOLECYSTECTOMY: SHX55

## 2020-08-11 LAB — GLUCOSE, CAPILLARY
Glucose-Capillary: 118 mg/dL — ABNORMAL HIGH (ref 70–99)
Glucose-Capillary: 222 mg/dL — ABNORMAL HIGH (ref 70–99)

## 2020-08-11 SURGERY — LAPAROSCOPIC CHOLECYSTECTOMY WITH INTRAOPERATIVE CHOLANGIOGRAM
Anesthesia: General | Site: Abdomen

## 2020-08-11 MED ORDER — GLYCOPYRROLATE PF 0.2 MG/ML IJ SOSY
PREFILLED_SYRINGE | INTRAMUSCULAR | Status: AC
  Filled 2020-08-11: qty 1

## 2020-08-11 MED ORDER — EPHEDRINE 5 MG/ML INJ
INTRAVENOUS | Status: AC
  Filled 2020-08-11: qty 30

## 2020-08-11 MED ORDER — MIDAZOLAM HCL 2 MG/2ML IJ SOLN
INTRAMUSCULAR | Status: AC
  Filled 2020-08-11: qty 2

## 2020-08-11 MED ORDER — RINGERS IRRIGATION IR SOLN
Status: DC | PRN
  Administered 2020-08-11: 1000 mL

## 2020-08-11 MED ORDER — ORAL CARE MOUTH RINSE: 15.0000 mL | Freq: Once | OROMUCOSAL | Status: AC

## 2020-08-11 MED ORDER — SUCCINYLCHOLINE CHLORIDE 200 MG/10ML IV SOSY
PREFILLED_SYRINGE | INTRAVENOUS | Status: AC
  Filled 2020-08-11: qty 10

## 2020-08-11 MED ORDER — BUPIVACAINE HCL 0.5 % IJ SOLN
INTRAMUSCULAR | Status: DC | PRN
  Administered 2020-08-11: 25 mL

## 2020-08-11 MED ORDER — SODIUM CHLORIDE 0.9 % IV SOLN
2.0000 g | INTRAVENOUS | Status: DC
  Filled 2020-08-11: qty 2

## 2020-08-11 MED ORDER — MIDAZOLAM HCL 2 MG/2ML IJ SOLN
INTRAMUSCULAR | Status: DC | PRN
  Administered 2020-08-11: 2 mg via INTRAVENOUS

## 2020-08-11 MED ORDER — SCOPOLAMINE 1 MG/3DAYS TD PT72
1.0000 | MEDICATED_PATCH | TRANSDERMAL | Status: DC
  Administered 2020-08-11: 1.5 mg via TRANSDERMAL
  Filled 2020-08-11: qty 1

## 2020-08-11 MED ORDER — FENTANYL CITRATE (PF) 250 MCG/5ML IJ SOLN
INTRAMUSCULAR | Status: DC | PRN
  Administered 2020-08-11 (×4): 50 ug via INTRAVENOUS
  Administered 2020-08-11: 75 ug via INTRAVENOUS
  Administered 2020-08-11: 50 ug via INTRAVENOUS
  Administered 2020-08-11: 125 ug via INTRAVENOUS

## 2020-08-11 MED ORDER — FENTANYL CITRATE (PF) 100 MCG/2ML IJ SOLN
INTRAMUSCULAR | Status: AC
  Filled 2020-08-11: qty 2

## 2020-08-11 MED ORDER — SUGAMMADEX SODIUM 500 MG/5ML IV SOLN
INTRAVENOUS | Status: DC | PRN
  Administered 2020-08-11: 300 mg via INTRAVENOUS

## 2020-08-11 MED ORDER — LIDOCAINE 20MG/ML (2%) 15 ML SYRINGE OPTIME
INTRAMUSCULAR | Status: DC | PRN
  Administered 2020-08-11: 1 mg/kg/h via INTRAVENOUS

## 2020-08-11 MED ORDER — KETOROLAC TROMETHAMINE 30 MG/ML IJ SOLN
30.0000 mg | Freq: Once | INTRAMUSCULAR | Status: AC | PRN
  Administered 2020-08-11: 30 mg via INTRAVENOUS

## 2020-08-11 MED ORDER — FENTANYL CITRATE (PF) 250 MCG/5ML IJ SOLN
INTRAMUSCULAR | Status: AC
  Filled 2020-08-11: qty 5

## 2020-08-11 MED ORDER — ONDANSETRON HCL 4 MG/2ML IJ SOLN
INTRAMUSCULAR | Status: AC
  Filled 2020-08-11: qty 4

## 2020-08-11 MED ORDER — KETOROLAC TROMETHAMINE 30 MG/ML IJ SOLN
INTRAMUSCULAR | Status: AC
  Filled 2020-08-11: qty 1

## 2020-08-11 MED ORDER — ONDANSETRON HCL 4 MG/2ML IJ SOLN
INTRAMUSCULAR | Status: DC | PRN
  Administered 2020-08-11: 4 mg via INTRAVENOUS

## 2020-08-11 MED ORDER — LIDOCAINE 2% (20 MG/ML) 5 ML SYRINGE
INTRAMUSCULAR | Status: DC | PRN
  Administered 2020-08-11: 100 mg via INTRAVENOUS

## 2020-08-11 MED ORDER — DEXAMETHASONE SODIUM PHOSPHATE 10 MG/ML IJ SOLN
INTRAMUSCULAR | Status: AC
  Filled 2020-08-11: qty 2

## 2020-08-11 MED ORDER — PHENYLEPHRINE 40 MCG/ML (10ML) SYRINGE FOR IV PUSH (FOR BLOOD PRESSURE SUPPORT)
PREFILLED_SYRINGE | INTRAVENOUS | Status: AC
  Filled 2020-08-11: qty 10

## 2020-08-11 MED ORDER — PROPOFOL 10 MG/ML IV BOLUS
INTRAVENOUS | Status: AC
  Filled 2020-08-11: qty 20

## 2020-08-11 MED ORDER — LIDOCAINE HCL (PF) 2 % IJ SOLN
INTRAMUSCULAR | Status: AC
  Filled 2020-08-11: qty 10

## 2020-08-11 MED ORDER — DEXAMETHASONE SODIUM PHOSPHATE 10 MG/ML IJ SOLN
INTRAMUSCULAR | Status: DC | PRN
  Administered 2020-08-11: 6 mg via INTRAVENOUS

## 2020-08-11 MED ORDER — SUGAMMADEX SODIUM 500 MG/5ML IV SOLN
INTRAVENOUS | Status: AC
  Filled 2020-08-11: qty 5

## 2020-08-11 MED ORDER — FENTANYL CITRATE (PF) 100 MCG/2ML IJ SOLN
25.0000 ug | INTRAMUSCULAR | Status: DC | PRN
  Administered 2020-08-11 (×2): 50 ug via INTRAVENOUS

## 2020-08-11 MED ORDER — CHLORHEXIDINE GLUCONATE CLOTH 2 % EX PADS: 6.0000 | MEDICATED_PAD | Freq: Once | CUTANEOUS | Status: DC

## 2020-08-11 MED ORDER — ROCURONIUM BROMIDE 10 MG/ML (PF) SYRINGE
PREFILLED_SYRINGE | INTRAVENOUS | Status: DC | PRN
  Administered 2020-08-11: 70 mg via INTRAVENOUS
  Administered 2020-08-11 (×2): 10 mg via INTRAVENOUS

## 2020-08-11 MED ORDER — ROCURONIUM BROMIDE 10 MG/ML (PF) SYRINGE
PREFILLED_SYRINGE | INTRAVENOUS | Status: AC
  Filled 2020-08-11: qty 20

## 2020-08-11 MED ORDER — PROPOFOL 10 MG/ML IV BOLUS
INTRAVENOUS | Status: DC | PRN
  Administered 2020-08-11: 150 mg via INTRAVENOUS

## 2020-08-11 MED ORDER — ACETAMINOPHEN 500 MG PO TABS
1000.0000 mg | ORAL_TABLET | ORAL | Status: AC
  Administered 2020-08-11: 1000 mg via ORAL
  Filled 2020-08-11: qty 2

## 2020-08-11 MED ORDER — BUPIVACAINE-EPINEPHRINE 0.5% -1:200000 IJ SOLN
INTRAMUSCULAR | Status: AC
  Filled 2020-08-11: qty 1

## 2020-08-11 MED ORDER — ALBUMIN HUMAN 5 % IV SOLN
INTRAVENOUS | Status: AC
  Filled 2020-08-11: qty 500

## 2020-08-11 MED ORDER — CHLORHEXIDINE GLUCONATE 0.12 % MT SOLN
15.0000 mL | Freq: Once | OROMUCOSAL | Status: AC
  Administered 2020-08-11: 15 mL via OROMUCOSAL

## 2020-08-11 MED ORDER — LACTATED RINGERS IV SOLN: INTRAVENOUS | Status: DC

## 2020-08-11 MED ORDER — DEXAMETHASONE SODIUM PHOSPHATE 4 MG/ML IJ SOLN: 4.0000 mg | INTRAMUSCULAR | Status: DC

## 2020-08-11 SURGICAL SUPPLY — 57 items
ADH SKN CLS APL DERMABOND .7 (GAUZE/BANDAGES/DRESSINGS) ×1
APL PRP STRL LF DISP 70% ISPRP (MISCELLANEOUS) ×1
APL SKNCLS STERI-STRIP NONHPOA (GAUZE/BANDAGES/DRESSINGS)
APL SRG 38 LTWT LNG FL B (MISCELLANEOUS)
APPLICATOR ARISTA FLEXITIP XL (MISCELLANEOUS) IMPLANT
APPLIER CLIP 5 13 M/L LIGAMAX5 (MISCELLANEOUS)
APPLIER CLIP ROT 10 11.4 M/L (STAPLE)
APR CLP MED LRG 11.4X10 (STAPLE)
APR CLP MED LRG 5 ANG JAW (MISCELLANEOUS)
BAG SPEC RTRVL 10 TROC 200 (ENDOMECHANICALS) ×1
BENZOIN TINCTURE PRP APPL 2/3 (GAUZE/BANDAGES/DRESSINGS) IMPLANT
BNDG ADH 1X3 SHEER STRL LF (GAUZE/BANDAGES/DRESSINGS) ×4 IMPLANT
BNDG ADH THN 3X1 STRL LF (GAUZE/BANDAGES/DRESSINGS)
CABLE HIGH FREQUENCY MONO STRZ (ELECTRODE) ×2 IMPLANT
CHLORAPREP W/TINT 26 (MISCELLANEOUS) ×2 IMPLANT
CLIP APPLIE 5 13 M/L LIGAMAX5 (MISCELLANEOUS) IMPLANT
CLIP APPLIE ROT 10 11.4 M/L (STAPLE) IMPLANT
CLIP VESOLOCK MED LG 6/CT (CLIP) IMPLANT
COVER MAYO STAND STRL (DRAPES) ×1 IMPLANT
COVER SURGICAL LIGHT HANDLE (MISCELLANEOUS) ×2 IMPLANT
COVER WAND RF STERILE (DRAPES) ×1 IMPLANT
DECANTER SPIKE VIAL GLASS SM (MISCELLANEOUS) ×2 IMPLANT
DERMABOND ADVANCED (GAUZE/BANDAGES/DRESSINGS) ×1
DERMABOND ADVANCED .7 DNX12 (GAUZE/BANDAGES/DRESSINGS) IMPLANT
DRAPE C-ARM 42X120 X-RAY (DRAPES) ×1 IMPLANT
DRSG TEGADERM 2-3/8X2-3/4 SM (GAUZE/BANDAGES/DRESSINGS) IMPLANT
ELECT REM PT RETURN 15FT ADLT (MISCELLANEOUS) ×2 IMPLANT
GAUZE SPONGE 2X2 8PLY STRL LF (GAUZE/BANDAGES/DRESSINGS) IMPLANT
GLOVE BIO SURGEON STRL SZ7.5 (GLOVE) ×2 IMPLANT
GLOVE INDICATOR 8.0 STRL GRN (GLOVE) ×2 IMPLANT
GOWN STRL REUS W/TWL XL LVL3 (GOWN DISPOSABLE) ×6 IMPLANT
GRASPER SUT TROCAR 14GX15 (MISCELLANEOUS) IMPLANT
HEMOSTAT ARISTA ABSORB 3G PWDR (HEMOSTASIS) IMPLANT
HEMOSTAT SNOW SURGICEL 2X4 (HEMOSTASIS) IMPLANT
KIT BASIN OR (CUSTOM PROCEDURE TRAY) ×2 IMPLANT
KIT TURNOVER KIT A (KITS) IMPLANT
L-HOOK LAP DISP 36CM (ELECTROSURGICAL) ×2
LHOOK LAP DISP 36CM (ELECTROSURGICAL) IMPLANT
POUCH RETRIEVAL ECOSAC 10 (ENDOMECHANICALS) ×1 IMPLANT
POUCH RETRIEVAL ECOSAC 10MM (ENDOMECHANICALS) ×2
SCISSORS LAP 5X35 DISP (ENDOMECHANICALS) ×2 IMPLANT
SET CHOLANGIOGRAPH MIX (MISCELLANEOUS) ×1 IMPLANT
SET IRRIG TUBING LAPAROSCOPIC (IRRIGATION / IRRIGATOR) ×2 IMPLANT
SET TUBE SMOKE EVAC HIGH FLOW (TUBING) ×2 IMPLANT
SLEEVE XCEL OPT CAN 5 100 (ENDOMECHANICALS) ×4 IMPLANT
SPONGE GAUZE 2X2 STER 10/PKG (GAUZE/BANDAGES/DRESSINGS)
STRIP CLOSURE SKIN 1/2X4 (GAUZE/BANDAGES/DRESSINGS) IMPLANT
SUT MNCRL AB 4-0 PS2 18 (SUTURE) ×2 IMPLANT
SUT VIC AB 0 UR5 27 (SUTURE) IMPLANT
SUT VICRYL 0 TIES 12 18 (SUTURE) IMPLANT
SUT VICRYL 0 UR6 27IN ABS (SUTURE) IMPLANT
TOWEL OR 17X26 10 PK STRL BLUE (TOWEL DISPOSABLE) ×2 IMPLANT
TOWEL OR NON WOVEN STRL DISP B (DISPOSABLE) ×2 IMPLANT
TRAY LAPAROSCOPIC (CUSTOM PROCEDURE TRAY) ×2 IMPLANT
TROCAR BLADELESS OPT 5 100 (ENDOMECHANICALS) ×2 IMPLANT
TROCAR XCEL BLUNT TIP 100MML (ENDOMECHANICALS) IMPLANT
TROCAR XCEL NON-BLD 11X100MML (ENDOMECHANICALS) ×1 IMPLANT

## 2020-08-11 NOTE — Anesthesia Procedure Notes (Signed)
Date/Time: 08/11/2020 10:47 AM Performed by: Cynda Familia, CRNA Oxygen Delivery Method: Simple face mask Placement Confirmation: positive ETCO2 and breath sounds checked- equal and bilateral Dental Injury: Teeth and Oropharynx as per pre-operative assessment

## 2020-08-11 NOTE — Anesthesia Postprocedure Evaluation (Signed)
Anesthesia Post Note  Patient: Donna Kidd  Procedure(s) Performed: LAPAROSCOPIC CHOLECYSTECTOMY (N/A Abdomen)     Patient location during evaluation: PACU Anesthesia Type: General Level of consciousness: awake and alert Pain management: pain level controlled Vital Signs Assessment: post-procedure vital signs reviewed and stable Respiratory status: spontaneous breathing, nonlabored ventilation, respiratory function stable and patient connected to nasal cannula oxygen Cardiovascular status: blood pressure returned to baseline and stable Postop Assessment: no apparent nausea or vomiting Anesthetic complications: no   No complications documented.  Last Vitals:  Vitals:   08/11/20 1130 08/11/20 1157  BP: (!) 148/77 (!) 163/79  Pulse: 80 76  Resp: 12 14  Temp: 36.5 C 36.8 C  SpO2: 96% 95%    Last Pain:  Vitals:   08/11/20 1157  TempSrc: Oral  PainSc:                  Aliani Caccavale S

## 2020-08-11 NOTE — Discharge Instructions (Signed)
[facility] LAPAROSCOPIC [facility]: POST OP INSTRUCTIONS Always review your discharge instruction sheet given to you by the facility where your surgery was performed. IF YOU HAVE DISABILITY OR FAMILY LEAVE FORMS, YOU MUST BRING THEM TO THE OFFICE FOR PROCESSING.   DO NOT GIVE THEM TO YOUR DOCTOR.  PAIN CONTROL  1. First take acetaminophen (Tylenol) to control your pain after surgery.  Follow directions on package.  Taking acetaminophen (Tylenol)  regularly after surgery will help to control your pain and lower the amount of prescription pain medication you may need.  You should not take more than 3,000 mg (3 grams) of acetaminophen (Tylenol) in 24 hours from ALL sources.  You should not take ibuprofen (Advil), aleve, motrin, naprosyn or other NSAIDS if you have a history of stomach ulcers or chronic kidney disease.  2. A prescription for pain medication may be given to you upon discharge.  Take your pain medication as prescribed, if you still have uncontrolled pain after taking acetaminophen (Tylenol) or ibuprofen (Advil). 3. Use ice packs to help control pain. 4. If you need a refill on your pain medication, please contact your pharmacy.  They will contact our office to request authorization. Prescriptions will not be filled after 5pm or on week-ends.  HOME MEDICATIONS 5. Take your usually prescribed medications unless otherwise directed.  DIET 6. You should follow a light diet the first few days after arrival home.  Be sure to include lots of fluids daily. Avoid fatty, fried foods.   CONSTIPATION 7. It is common to experience some constipation after surgery and if you are taking pain medication.  Increasing fluid intake and taking a stool softener (such as Colace) will usually help or prevent this problem from occurring.  A mild laxative (Milk of Magnesia or Miralax) should be taken according to package instructions if there are no bowel movements after 48  hours.  WOUND/INCISION CARE 8. Most patients will experience some swelling and bruising in the area of the incisions.  Ice packs will help.  Swelling and bruising can take several days to resolve.  9. Unless discharge instructions indicate otherwise, follow guidelines below  a. STERI-STRIPS - you may remove your outer bandages 48 hours after surgery, and you may shower at that time.  You have steri-strips (small skin tapes) in place directly over the incision.  These strips should be left on the skin for 7-10 days.   b. DERMABOND/SKIN GLUE - you may shower in 24 hours.  The glue will flake off over the next 2-3 weeks. 10. Any sutures or staples will be removed at the office during your follow-up visit.  ACTIVITIES 11. You may resume regular (light) daily activities beginning the next day--such as daily self-care, walking, climbing stairs--gradually increasing activities as tolerated.  You may have sexual intercourse when it is comfortable.  Refrain from any heavy lifting or straining until approved by your doctor. a. You may drive when you are no longer taking prescription pain medication, you can comfortably wear a seatbelt, and you can safely maneuver your car and apply brakes.  FOLLOW-UP 12. You should see your doctor in the office for a follow-up appointment approximately 2-3 weeks after your surgery.  You should have been given your post-op/follow-up appointment when your surgery was scheduled.  If you did not receive a post-op/follow-up appointment, make sure that you call for this appointment within a day or two after you arrive home to insure a convenient appointment time.  OTHER INSTRUCTIONS 13.  Do not lift/push/pull anything greater than 15 lbs x 2 weeks  WHEN TO CALL YOUR DOCTOR: 1. Fever over 101.0 2. Inability to urinate 3. Continued bleeding from incision. 4. Increased pain, redness, or drainage from the incision. 5. Increasing abdominal pain  The clinic staff is available to  answer your questions during regular business hours.  Please don't hesitate to call and ask to speak to one of the nurses for clinical concerns.  If you have a medical emergency, go to the nearest emergency room or call 911.  A surgeon from [facility] is always on call at the hospital. [address], [place], [place], [place]  [address] ? [address], [place], [place]   [address] [phone] ? [phone] ? FAX [phone] Web site: [email or web address]

## 2020-08-11 NOTE — Anesthesia Preprocedure Evaluation (Addendum)
Anesthesia Evaluation  Patient identified by MRN, date of birth, ID band Patient awake    Reviewed: Allergy & Precautions, H&P , NPO status , Patient's Chart, lab work & pertinent test results  Airway Mallampati: II  TM Distance: >3 FB Neck ROM: Full    Dental no notable dental hx. (+) Dental Advisory Given   Pulmonary neg pulmonary ROS,    Pulmonary exam normal breath sounds clear to auscultation       Cardiovascular hypertension, Normal cardiovascular exam Rhythm:Regular Rate:Normal     Neuro/Psych negative neurological ROS  negative psych ROS   GI/Hepatic negative GI ROS, Neg liver ROS,   Endo/Other  diabetes, Insulin DependentMorbid obesity  Renal/GU negative Renal ROS  negative genitourinary   Musculoskeletal negative musculoskeletal ROS (+)   Abdominal   Peds negative pediatric ROS (+)  Hematology negative hematology ROS (+)   Anesthesia Other Findings   Reproductive/Obstetrics negative OB ROS                            Anesthesia Physical Anesthesia Plan  ASA: III  Anesthesia Plan: General   Post-op Pain Management:    Induction: Intravenous  PONV Risk Score and Plan: 4 or greater and Ondansetron, Dexamethasone, Droperidol, Midazolam, Treatment may vary due to age or medical condition and Scopolamine patch - Pre-op  Airway Management Planned: Oral ETT  Additional Equipment:   Intra-op Plan:   Post-operative Plan: Extubation in OR  Informed Consent: I have reviewed the patients History and Physical, chart, labs and discussed the procedure including the risks, benefits and alternatives for the proposed anesthesia with the patient or authorized representative who has indicated his/her understanding and acceptance.     Dental advisory given  Plan Discussed with: CRNA and Surgeon  Anesthesia Plan Comments:         Anesthesia Quick Evaluation

## 2020-08-11 NOTE — Op Note (Addendum)
Laparoscopic Cholecystectomy Procedure Note  Indications: 54 year old female presenting status-post bariatric surgery. She comes in for long-term follow-up mainly regarding a new problem.  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  We last saw her in September 2017.  She is referred by Dr. Loletha Carrow.  She states she has had a several month history of intermittent epigastric and right upper quadrant pain radiating to her side.  It doesn't necessarily occur after eating.  The intensity will last for several hours but then she will feel bad for generally the rest of the day.  She has some occasional nausea and bloating with it.  There is no particular pattern to it.  She denies NSAID use.  She saw her gastroenterologist and they performed an upper endoscopy which was normal.  No marginal ulcer.  He ordered an ultrasound which showed biliary sludge.  There was concerns for a liver lesion so this was followed by an MRI which revealed no hepatic lesion just sludge in the gallbladder.   Pre-operative Diagnosis: Biliary colic  Post-operative Diagnosis: Same  Surgeon: Greer Pickerel, MD  Assistants: Sheria Lang, MD  Anesthesia: General endotracheal anesthesia  ASA Class: 2  Procedure Details  The patient was seen again in the Holding Room. The risks, benefits, complications, treatment options, and expected outcomes were discussed with the patient. The possibilities of reaction to medication, pulmonary aspiration, perforation of viscus, bleeding, recurrent infection, finding a normal gallbladder, the need for additional procedures, failure to diagnose a condition, the possible need to convert to an open procedure, and creating a complication requiring transfusion or operation were discussed with the patient. The likelihood of improving the patient's symptoms with return to their baseline status is good.  The patient and/or family concurred with the proposed plan, giving informed  consent. The site of surgery properly noted. The patient was taken to Operating Room, identified as Madai Nuccio and the procedure verified as Laparoscopic Cholecystectomy. A Time Out was held and the above information confirmed.  Prior to the induction of general anesthesia, antibiotic prophylaxis was administered. General endotracheal anesthesia was then administered and tolerated well. After the induction, the abdomen was prepped with Chloraprep and draped in the sterile fashion. The patient was positioned in the supine position.  Local anesthetic agent was injected into the skin near Palmer's point in the left upper quadrant. A 5 mm incision was made. We cannulated the peritoneal cavity with an optical trocar under direct vision. We dissected down to the abdominal fascia with blunt dissection. Pneumoperitoneum was then created with CO2 and tolerated well without any adverse changes in the patient's vital signs. An 11-mm port was placed in the periumbilical position.  Two 5-mm ports were placed in the right upper quadrant. All skin incisions were infiltrated with a local anesthetic agent before making the incision and placing the trocars.   We positioned the patient in reverse Trendelenburg, tilted slightly to the patient's left.  The gallbladder was identified. The gallbladder was very distended with bile so we aspirated it in order to facilitate retraction.  the fundus grasped and retracted cephalad. Adhesions were lysed bluntly and with the electrocautery where indicated, taking care not to injure any adjacent organs or viscus. The infundibulum was grasped and retracted laterally, exposing the peritoneum overlying the triangle of Calot. This was then divided and exposed in a blunt fashion. The cystic duct was clearly identified and bluntly dissected circumferentially. The cystic duct was ligated with a clip distally.  An incision was made in the cystic duct and the [facility] cholangiogram catheter  introduced into the peritoneal cavity. There was drainage of bile from the opening in the cystic duct but Unfortunately, multiple attempts at passing the cholangiocatheter were unsuccessful, so the cholangiogram was abandoned so as not to injured the cystic duct stump. The catheter was then removed.   The cystic duct was then ligated with clips and divided. The cystic artery was identified and dissected free. Three separate vessels were observed to enter the gallbladder as branches, and these were dissected, clipped, and ligated individually near the gallbladder wall. The ongoing artery was allowed to fall away from the gallbladder.   The gallbladder was dissected from the liver bed in retrograde fashion with the electrocautery. The gallbladder was removed and placed in an Eco sac. The liver bed was irrigated and inspected. Hemostasis was achieved with the electrocautery and a piece of Surgicel topical hemostatic material. Copious irrigation was utilized and was repeatedly aspirated until clear.  The gallbladder and Ecco sac were then removed through the periuumbilical port site. The fascial defect at this site was closed with two vicryl sutures with the aid of a laparoscopic suture passing device.     We again inspected the right upper quadrant for hemostasis.  Pneumoperitoneum was released as we removed the trocars.  4-0 Monocryl was used to close the skin.  Dermabond was applied. The patient was then extubated and brought to the recovery room in stable condition. Instrument, sponge, and needle counts were correct at closure and at the conclusion of the case.   Findings: Chronic cholecystitis   Estimated Blood Loss: Minimal         Drains: None         Specimens: Gallbladder           Complications: None; patient tolerated the procedure well.         Disposition: PACU - hemodynamically stable.         Condition: stable  I was present & scrubbed for the surgery with the exception of skin  closure and I was immediately available afterwards.  I have reviewed, edited and agree with the operative note as documented by the resident.  Leighton Ruff. Redmond Pulling, MD, FACS General, Bariatric, & Minimally Invasive Surgery [facility], [place]
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
## 2020-08-11 NOTE — Anesthesia Procedure Notes (Signed)
Procedure Name: Intubation Date/Time: 08/11/2020 9:17 AM Performed by: Cynda Familia, CRNA Pre-anesthesia Checklist: Patient identified, Emergency Drugs available, Suction available and Patient being monitored Patient Re-evaluated:Patient Re-evaluated prior to induction Oxygen Delivery Method: Circle System Utilized Preoxygenation: Pre-oxygenation with 100% oxygen Induction Type: IV induction Ventilation: Mask ventilation without difficulty Laryngoscope Size: Miller and 2 Grade View: Grade I Tube type: Oral Tube size: 7.0 mm Number of attempts: 1 Airway Equipment and Method: Stylet Placement Confirmation: ETT inserted through vocal cords under direct vision,  positive ETCO2 and breath sounds checked- equal and bilateral Secured at: 22 cm Tube secured with: Tape Dental Injury: Teeth and Oropharynx as per pre-operative assessment  Comments: Smooth IV induction Rose-- intubation AM CRNA atraumatic-- teeth and mouth as preop- bilat BS Rose

## 2020-08-11 NOTE — Transfer of Care (Signed)
Immediate Anesthesia Transfer of Care Note  Patient: Donna Kidd  Procedure(s) Performed: LAPAROSCOPIC CHOLECYSTECTOMY (N/A Abdomen)  Patient Location: PACU  Anesthesia Type:General  Level of Consciousness: awake and alert   Airway & Oxygen Therapy: Patient Spontanous Breathing and Patient connected to face mask oxygen  Post-op Assessment: Report given to RN and Post -op Vital signs reviewed and stable  Post vital signs: Reviewed and stable  Last Vitals:  Vitals Value Taken Time  BP 173/72 08/11/20 1052  Temp    Pulse 82 08/11/20 1053  Resp 11 08/11/20 1053  SpO2 100 % 08/11/20 1053  Vitals shown include unvalidated device data.  Last Pain:  Vitals:   08/11/20 0825  TempSrc:   PainSc: 0-No pain         Complications: No complications documented.

## 2020-08-12 ENCOUNTER — Encounter (HOSPITAL_COMMUNITY): Payer: Self-pay | Admitting: General Surgery

## 2020-08-12 LAB — SURGICAL PATHOLOGY

## 2020-08-16 ENCOUNTER — Other Ambulatory Visit: Payer: Self-pay | Admitting: Endocrinology

## 2020-08-16 DIAGNOSIS — E1142 Type 2 diabetes mellitus with diabetic polyneuropathy: Secondary | ICD-10-CM

## 2020-08-16 DIAGNOSIS — Z794 Long term (current) use of insulin: Secondary | ICD-10-CM

## 2020-08-17 DIAGNOSIS — E119 Type 2 diabetes mellitus without complications: Secondary | ICD-10-CM | POA: Diagnosis not present

## 2020-08-23 ENCOUNTER — Encounter (INDEPENDENT_AMBULATORY_CARE_PROVIDER_SITE_OTHER): Payer: BC Managed Care – PPO | Admitting: Ophthalmology

## 2020-08-29 ENCOUNTER — Encounter (INDEPENDENT_AMBULATORY_CARE_PROVIDER_SITE_OTHER): Payer: BC Managed Care – PPO | Admitting: Ophthalmology

## 2020-08-29 ENCOUNTER — Encounter (INDEPENDENT_AMBULATORY_CARE_PROVIDER_SITE_OTHER): Payer: Self-pay

## 2020-09-16 DIAGNOSIS — E119 Type 2 diabetes mellitus without complications: Secondary | ICD-10-CM | POA: Diagnosis not present

## 2020-09-19 ENCOUNTER — Other Ambulatory Visit: Payer: Self-pay

## 2020-09-21 ENCOUNTER — Ambulatory Visit (INDEPENDENT_AMBULATORY_CARE_PROVIDER_SITE_OTHER): Payer: BC Managed Care – PPO | Admitting: Endocrinology

## 2020-09-21 ENCOUNTER — Other Ambulatory Visit: Payer: Self-pay | Admitting: Endocrinology

## 2020-09-21 ENCOUNTER — Other Ambulatory Visit: Payer: Self-pay

## 2020-09-21 VITALS — BP 146/96 | HR 76 | Ht 68.0 in | Wt 259.6 lb

## 2020-09-21 DIAGNOSIS — E1142 Type 2 diabetes mellitus with diabetic polyneuropathy: Secondary | ICD-10-CM | POA: Diagnosis not present

## 2020-09-21 DIAGNOSIS — E113511 Type 2 diabetes mellitus with proliferative diabetic retinopathy with macular edema, right eye: Secondary | ICD-10-CM | POA: Diagnosis not present

## 2020-09-21 DIAGNOSIS — Z794 Long term (current) use of insulin: Secondary | ICD-10-CM | POA: Diagnosis not present

## 2020-09-21 MED ORDER — DEXCOM G6 SENSOR MISC: 11 refills | Status: DC

## 2020-09-21 MED ORDER — OZEMPIC (1 MG/DOSE) 4 MG/3ML ~~LOC~~ SOPN: 2.0000 mg | PEN_INJECTOR | SUBCUTANEOUS | 11 refills | Status: DC

## 2020-09-21 MED ORDER — NOVOLOG FLEXPEN RELION 100 UNIT/ML ~~LOC~~ SOPN: 10.0000 [IU] | PEN_INJECTOR | Freq: Three times a day (TID) | SUBCUTANEOUS | 11 refills | Status: DC

## 2020-09-21 NOTE — Telephone Encounter (Signed)
Please advise 

## 2020-09-21 NOTE — Patient Instructions (Addendum)
Your blood pressure is high today.  Please see your primary care provider soon, to have it rechecked I have sent 2 prescriptions to your pharmacy: to change the V-GO to novolog 10 units 3 times a day (just before each meal), and to increase the Ozempic. Please continue the same [place].    check your blood sugar twice a day.  vary the time of day when you check, between before the 3 meals, and at bedtime.  also check if you have symptoms of your blood sugar being too high or too low.  please keep a record of the readings and bring it to your next appointment here (or you can bring the meter itself).  You can write it on any piece of paper.  please call us sooner if your blood sugar goes below 70, or if you have a lot of readings over 200.   Please come back for a follow-up appointment in 2 months.
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
## 2020-10-18 DIAGNOSIS — E11621 Type 2 diabetes mellitus with foot ulcer: Secondary | ICD-10-CM | POA: Diagnosis not present

## 2020-10-18 DIAGNOSIS — M79672 Pain in left foot: Secondary | ICD-10-CM | POA: Diagnosis not present

## 2020-10-18 DIAGNOSIS — L97402 Non-pressure chronic ulcer of unspecified heel and midfoot with fat layer exposed: Secondary | ICD-10-CM | POA: Diagnosis not present

## 2020-10-26 ENCOUNTER — Encounter: Payer: Self-pay | Admitting: Endocrinology

## 2020-10-27 ENCOUNTER — Other Ambulatory Visit: Payer: Self-pay | Admitting: Endocrinology

## 2020-10-27 DIAGNOSIS — E1142 Type 2 diabetes mellitus with diabetic polyneuropathy: Secondary | ICD-10-CM

## 2020-10-27 DIAGNOSIS — Z794 Long term (current) use of insulin: Secondary | ICD-10-CM

## 2020-10-27 MED ORDER — DEXCOM G6 SENSOR MISC: 9 refills | Status: DC

## 2020-10-27 MED ORDER — DEXCOM G6 RECEIVER DEVI
1.0000 | 0 refills | Status: DC
Start: 2020-10-27 — End: 2022-11-14

## 2020-10-27 MED ORDER — DEXCOM G6 TRANSMITTER MISC: 1.0000 | Freq: Once | 1 refills | Status: DC

## 2020-11-01 DIAGNOSIS — L97402 Non-pressure chronic ulcer of unspecified heel and midfoot with fat layer exposed: Secondary | ICD-10-CM | POA: Diagnosis not present

## 2020-11-01 DIAGNOSIS — M549 Dorsalgia, unspecified: Secondary | ICD-10-CM | POA: Diagnosis not present

## 2020-11-01 DIAGNOSIS — Z79899 Other long term (current) drug therapy: Secondary | ICD-10-CM | POA: Diagnosis not present

## 2020-11-01 DIAGNOSIS — Z5181 Encounter for therapeutic drug level monitoring: Secondary | ICD-10-CM | POA: Diagnosis not present

## 2020-11-01 DIAGNOSIS — G894 Chronic pain syndrome: Secondary | ICD-10-CM | POA: Diagnosis not present

## 2020-11-01 DIAGNOSIS — E11621 Type 2 diabetes mellitus with foot ulcer: Secondary | ICD-10-CM | POA: Diagnosis not present

## 2020-11-01 DIAGNOSIS — M25511 Pain in right shoulder: Secondary | ICD-10-CM | POA: Diagnosis not present

## 2020-11-01 DIAGNOSIS — E11610 Type 2 diabetes mellitus with diabetic neuropathic arthropathy: Secondary | ICD-10-CM | POA: Diagnosis not present

## 2020-11-09 DIAGNOSIS — E11621 Type 2 diabetes mellitus with foot ulcer: Secondary | ICD-10-CM | POA: Diagnosis not present

## 2020-11-09 DIAGNOSIS — L97402 Non-pressure chronic ulcer of unspecified heel and midfoot with fat layer exposed: Secondary | ICD-10-CM | POA: Diagnosis not present

## 2020-11-15 DIAGNOSIS — L97409 Non-pressure chronic ulcer of unspecified heel and midfoot with unspecified severity: Secondary | ICD-10-CM | POA: Diagnosis not present

## 2020-11-15 DIAGNOSIS — E11621 Type 2 diabetes mellitus with foot ulcer: Secondary | ICD-10-CM | POA: Diagnosis not present

## 2020-12-21 ENCOUNTER — Ambulatory Visit: Payer: Self-pay | Admitting: Endocrinology

## 2020-12-27 DIAGNOSIS — L97409 Non-pressure chronic ulcer of unspecified heel and midfoot with unspecified severity: Secondary | ICD-10-CM | POA: Diagnosis not present

## 2020-12-27 DIAGNOSIS — E11621 Type 2 diabetes mellitus with foot ulcer: Secondary | ICD-10-CM | POA: Diagnosis not present

## 2020-12-27 DIAGNOSIS — E1143 Type 2 diabetes mellitus with diabetic autonomic (poly)neuropathy: Secondary | ICD-10-CM | POA: Diagnosis not present

## 2020-12-27 DIAGNOSIS — E11610 Type 2 diabetes mellitus with diabetic neuropathic arthropathy: Secondary | ICD-10-CM | POA: Diagnosis not present

## 2021-01-02 DIAGNOSIS — E11621 Type 2 diabetes mellitus with foot ulcer: Secondary | ICD-10-CM | POA: Diagnosis not present

## 2021-01-02 DIAGNOSIS — L97409 Non-pressure chronic ulcer of unspecified heel and midfoot with unspecified severity: Secondary | ICD-10-CM | POA: Diagnosis not present

## 2021-01-10 DIAGNOSIS — L97409 Non-pressure chronic ulcer of unspecified heel and midfoot with unspecified severity: Secondary | ICD-10-CM | POA: Diagnosis not present

## 2021-01-10 DIAGNOSIS — E11621 Type 2 diabetes mellitus with foot ulcer: Secondary | ICD-10-CM | POA: Diagnosis not present

## 2021-01-10 DIAGNOSIS — E11610 Type 2 diabetes mellitus with diabetic neuropathic arthropathy: Secondary | ICD-10-CM | POA: Diagnosis not present

## 2021-01-10 DIAGNOSIS — E1143 Type 2 diabetes mellitus with diabetic autonomic (poly)neuropathy: Secondary | ICD-10-CM | POA: Diagnosis not present

## 2021-01-18 DIAGNOSIS — L97409 Non-pressure chronic ulcer of unspecified heel and midfoot with unspecified severity: Secondary | ICD-10-CM | POA: Diagnosis not present

## 2021-01-18 DIAGNOSIS — E11621 Type 2 diabetes mellitus with foot ulcer: Secondary | ICD-10-CM | POA: Diagnosis not present

## 2021-01-31 ENCOUNTER — Encounter: Payer: Self-pay | Admitting: Endocrinology

## 2021-01-31 DIAGNOSIS — L97409 Non-pressure chronic ulcer of unspecified heel and midfoot with unspecified severity: Secondary | ICD-10-CM | POA: Diagnosis not present

## 2021-01-31 DIAGNOSIS — E11610 Type 2 diabetes mellitus with diabetic neuropathic arthropathy: Secondary | ICD-10-CM | POA: Diagnosis not present

## 2021-01-31 DIAGNOSIS — E11621 Type 2 diabetes mellitus with foot ulcer: Secondary | ICD-10-CM | POA: Diagnosis not present

## 2021-01-31 MED ORDER — OZEMPIC (2 MG/DOSE) 8 MG/3ML ~~LOC~~ SOPN
2.0000 mg | PEN_INJECTOR | SUBCUTANEOUS | 5 refills | Status: DC
Start: 2021-01-31 — End: 2021-06-13

## 2021-02-03 DIAGNOSIS — E113593 Type 2 diabetes mellitus with proliferative diabetic retinopathy without macular edema, bilateral: Secondary | ICD-10-CM | POA: Diagnosis not present

## 2021-02-03 DIAGNOSIS — H5203 Hypermetropia, bilateral: Secondary | ICD-10-CM | POA: Diagnosis not present

## 2021-02-03 LAB — HM DIABETES EYE EXAM

## 2021-02-08 DIAGNOSIS — L97429 Non-pressure chronic ulcer of left heel and midfoot with unspecified severity: Secondary | ICD-10-CM | POA: Diagnosis not present

## 2021-02-08 DIAGNOSIS — E11610 Type 2 diabetes mellitus with diabetic neuropathic arthropathy: Secondary | ICD-10-CM | POA: Diagnosis not present

## 2021-02-08 DIAGNOSIS — G894 Chronic pain syndrome: Secondary | ICD-10-CM | POA: Diagnosis not present

## 2021-02-08 DIAGNOSIS — E1140 Type 2 diabetes mellitus with diabetic neuropathy, unspecified: Secondary | ICD-10-CM | POA: Diagnosis not present

## 2021-02-08 DIAGNOSIS — M549 Dorsalgia, unspecified: Secondary | ICD-10-CM | POA: Diagnosis not present

## 2021-02-08 DIAGNOSIS — M25511 Pain in right shoulder: Secondary | ICD-10-CM | POA: Diagnosis not present

## 2021-02-08 DIAGNOSIS — E11621 Type 2 diabetes mellitus with foot ulcer: Secondary | ICD-10-CM | POA: Diagnosis not present

## 2021-02-15 DIAGNOSIS — L97429 Non-pressure chronic ulcer of left heel and midfoot with unspecified severity: Secondary | ICD-10-CM | POA: Diagnosis not present

## 2021-02-15 DIAGNOSIS — E11621 Type 2 diabetes mellitus with foot ulcer: Secondary | ICD-10-CM | POA: Diagnosis not present

## 2021-02-18 IMAGING — DX DG KNEE COMPLETE 4+V*R*
4 series · 4 of 4 positions shown · non-contrast
Comparison: None.

CLINICAL DATA: Knee pain and swelling

EXAM:
RIGHT KNEE - COMPLETE 4+ VIEW

[knee ap]
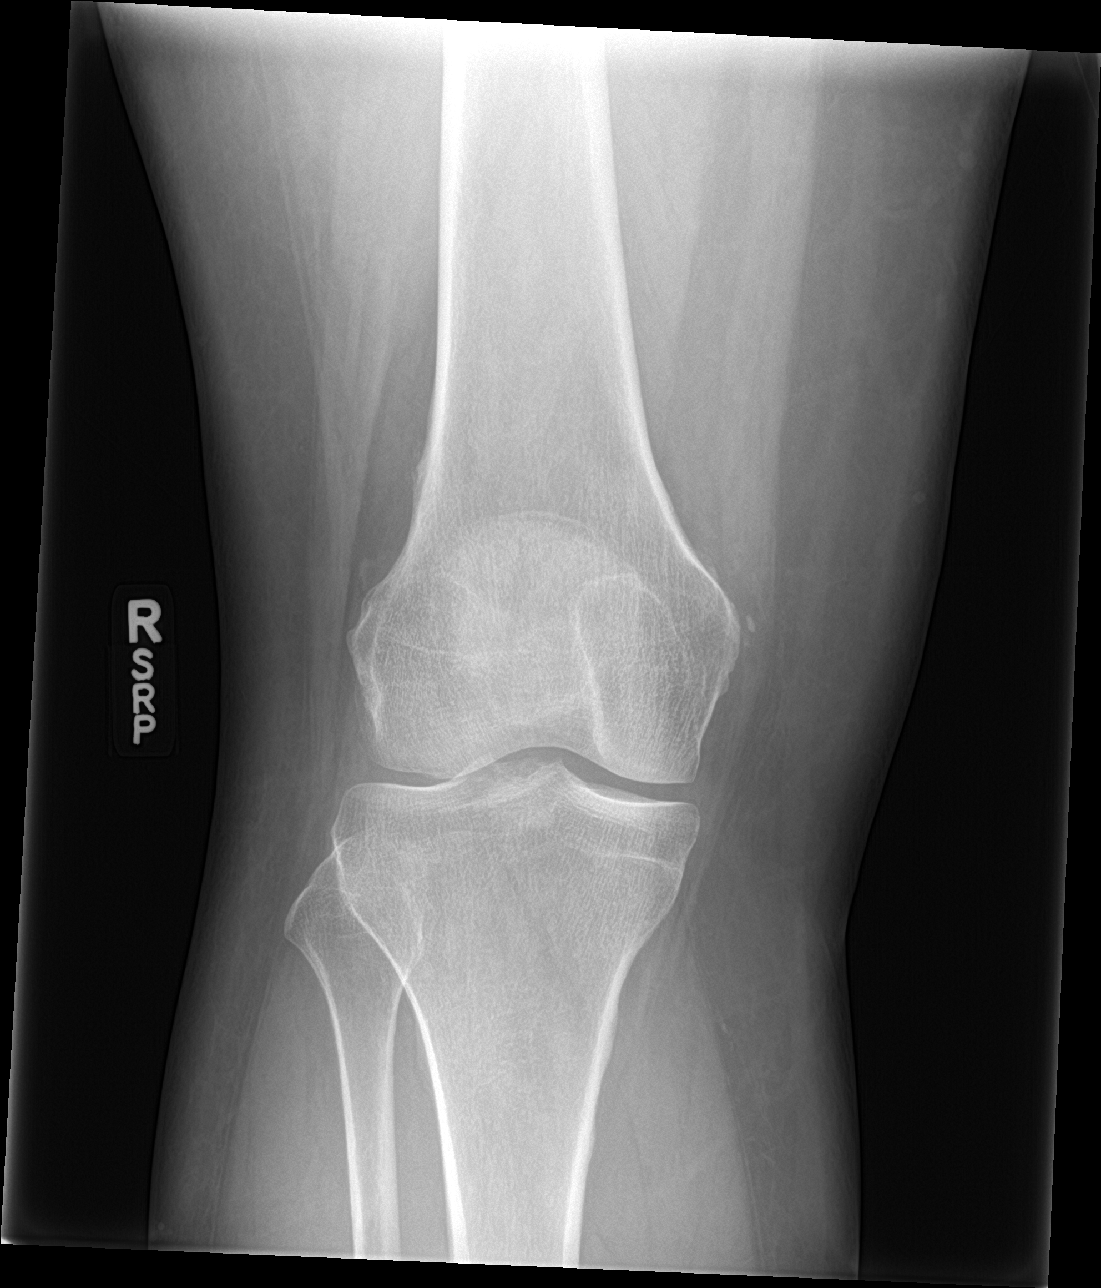

[knee obl (1 of 2)]
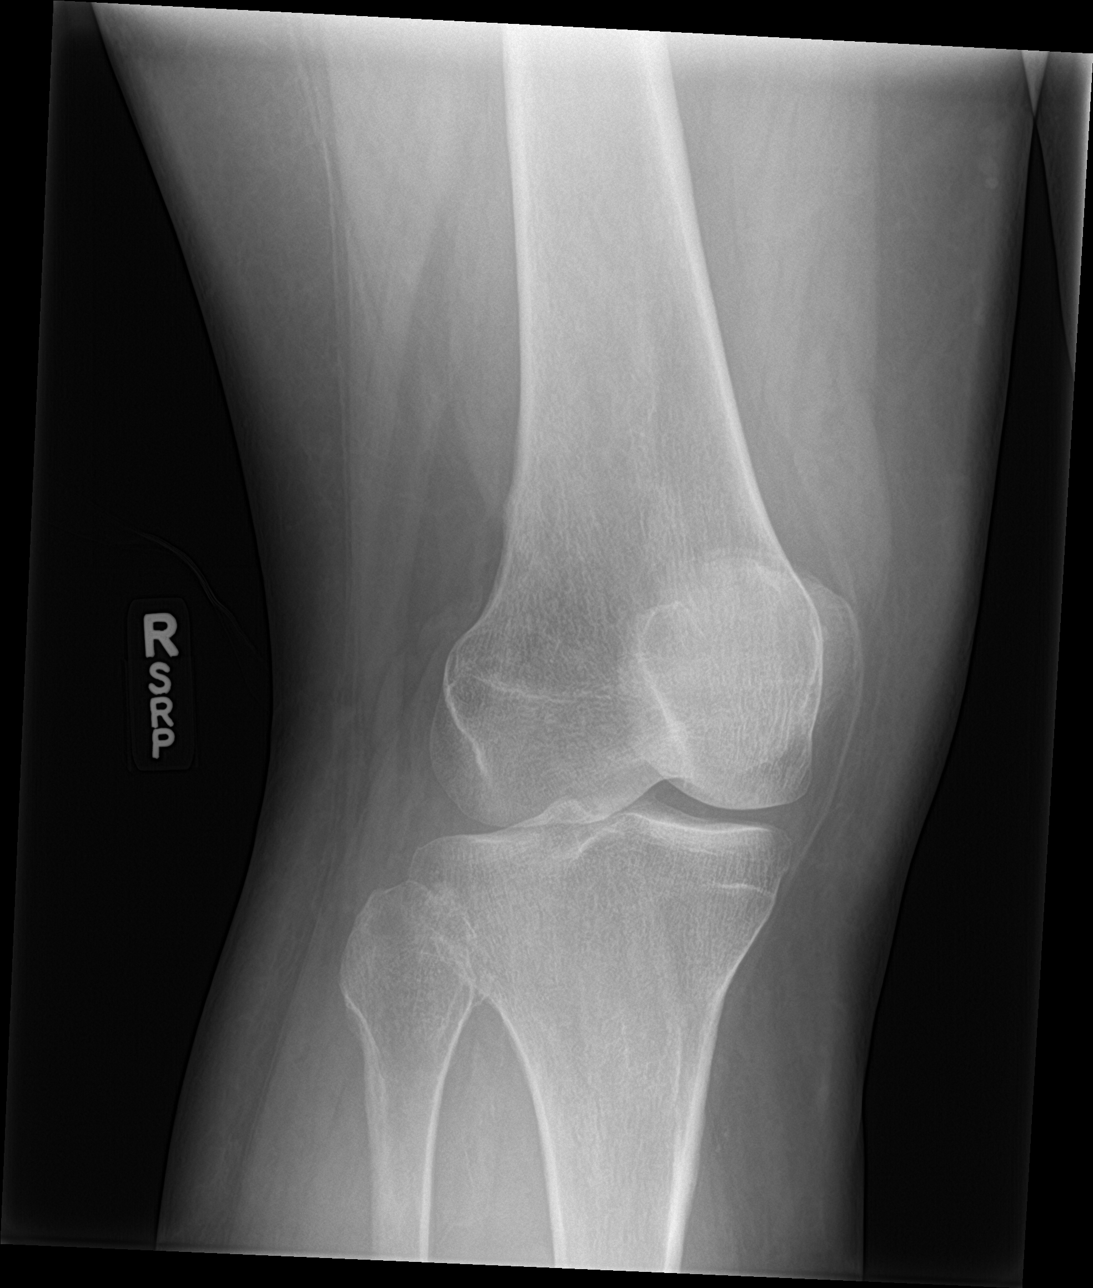

[knee obl (2 of 2)]
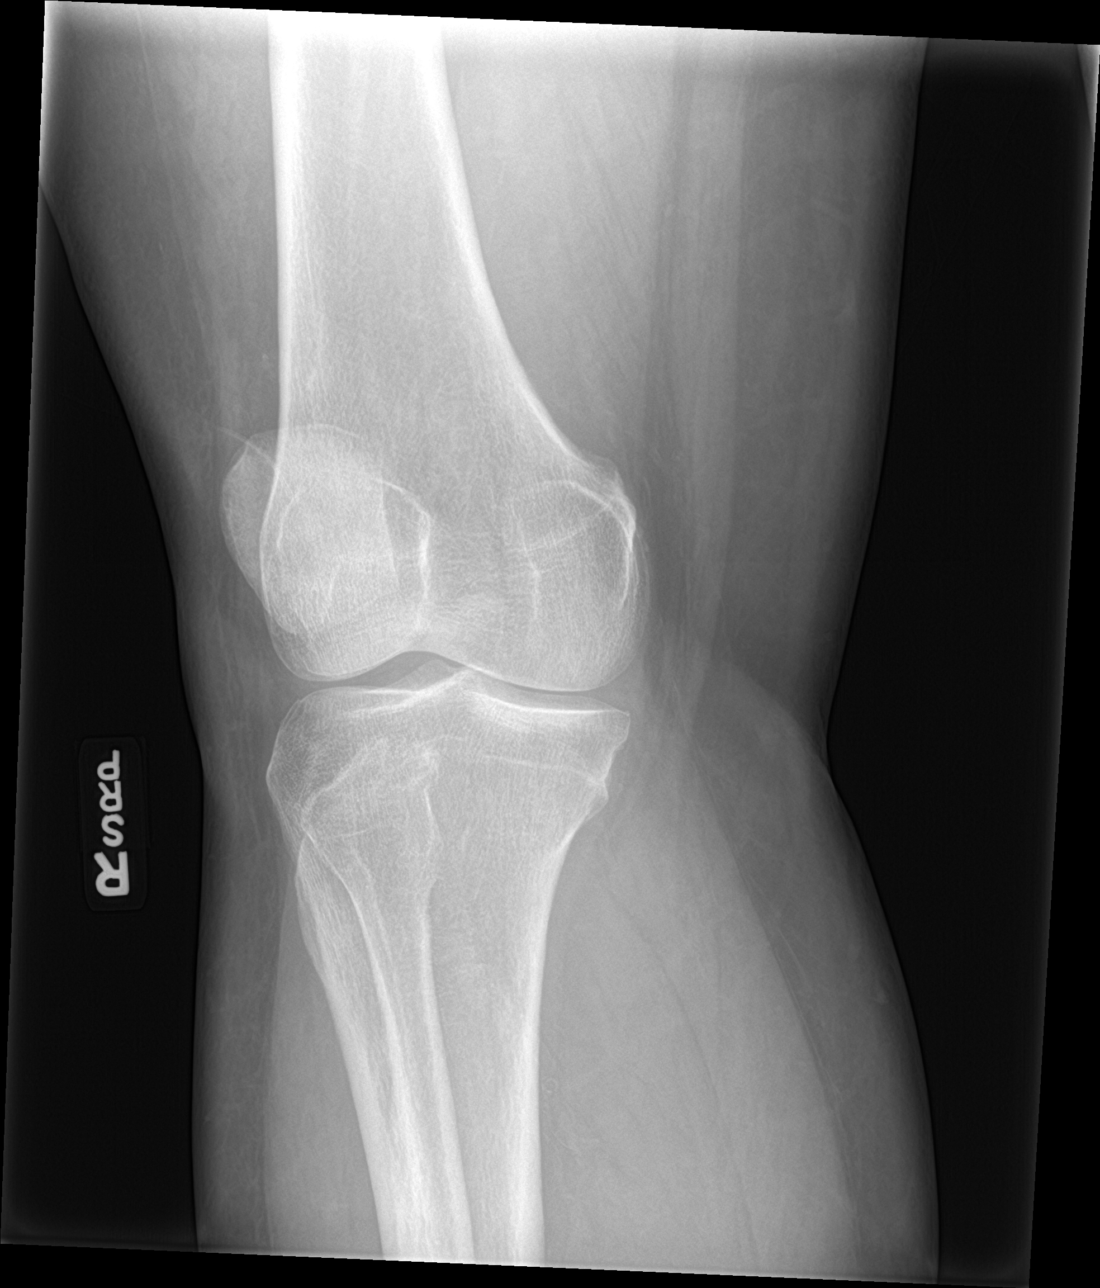

[knee lat]
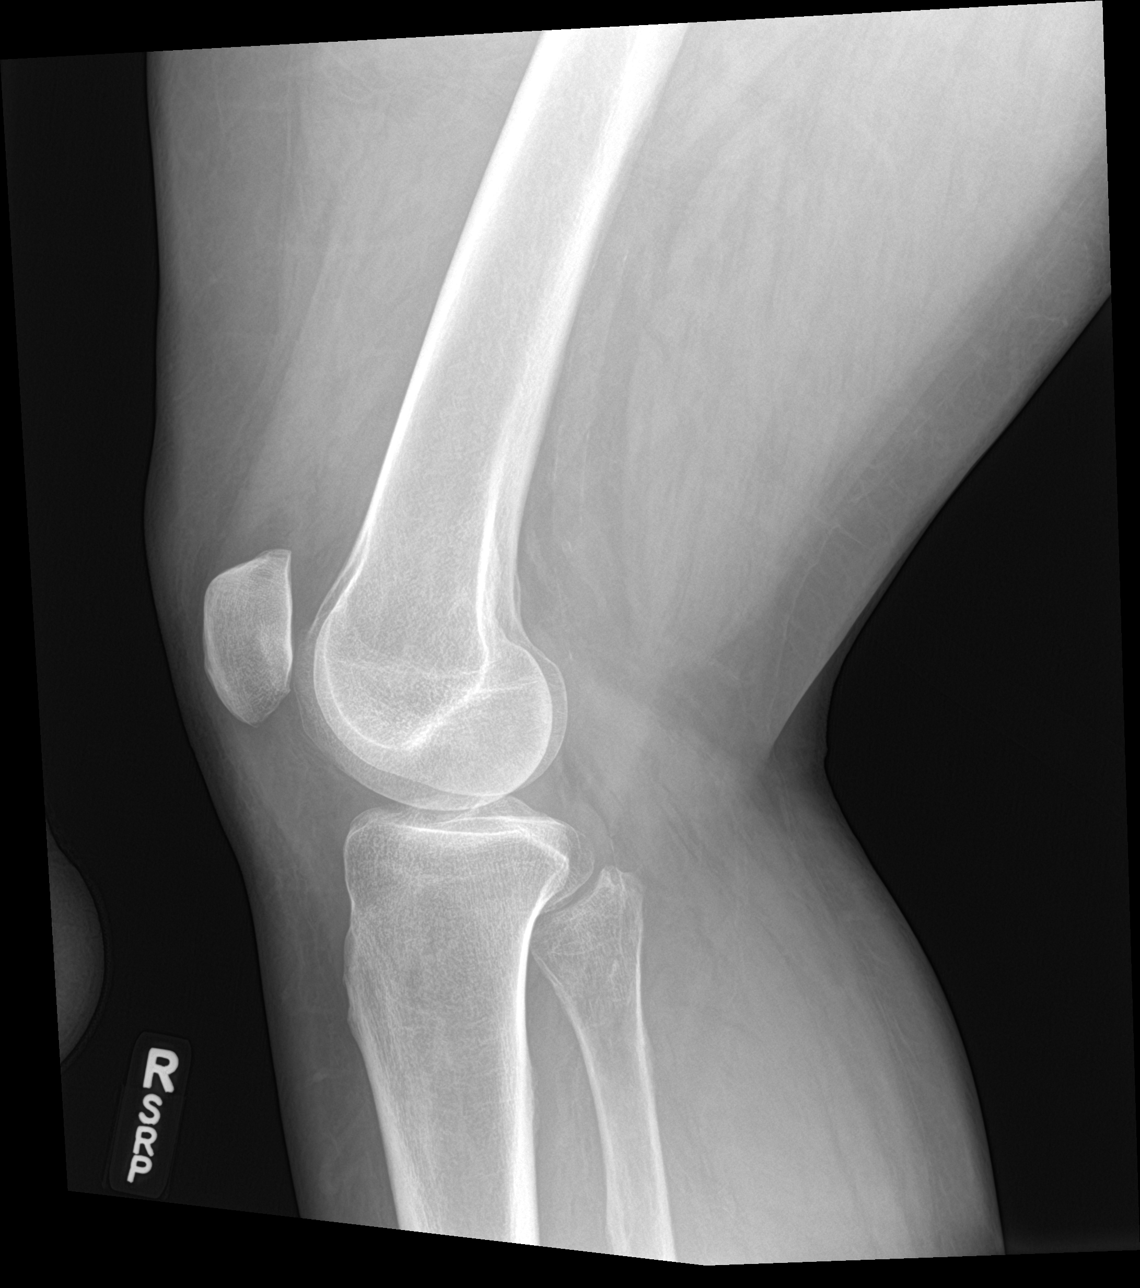

[4 of 4 positions shown; findings below may reference images not displayed]

FINDINGS: No acute fracture or dislocation. No aggressive osseous lesion.
Small calcification adjacent to the periphery of the medial femoral
condyle at the MCL origin likely reflecting sequela prior injury. No
joint effusion. Soft tissues are normal.
IMPRESSION: No acute osseous injury of the right knee.

## 2021-02-22 DIAGNOSIS — L97429 Non-pressure chronic ulcer of left heel and midfoot with unspecified severity: Secondary | ICD-10-CM | POA: Diagnosis not present

## 2021-02-22 DIAGNOSIS — E11621 Type 2 diabetes mellitus with foot ulcer: Secondary | ICD-10-CM | POA: Diagnosis not present

## 2021-03-01 DIAGNOSIS — E11610 Type 2 diabetes mellitus with diabetic neuropathic arthropathy: Secondary | ICD-10-CM | POA: Diagnosis not present

## 2021-03-01 DIAGNOSIS — E11621 Type 2 diabetes mellitus with foot ulcer: Secondary | ICD-10-CM | POA: Diagnosis not present

## 2021-03-01 DIAGNOSIS — E1143 Type 2 diabetes mellitus with diabetic autonomic (poly)neuropathy: Secondary | ICD-10-CM | POA: Diagnosis not present

## 2021-03-01 DIAGNOSIS — L97429 Non-pressure chronic ulcer of left heel and midfoot with unspecified severity: Secondary | ICD-10-CM | POA: Diagnosis not present

## 2021-03-02 ENCOUNTER — Emergency Department (HOSPITAL_COMMUNITY)
Admission: EM | Admit: 2021-03-02 | Discharge: 2021-03-02 | Disposition: A | Discharge status: Home / Self Care | Payer: BC Managed Care – PPO | Attending: Emergency Medicine | Admitting: Emergency Medicine

## 2021-03-02 ENCOUNTER — Emergency Department (HOSPITAL_COMMUNITY): Payer: BC Managed Care – PPO

## 2021-03-02 ENCOUNTER — Encounter: Payer: Self-pay | Admitting: Emergency Medicine

## 2021-03-02 ENCOUNTER — Ambulatory Visit
Admission: EM | Admit: 2021-03-02 | Discharge: 2021-03-02 | Disposition: A | Discharge status: Home / Self Care | Payer: BC Managed Care – PPO

## 2021-03-02 ENCOUNTER — Encounter (HOSPITAL_COMMUNITY): Payer: Self-pay | Admitting: *Deleted

## 2021-03-02 ENCOUNTER — Other Ambulatory Visit: Payer: Self-pay

## 2021-03-02 DIAGNOSIS — R197 Diarrhea, unspecified: Secondary | ICD-10-CM | POA: Insufficient documentation

## 2021-03-02 DIAGNOSIS — E11319 Type 2 diabetes mellitus with unspecified diabetic retinopathy without macular edema: Secondary | ICD-10-CM | POA: Insufficient documentation

## 2021-03-02 DIAGNOSIS — I10 Essential (primary) hypertension: Secondary | ICD-10-CM | POA: Insufficient documentation

## 2021-03-02 DIAGNOSIS — R110 Nausea: Secondary | ICD-10-CM | POA: Diagnosis not present

## 2021-03-02 DIAGNOSIS — R109 Unspecified abdominal pain: Secondary | ICD-10-CM | POA: Diagnosis not present

## 2021-03-02 DIAGNOSIS — R630 Anorexia: Secondary | ICD-10-CM | POA: Insufficient documentation

## 2021-03-02 DIAGNOSIS — R1084 Generalized abdominal pain: Secondary | ICD-10-CM | POA: Insufficient documentation

## 2021-03-02 DIAGNOSIS — Z794 Long term (current) use of insulin: Secondary | ICD-10-CM | POA: Diagnosis not present

## 2021-03-02 DIAGNOSIS — R1110 Vomiting, unspecified: Secondary | ICD-10-CM | POA: Diagnosis not present

## 2021-03-02 DIAGNOSIS — E1140 Type 2 diabetes mellitus with diabetic neuropathy, unspecified: Secondary | ICD-10-CM | POA: Diagnosis not present

## 2021-03-02 DIAGNOSIS — R1013 Epigastric pain: Secondary | ICD-10-CM

## 2021-03-02 LAB — COMPREHENSIVE METABOLIC PANEL
ALT: 23 U/L (ref 0–44)
AST: 20 U/L (ref 15–41)
Albumin: 4.5 g/dL (ref 3.5–5.0)
Alkaline Phosphatase: 144 U/L — ABNORMAL HIGH (ref 38–126)
Anion gap: 14 (ref 5–15)
BUN: 11 mg/dL (ref 6–20)
CO2: 25 mmol/L (ref 22–32)
Calcium: 9.4 mg/dL (ref 8.9–10.3)
Chloride: 100 mmol/L (ref 98–111)
Creatinine, Ser: 0.63 mg/dL (ref 0.44–1.00)
GFR, Estimated: >60 mL/min (ref >60)
Glucose, Bld: 164 mg/dL — ABNORMAL HIGH (ref 70–99)
Potassium: 4.1 mmol/L (ref 3.5–5.1)
Sodium: 139 mmol/L (ref 135–145)
Total Bilirubin: 0.7 mg/dL (ref 0.3–1.2)
Total Protein: 8 g/dL (ref 6.5–8.1)

## 2021-03-02 LAB — LIPASE, BLOOD: Lipase: 34 U/L (ref 11–51)

## 2021-03-02 LAB — CBC
HCT: 45 % (ref 36.0–46.0)
Hemoglobin: 14.3 g/dL (ref 12.0–15.0)
MCH: 28.1 pg (ref 26.0–34.0)
MCHC: 31.8 g/dL (ref 30.0–36.0)
MCV: 88.6 fL (ref 80.0–100.0)
Platelets: 363 10*3/uL (ref 150–400)
RBC: 5.08 MIL/uL (ref 3.87–5.11)
RDW: 14.9 % (ref 11.5–15.5)
WBC: 8.8 10*3/uL (ref 4.0–10.5)
nRBC: 0 % (ref 0.0–0.2)

## 2021-03-02 LAB — URINALYSIS, ROUTINE W REFLEX MICROSCOPIC
Bilirubin Urine: NEGATIVE
Glucose, UA: >=500 mg/dL — AB
Hgb urine dipstick: NEGATIVE
Ketones, ur: 20 mg/dL — AB
Nitrite: NEGATIVE
Protein, ur: NEGATIVE mg/dL
Specific Gravity, Urine: 1.018 (ref 1.005–1.030)
pH: 6 (ref 5.0–8.0)

## 2021-03-02 MED ORDER — LIDOCAINE VISCOUS HCL 2 % MT SOLN
15.0000 mL | Freq: Once | OROMUCOSAL | Status: AC
  Administered 2021-03-02: 15 mL via ORAL
  Filled 2021-03-02: qty 15

## 2021-03-02 MED ORDER — ONDANSETRON HCL 4 MG PO TABS: 4.0000 mg | ORAL_TABLET | Freq: Four times a day (QID) | ORAL | 0 refills | Status: DC

## 2021-03-02 MED ORDER — ONDANSETRON HCL 4 MG/2ML IJ SOLN
4.0000 mg | Freq: Once | INTRAMUSCULAR | Status: AC
  Administered 2021-03-02: 4 mg via INTRAVENOUS
  Filled 2021-03-02: qty 2

## 2021-03-02 MED ORDER — DICYCLOMINE HCL 10 MG/ML IM SOLN
20.0000 mg | Freq: Once | INTRAMUSCULAR | Status: AC
  Administered 2021-03-02: 20 mg via INTRAMUSCULAR
  Filled 2021-03-02: qty 2

## 2021-03-02 MED ORDER — SODIUM CHLORIDE 0.9 % IV BOLUS
1000.0000 mL | Freq: Once | INTRAVENOUS | Status: AC
  Administered 2021-03-02: 1000 mL via INTRAVENOUS

## 2021-03-02 MED ORDER — SUCRALFATE 1 GM/10ML PO SUSP
1.0000 g | Freq: Once | ORAL | Status: AC
  Administered 2021-03-02: 1 g via ORAL
  Filled 2021-03-02: qty 10

## 2021-03-02 MED ORDER — OMEPRAZOLE 20 MG PO CPDR: 20.0000 mg | DELAYED_RELEASE_CAPSULE | Freq: Every day | ORAL | 0 refills | Status: DC

## 2021-03-02 MED ORDER — IOHEXOL 300 MG/ML  SOLN
100.0000 mL | Freq: Once | INTRAMUSCULAR | Status: AC | PRN
  Administered 2021-03-02: 100 mL via INTRAVENOUS

## 2021-03-02 MED ORDER — ALUM & MAG HYDROXIDE-SIMETH 200-200-20 MG/5ML PO SUSP
30.0000 mL | Freq: Once | ORAL | Status: AC
  Administered 2021-03-02: 30 mL via ORAL
  Filled 2021-03-02: qty 30

## 2021-03-02 NOTE — Discharge Instructions (Addendum)
Lab work imaging look reassuring.  starting you on an acid pill please take as prescribed, also given you Zofran for nausea please use as needed.  I recommend reading about acid reflux and avoiding these foods.  Please follow-up with GI for further evaluation, given contact of range above please call  Come back to the emergency department if you develop chest pain, shortness of breath, severe abdominal pain, uncontrolled nausea, vomiting, diarrhea.

## 2021-03-02 NOTE — ED Triage Notes (Signed)
Upper  abd pain x 2 months.  Pt had gallbladder removed in dec of last year.
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
## 2021-03-02 NOTE — ED Triage Notes (Signed)
Abdominal pain with vomiting for the past 3 weeks, currently does not have a PCP and was referred her by urgent care

## 2021-03-02 NOTE — ED Notes (Signed)
Patient is being discharged from the Urgent Care and sent to the Emergency Department via pov . Per Roderic Ovens, patient is in need of higher level of care due to abd pain. Patient is aware and verbalizes understanding of plan of care.  Vitals:   03/02/21 1230  BP: (!) 144/85  Pulse: 78  Resp: 16  Temp: (!) 97.5 F (36.4 C)  SpO2: 96%

## 2021-03-02 NOTE — Discharge Instructions (Addendum)
Recommending further evaluation and management in the ED for epigastric pain, diarrhea, and fatigue x 2-3 weeks.  Patient aware and in agreement.  Will travel by private vehicle to [place].

## 2021-03-04 LAB — URINE CULTURE

## 2021-03-07 DIAGNOSIS — L97429 Non-pressure chronic ulcer of left heel and midfoot with unspecified severity: Secondary | ICD-10-CM | POA: Diagnosis not present

## 2021-03-07 DIAGNOSIS — E11621 Type 2 diabetes mellitus with foot ulcer: Secondary | ICD-10-CM | POA: Diagnosis not present

## 2021-03-09 ENCOUNTER — Encounter: Payer: Self-pay | Admitting: Gastroenterology

## 2021-03-09 ENCOUNTER — Ambulatory Visit (INDEPENDENT_AMBULATORY_CARE_PROVIDER_SITE_OTHER): Payer: BC Managed Care – PPO | Admitting: Gastroenterology

## 2021-03-09 VITALS — BP 128/80 | HR 100 | Ht 68.0 in | Wt 249.0 lb

## 2021-03-09 DIAGNOSIS — R198 Other specified symptoms and signs involving the digestive system and abdomen: Secondary | ICD-10-CM

## 2021-03-09 DIAGNOSIS — R1013 Epigastric pain: Secondary | ICD-10-CM | POA: Diagnosis not present

## 2021-03-09 DIAGNOSIS — R110 Nausea: Secondary | ICD-10-CM

## 2021-03-09 DIAGNOSIS — R6881 Early satiety: Secondary | ICD-10-CM | POA: Insufficient documentation

## 2021-03-09 MED ORDER — METOCLOPRAMIDE HCL 5 MG PO TABS: 5.0000 mg | ORAL_TABLET | Freq: Three times a day (TID) | ORAL | 3 refills | Status: DC

## 2021-03-09 NOTE — Patient Instructions (Addendum)
If you are age 55 or younger, your body mass index should be between 19-25. Your Body mass index is 37.86 kg/m. If this is out of the aformentioned range listed, please consider follow up with your Primary Care Provider.  __________________________________________________________  The [facility] providers would like to encourage you to use [facility] to communicate with providers for non-urgent requests or questions.  Due to long hold times on the telephone, sending your provider a message by [facility] may be a faster and more efficient way to get a response.  Please allow 48 business hours for a response.  Please remember that this is for non-urgent requests.   START Metoclopramide (Reglan) 5 mg 1 tablet three times daily before meals and 1 tablet at bedtime.   Janett Billow will be talking to Dr. Loletha Carrow and/or Radiology about imaging studies that we may be able to do.  Follow up pending at this time.  Thank you for entrusting me with your care and choosing [facility].  Alonza Bogus, PA-C
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
## 2021-03-14 DIAGNOSIS — L97429 Non-pressure chronic ulcer of left heel and midfoot with unspecified severity: Secondary | ICD-10-CM | POA: Diagnosis not present

## 2021-03-14 DIAGNOSIS — E11621 Type 2 diabetes mellitus with foot ulcer: Secondary | ICD-10-CM | POA: Diagnosis not present

## 2021-03-15 ENCOUNTER — Telehealth: Payer: Self-pay

## 2021-03-15 ENCOUNTER — Other Ambulatory Visit: Payer: Self-pay

## 2021-03-15 DIAGNOSIS — R110 Nausea: Secondary | ICD-10-CM

## 2021-03-15 DIAGNOSIS — R198 Other specified symptoms and signs involving the digestive system and abdomen: Secondary | ICD-10-CM

## 2021-03-15 DIAGNOSIS — R1013 Epigastric pain: Secondary | ICD-10-CM

## 2021-03-15 DIAGNOSIS — R6881 Early satiety: Secondary | ICD-10-CM

## 2021-03-15 MED ORDER — METRONIDAZOLE 500 MG PO TABS: 500.0000 mg | ORAL_TABLET | Freq: Three times a day (TID) | ORAL | 0 refills | Status: AC

## 2021-03-15 NOTE — Telephone Encounter (Signed)
-----   Message from Loralie Champagne, PA-C sent at 03/15/2021  1:13 PM EDT ----- Per Dr. Loletha Carrow:  No gastric emptying scan because of her gastric bypass anatomy.    Rather than standing dose of metoclopramide as prescribed, he recommends she only use it as needed if the ondansetron does not control nausea or vomiting.  H would like her to have a full small bowel x-ray series to evaluate jejunojejunal anastomosis and be certain there is no stricture, torsion, or other mechanical cause of partial obstruction there.  And he would like to treat her for possible SIBO with metronidazole 500 mg 3 times daily x14 days.  Then she needs a follow-up appt made with him as well.  Thank you,  Jess

## 2021-03-15 NOTE — Telephone Encounter (Signed)
Prescription sent to the pharmacy for metronidazole  Donna Kidd is the small bowel x ray a small bowel follow thru?

## 2021-03-15 NOTE — Progress Notes (Addendum)
____________________________________________________________  Attending physician addendum:  Thank you for sending this case to me. I have reviewed the entire note.  I am very familiar with Ayasha, and her symptoms have been an ongoing possible since last year.  Testing at that time showed no abnormalities of the gastric remnant, no obstruction at the GJ anastomosis and CT scan with no bowel dilatation to suggest JJ anastomotic stricture.  After that, it seemed most likely to be biliary colic, which is why recommended cholecystectomy.  Unfortunately symptoms have returned with associated diarrhea.  I do not think it is her diabetic meds, as it is a stable regimen for her, nor do I think her diabetes itself is contributing since she has had generally good control.  And referring her to surgery last year, I also wondered whether adhesions may been causing some mechanical partial obstructive cause, but I assume none were found during the laparoscopic cholecystectomy.  Gastric bypass certainly is a risk factor for SIBO, though degree of pain she is describing with vomiting seems unusual for that diagnosis. 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  As for your potential plans for gastric emptying study, gastric bypass anatomy precludes that.  My advice is the following:  Rather than standing dose of metoclopramide as prescribed, I recommend she only use it as needed if the ondansetron does not control nausea or vomiting.  Full small bowel x-ray series to evaluate jejunojejunal anastomosis and be certain there is no stricture, torsion or other mechanical cause of partial obstruction there.  Empiric therapy for SIBO.  Rifaximin might be less effective because it is not systemically absorbed and therefore would not get to the bypassed limb.  Therefore, I recommend metronidazole 500 mg 3 times  daily x14 days.  No gastric emptying study.  Wilfrid Lund, MD  Addendum:    If no improvement with flagyl, then a trial of hyoscyamine as needed.  - H. Loletha Carrow, MD  ____________________________________________________________

## 2021-03-15 NOTE — Telephone Encounter (Signed)
The order has been entered for SBFT and sent to the schedulers to set up. I have sent the pt a message with all the information.  Prescription sent to the pharmacy.  The pt will call to set up follow up appt. She has also been advised to take metoclopramide as needed.

## 2021-03-20 DIAGNOSIS — E11610 Type 2 diabetes mellitus with diabetic neuropathic arthropathy: Secondary | ICD-10-CM | POA: Diagnosis not present

## 2021-03-20 DIAGNOSIS — E11621 Type 2 diabetes mellitus with foot ulcer: Secondary | ICD-10-CM | POA: Diagnosis not present

## 2021-03-20 DIAGNOSIS — E1143 Type 2 diabetes mellitus with diabetic autonomic (poly)neuropathy: Secondary | ICD-10-CM | POA: Diagnosis not present

## 2021-03-20 DIAGNOSIS — L97429 Non-pressure chronic ulcer of left heel and midfoot with unspecified severity: Secondary | ICD-10-CM | POA: Diagnosis not present

## 2021-03-28 DIAGNOSIS — E11621 Type 2 diabetes mellitus with foot ulcer: Secondary | ICD-10-CM | POA: Diagnosis not present

## 2021-03-28 DIAGNOSIS — E11610 Type 2 diabetes mellitus with diabetic neuropathic arthropathy: Secondary | ICD-10-CM | POA: Diagnosis not present

## 2021-03-28 DIAGNOSIS — L97429 Non-pressure chronic ulcer of left heel and midfoot with unspecified severity: Secondary | ICD-10-CM | POA: Diagnosis not present

## 2021-04-04 NOTE — Telephone Encounter (Signed)
See 03/15/21 phone portal message  This patient does not appear to have had her UGI/SBFT Xray series.  Please inquire if she has it scheduled and if she is still having diarrhea.  She also came up on recall for surveillance colonoscopy, but she is probably not ready for that because current symptoms would likely make bowel prep difficult.  - HD

## 2021-04-05 NOTE — Telephone Encounter (Signed)
Dr. Loletha Carrow, please see note from patient in regards to symptoms. She also had a CT scan done in 7/22 while at [facility], she would like for you to review that. Thanks

## 2021-04-11 ENCOUNTER — Telehealth: Payer: Self-pay | Admitting: Pharmacy Technician

## 2021-04-11 NOTE — Telephone Encounter (Addendum)
Patient Advocate Encounter   Received notification from [place] that prior authorization for DEXCOM G6 TRANSMITTER is required.   PA submitted on 04/11/2021 Key [id number] Status is APPROVED Effective from 04/11/2021 through 04/10/2022.    [facility] will continue to follow   Ronney Asters, CPhT Patient Advocate [facility] Phone: [phone] Fax:  [phone]

## 2021-04-12 DIAGNOSIS — L97429 Non-pressure chronic ulcer of left heel and midfoot with unspecified severity: Secondary | ICD-10-CM | POA: Diagnosis not present

## 2021-04-12 DIAGNOSIS — E11621 Type 2 diabetes mellitus with foot ulcer: Secondary | ICD-10-CM | POA: Diagnosis not present

## 2021-04-12 DIAGNOSIS — E11610 Type 2 diabetes mellitus with diabetic neuropathic arthropathy: Secondary | ICD-10-CM | POA: Diagnosis not present

## 2021-04-13 ENCOUNTER — Other Ambulatory Visit: Payer: Self-pay

## 2021-04-13 ENCOUNTER — Ambulatory Visit (HOSPITAL_COMMUNITY)
Admission: RE | Admit: 2021-04-13 | Discharge: 2021-04-13 | Disposition: A | Discharge status: Home / Self Care | Payer: BC Managed Care – PPO | Source: Ambulatory Visit | Attending: Gastroenterology | Admitting: Gastroenterology

## 2021-04-13 DIAGNOSIS — R6881 Early satiety: Secondary | ICD-10-CM

## 2021-04-13 DIAGNOSIS — R110 Nausea: Secondary | ICD-10-CM | POA: Insufficient documentation

## 2021-04-13 DIAGNOSIS — R1013 Epigastric pain: Secondary | ICD-10-CM

## 2021-04-13 DIAGNOSIS — R198 Other specified symptoms and signs involving the digestive system and abdomen: Secondary | ICD-10-CM | POA: Diagnosis not present

## 2021-04-25 ENCOUNTER — Other Ambulatory Visit: Payer: Self-pay | Admitting: Endocrinology

## 2021-04-25 DIAGNOSIS — Z794 Long term (current) use of insulin: Secondary | ICD-10-CM

## 2021-04-25 DIAGNOSIS — E1142 Type 2 diabetes mellitus with diabetic polyneuropathy: Secondary | ICD-10-CM

## 2021-04-25 MED ORDER — DEXCOM G6 TRANSMITTER MISC: 1.0000 | Freq: Once | 1 refills | Status: DC

## 2021-04-27 DIAGNOSIS — E11621 Type 2 diabetes mellitus with foot ulcer: Secondary | ICD-10-CM | POA: Diagnosis not present

## 2021-04-27 DIAGNOSIS — L97429 Non-pressure chronic ulcer of left heel and midfoot with unspecified severity: Secondary | ICD-10-CM | POA: Diagnosis not present

## 2021-04-27 DIAGNOSIS — E11610 Type 2 diabetes mellitus with diabetic neuropathic arthropathy: Secondary | ICD-10-CM | POA: Diagnosis not present

## 2021-05-11 DIAGNOSIS — M25511 Pain in right shoulder: Secondary | ICD-10-CM | POA: Diagnosis not present

## 2021-05-11 DIAGNOSIS — Z79899 Other long term (current) drug therapy: Secondary | ICD-10-CM | POA: Diagnosis not present

## 2021-05-11 DIAGNOSIS — M549 Dorsalgia, unspecified: Secondary | ICD-10-CM | POA: Diagnosis not present

## 2021-05-11 DIAGNOSIS — L97429 Non-pressure chronic ulcer of left heel and midfoot with unspecified severity: Secondary | ICD-10-CM | POA: Diagnosis not present

## 2021-05-11 DIAGNOSIS — E11610 Type 2 diabetes mellitus with diabetic neuropathic arthropathy: Secondary | ICD-10-CM | POA: Diagnosis not present

## 2021-05-11 DIAGNOSIS — Z5181 Encounter for therapeutic drug level monitoring: Secondary | ICD-10-CM | POA: Diagnosis not present

## 2021-05-11 DIAGNOSIS — G894 Chronic pain syndrome: Secondary | ICD-10-CM | POA: Diagnosis not present

## 2021-05-11 DIAGNOSIS — E11621 Type 2 diabetes mellitus with foot ulcer: Secondary | ICD-10-CM | POA: Diagnosis not present

## 2021-05-11 DIAGNOSIS — E1143 Type 2 diabetes mellitus with diabetic autonomic (poly)neuropathy: Secondary | ICD-10-CM | POA: Diagnosis not present

## 2021-05-25 DIAGNOSIS — E11621 Type 2 diabetes mellitus with foot ulcer: Secondary | ICD-10-CM | POA: Diagnosis not present

## 2021-05-25 DIAGNOSIS — E11610 Type 2 diabetes mellitus with diabetic neuropathic arthropathy: Secondary | ICD-10-CM | POA: Diagnosis not present

## 2021-05-25 DIAGNOSIS — L97429 Non-pressure chronic ulcer of left heel and midfoot with unspecified severity: Secondary | ICD-10-CM | POA: Diagnosis not present

## 2021-05-26 DIAGNOSIS — E11621 Type 2 diabetes mellitus with foot ulcer: Secondary | ICD-10-CM | POA: Diagnosis not present

## 2021-05-26 DIAGNOSIS — M25475 Effusion, left foot: Secondary | ICD-10-CM | POA: Diagnosis not present

## 2021-05-26 DIAGNOSIS — M19072 Primary osteoarthritis, left ankle and foot: Secondary | ICD-10-CM | POA: Diagnosis not present

## 2021-05-26 DIAGNOSIS — E11610 Type 2 diabetes mellitus with diabetic neuropathic arthropathy: Secondary | ICD-10-CM | POA: Diagnosis not present

## 2021-05-26 DIAGNOSIS — L97429 Non-pressure chronic ulcer of left heel and midfoot with unspecified severity: Secondary | ICD-10-CM | POA: Diagnosis not present

## 2021-05-29 DIAGNOSIS — L97429 Non-pressure chronic ulcer of left heel and midfoot with unspecified severity: Secondary | ICD-10-CM | POA: Diagnosis not present

## 2021-05-29 DIAGNOSIS — E11610 Type 2 diabetes mellitus with diabetic neuropathic arthropathy: Secondary | ICD-10-CM | POA: Diagnosis not present

## 2021-05-29 DIAGNOSIS — E11621 Type 2 diabetes mellitus with foot ulcer: Secondary | ICD-10-CM | POA: Diagnosis not present

## 2021-05-29 DIAGNOSIS — E1143 Type 2 diabetes mellitus with diabetic autonomic (poly)neuropathy: Secondary | ICD-10-CM | POA: Diagnosis not present

## 2021-06-12 DIAGNOSIS — L97429 Non-pressure chronic ulcer of left heel and midfoot with unspecified severity: Secondary | ICD-10-CM | POA: Diagnosis not present

## 2021-06-12 DIAGNOSIS — E11621 Type 2 diabetes mellitus with foot ulcer: Secondary | ICD-10-CM | POA: Diagnosis not present

## 2021-06-12 DIAGNOSIS — E11610 Type 2 diabetes mellitus with diabetic neuropathic arthropathy: Secondary | ICD-10-CM | POA: Diagnosis not present

## 2021-06-12 DIAGNOSIS — E1143 Type 2 diabetes mellitus with diabetic autonomic (poly)neuropathy: Secondary | ICD-10-CM | POA: Diagnosis not present

## 2021-06-13 ENCOUNTER — Ambulatory Visit: Payer: BC Managed Care – PPO | Admitting: Gastroenterology

## 2021-06-13 ENCOUNTER — Encounter: Payer: Self-pay | Admitting: Gastroenterology

## 2021-06-13 VITALS — BP 150/80 | HR 80 | Ht 68.0 in | Wt 250.0 lb

## 2021-06-13 DIAGNOSIS — R1013 Epigastric pain: Secondary | ICD-10-CM

## 2021-06-13 DIAGNOSIS — K529 Noninfective gastroenteritis and colitis, unspecified: Secondary | ICD-10-CM

## 2021-06-13 DIAGNOSIS — R112 Nausea with vomiting, unspecified: Secondary | ICD-10-CM

## 2021-06-13 DIAGNOSIS — R140 Abdominal distension (gaseous): Secondary | ICD-10-CM

## 2021-06-13 MED ORDER — HYOSCYAMINE SULFATE 0.125 MG SL SUBL: 0.1250 mg | SUBLINGUAL_TABLET | SUBLINGUAL | 0 refills | Status: DC | PRN

## 2021-06-13 MED ORDER — PLENVU 140 G PO SOLR: 140.0000 g | ORAL | 0 refills | Status: DC

## 2021-06-13 NOTE — Patient Instructions (Addendum)
If you are age 55 or older, your body mass index should be between 23-30. Your Body mass index is 38.01 kg/m. If this is out of the aforementioned range listed, please consider follow up with your Primary Care Provider.  If you are age 66 or younger, your body mass index should be between 19-25. Your Body mass index is 38.01 kg/m. If this is out of the aformentioned range listed, please consider follow up with your Primary Care Provider.   ________________________________________________________  The [facility] providers would like to encourage you to use [facility] to communicate with providers for non-urgent requests or questions.  Due to long hold times on the telephone, sending your provider a message by [facility] may be a faster and more efficient way to get a response.  Please allow 48 business hours for a response.  Please remember that this is for non-urgent requests.  _______________________________________________________  Donna Kidd have been scheduled for a colonoscopy. Please follow written instructions given to you at your visit today.  Please pick up your prep supplies at the pharmacy within the next 1-3 days. If you use inhalers (even only as needed), please bring them with you on the day of your procedure.  It was a pleasure to see you today!  Thank you for trusting me with your gastrointestinal care!
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
## 2021-06-19 DIAGNOSIS — L97429 Non-pressure chronic ulcer of left heel and midfoot with unspecified severity: Secondary | ICD-10-CM | POA: Diagnosis not present

## 2021-06-19 DIAGNOSIS — E11621 Type 2 diabetes mellitus with foot ulcer: Secondary | ICD-10-CM | POA: Diagnosis not present

## 2021-06-27 DIAGNOSIS — L97409 Non-pressure chronic ulcer of unspecified heel and midfoot with unspecified severity: Secondary | ICD-10-CM | POA: Diagnosis not present

## 2021-06-27 DIAGNOSIS — E11610 Type 2 diabetes mellitus with diabetic neuropathic arthropathy: Secondary | ICD-10-CM | POA: Diagnosis not present

## 2021-06-27 DIAGNOSIS — E11621 Type 2 diabetes mellitus with foot ulcer: Secondary | ICD-10-CM | POA: Diagnosis not present

## 2021-06-30 ENCOUNTER — Ambulatory Visit: Payer: BC Managed Care – PPO | Admitting: Endocrinology

## 2021-07-11 DIAGNOSIS — L97422 Non-pressure chronic ulcer of left heel and midfoot with fat layer exposed: Secondary | ICD-10-CM | POA: Diagnosis not present

## 2021-07-11 DIAGNOSIS — E11621 Type 2 diabetes mellitus with foot ulcer: Secondary | ICD-10-CM | POA: Diagnosis not present

## 2021-07-11 DIAGNOSIS — S90822A Blister (nonthermal), left foot, initial encounter: Secondary | ICD-10-CM | POA: Diagnosis not present

## 2021-07-13 DIAGNOSIS — Z01419 Encounter for gynecological examination (general) (routine) without abnormal findings: Secondary | ICD-10-CM | POA: Diagnosis not present

## 2021-07-13 DIAGNOSIS — N898 Other specified noninflammatory disorders of vagina: Secondary | ICD-10-CM | POA: Diagnosis not present

## 2021-07-13 DIAGNOSIS — R102 Pelvic and perineal pain: Secondary | ICD-10-CM | POA: Diagnosis not present

## 2021-07-18 DIAGNOSIS — D62 Acute posthemorrhagic anemia: Secondary | ICD-10-CM | POA: Diagnosis not present

## 2021-07-18 DIAGNOSIS — Z1639 Resistance to other specified antimicrobial drug: Secondary | ICD-10-CM | POA: Diagnosis not present

## 2021-07-18 DIAGNOSIS — E11610 Type 2 diabetes mellitus with diabetic neuropathic arthropathy: Secondary | ICD-10-CM | POA: Diagnosis not present

## 2021-07-18 DIAGNOSIS — N179 Acute kidney failure, unspecified: Secondary | ICD-10-CM | POA: Diagnosis not present

## 2021-07-18 DIAGNOSIS — R079 Chest pain, unspecified: Secondary | ICD-10-CM | POA: Diagnosis not present

## 2021-07-18 DIAGNOSIS — L97429 Non-pressure chronic ulcer of left heel and midfoot with unspecified severity: Secondary | ICD-10-CM | POA: Diagnosis not present

## 2021-07-18 DIAGNOSIS — L03116 Cellulitis of left lower limb: Secondary | ICD-10-CM | POA: Diagnosis not present

## 2021-07-18 DIAGNOSIS — R600 Localized edema: Secondary | ICD-10-CM | POA: Diagnosis not present

## 2021-07-18 DIAGNOSIS — A4101 Sepsis due to Methicillin susceptible Staphylococcus aureus: Secondary | ICD-10-CM | POA: Diagnosis not present

## 2021-07-18 DIAGNOSIS — L03032 Cellulitis of left toe: Secondary | ICD-10-CM | POA: Diagnosis not present

## 2021-07-18 DIAGNOSIS — E11622 Type 2 diabetes mellitus with other skin ulcer: Secondary | ICD-10-CM | POA: Diagnosis not present

## 2021-07-18 DIAGNOSIS — E11621 Type 2 diabetes mellitus with foot ulcer: Secondary | ICD-10-CM | POA: Diagnosis not present

## 2021-07-18 DIAGNOSIS — I82622 Acute embolism and thrombosis of deep veins of left upper extremity: Secondary | ICD-10-CM | POA: Diagnosis not present

## 2021-07-18 DIAGNOSIS — R937 Abnormal findings on diagnostic imaging of other parts of musculoskeletal system: Secondary | ICD-10-CM | POA: Diagnosis not present

## 2021-07-18 DIAGNOSIS — E119 Type 2 diabetes mellitus without complications: Secondary | ICD-10-CM | POA: Diagnosis not present

## 2021-07-18 DIAGNOSIS — S90822A Blister (nonthermal), left foot, initial encounter: Secondary | ICD-10-CM | POA: Diagnosis not present

## 2021-07-18 DIAGNOSIS — L02612 Cutaneous abscess of left foot: Secondary | ICD-10-CM | POA: Insufficient documentation

## 2021-07-18 DIAGNOSIS — J986 Disorders of diaphragm: Secondary | ICD-10-CM | POA: Diagnosis not present

## 2021-07-18 DIAGNOSIS — E1165 Type 2 diabetes mellitus with hyperglycemia: Secondary | ICD-10-CM | POA: Diagnosis not present

## 2021-07-18 DIAGNOSIS — E872 Acidosis, unspecified: Secondary | ICD-10-CM | POA: Diagnosis not present

## 2021-07-18 DIAGNOSIS — E11628 Type 2 diabetes mellitus with other skin complications: Secondary | ICD-10-CM | POA: Diagnosis not present

## 2021-07-18 DIAGNOSIS — L089 Local infection of the skin and subcutaneous tissue, unspecified: Secondary | ICD-10-CM | POA: Diagnosis not present

## 2021-07-18 DIAGNOSIS — E871 Hypo-osmolality and hyponatremia: Secondary | ICD-10-CM | POA: Diagnosis not present

## 2021-07-18 DIAGNOSIS — L97529 Non-pressure chronic ulcer of other part of left foot with unspecified severity: Secondary | ICD-10-CM | POA: Diagnosis not present

## 2021-07-18 DIAGNOSIS — K760 Fatty (change of) liver, not elsewhere classified: Secondary | ICD-10-CM | POA: Diagnosis not present

## 2021-07-18 DIAGNOSIS — R6520 Severe sepsis without septic shock: Secondary | ICD-10-CM | POA: Diagnosis not present

## 2021-08-09 DIAGNOSIS — L03116 Cellulitis of left lower limb: Secondary | ICD-10-CM | POA: Diagnosis not present

## 2021-08-09 DIAGNOSIS — L02612 Cutaneous abscess of left foot: Secondary | ICD-10-CM | POA: Diagnosis not present

## 2021-08-09 DIAGNOSIS — E1165 Type 2 diabetes mellitus with hyperglycemia: Secondary | ICD-10-CM | POA: Diagnosis not present

## 2021-08-09 DIAGNOSIS — F419 Anxiety disorder, unspecified: Secondary | ICD-10-CM | POA: Diagnosis not present

## 2021-08-09 DIAGNOSIS — F4310 Post-traumatic stress disorder, unspecified: Secondary | ICD-10-CM | POA: Diagnosis not present

## 2021-08-09 DIAGNOSIS — I82622 Acute embolism and thrombosis of deep veins of left upper extremity: Secondary | ICD-10-CM | POA: Diagnosis not present

## 2021-08-09 DIAGNOSIS — A419 Sepsis, unspecified organism: Secondary | ICD-10-CM | POA: Diagnosis not present

## 2021-08-09 DIAGNOSIS — E11628 Type 2 diabetes mellitus with other skin complications: Secondary | ICD-10-CM | POA: Diagnosis not present

## 2021-08-09 DIAGNOSIS — I10 Essential (primary) hypertension: Secondary | ICD-10-CM | POA: Diagnosis not present

## 2021-08-09 DIAGNOSIS — N179 Acute kidney failure, unspecified: Secondary | ICD-10-CM | POA: Diagnosis not present

## 2021-08-09 DIAGNOSIS — E1140 Type 2 diabetes mellitus with diabetic neuropathy, unspecified: Secondary | ICD-10-CM | POA: Diagnosis not present

## 2021-08-09 DIAGNOSIS — E11610 Type 2 diabetes mellitus with diabetic neuropathic arthropathy: Secondary | ICD-10-CM | POA: Diagnosis not present

## 2021-08-09 DIAGNOSIS — F32A Depression, unspecified: Secondary | ICD-10-CM | POA: Diagnosis not present

## 2021-08-10 DIAGNOSIS — T8131XA Disruption of external operation (surgical) wound, not elsewhere classified, initial encounter: Secondary | ICD-10-CM | POA: Diagnosis not present

## 2021-08-10 DIAGNOSIS — E11622 Type 2 diabetes mellitus with other skin ulcer: Secondary | ICD-10-CM | POA: Diagnosis not present

## 2021-08-11 DIAGNOSIS — S91302A Unspecified open wound, left foot, initial encounter: Secondary | ICD-10-CM | POA: Diagnosis not present

## 2021-08-12 DIAGNOSIS — N179 Acute kidney failure, unspecified: Secondary | ICD-10-CM | POA: Diagnosis not present

## 2021-08-12 DIAGNOSIS — L02612 Cutaneous abscess of left foot: Secondary | ICD-10-CM | POA: Diagnosis not present

## 2021-08-12 DIAGNOSIS — A419 Sepsis, unspecified organism: Secondary | ICD-10-CM | POA: Diagnosis not present

## 2021-08-12 DIAGNOSIS — E1140 Type 2 diabetes mellitus with diabetic neuropathy, unspecified: Secondary | ICD-10-CM | POA: Diagnosis not present

## 2021-08-12 DIAGNOSIS — E1165 Type 2 diabetes mellitus with hyperglycemia: Secondary | ICD-10-CM | POA: Diagnosis not present

## 2021-08-12 DIAGNOSIS — F4310 Post-traumatic stress disorder, unspecified: Secondary | ICD-10-CM | POA: Diagnosis not present

## 2021-08-12 DIAGNOSIS — E11610 Type 2 diabetes mellitus with diabetic neuropathic arthropathy: Secondary | ICD-10-CM | POA: Diagnosis not present

## 2021-08-12 DIAGNOSIS — F32A Depression, unspecified: Secondary | ICD-10-CM | POA: Diagnosis not present

## 2021-08-12 DIAGNOSIS — E11628 Type 2 diabetes mellitus with other skin complications: Secondary | ICD-10-CM | POA: Diagnosis not present

## 2021-08-12 DIAGNOSIS — I82622 Acute embolism and thrombosis of deep veins of left upper extremity: Secondary | ICD-10-CM | POA: Diagnosis not present

## 2021-08-12 DIAGNOSIS — F419 Anxiety disorder, unspecified: Secondary | ICD-10-CM | POA: Diagnosis not present

## 2021-08-12 DIAGNOSIS — I10 Essential (primary) hypertension: Secondary | ICD-10-CM | POA: Diagnosis not present

## 2021-08-12 DIAGNOSIS — L03116 Cellulitis of left lower limb: Secondary | ICD-10-CM | POA: Diagnosis not present

## 2021-08-14 ENCOUNTER — Encounter: Payer: Self-pay | Admitting: Gastroenterology

## 2021-08-17 DIAGNOSIS — E1165 Type 2 diabetes mellitus with hyperglycemia: Secondary | ICD-10-CM | POA: Diagnosis not present

## 2021-08-17 DIAGNOSIS — L03116 Cellulitis of left lower limb: Secondary | ICD-10-CM | POA: Diagnosis not present

## 2021-08-17 DIAGNOSIS — L02612 Cutaneous abscess of left foot: Secondary | ICD-10-CM | POA: Diagnosis not present

## 2021-08-17 DIAGNOSIS — F419 Anxiety disorder, unspecified: Secondary | ICD-10-CM | POA: Diagnosis not present

## 2021-08-17 DIAGNOSIS — E11628 Type 2 diabetes mellitus with other skin complications: Secondary | ICD-10-CM | POA: Diagnosis not present

## 2021-08-17 DIAGNOSIS — E11610 Type 2 diabetes mellitus with diabetic neuropathic arthropathy: Secondary | ICD-10-CM | POA: Diagnosis not present

## 2021-08-17 DIAGNOSIS — F32A Depression, unspecified: Secondary | ICD-10-CM | POA: Diagnosis not present

## 2021-08-17 DIAGNOSIS — N179 Acute kidney failure, unspecified: Secondary | ICD-10-CM | POA: Diagnosis not present

## 2021-08-17 DIAGNOSIS — E1140 Type 2 diabetes mellitus with diabetic neuropathy, unspecified: Secondary | ICD-10-CM | POA: Diagnosis not present

## 2021-08-17 DIAGNOSIS — F4310 Post-traumatic stress disorder, unspecified: Secondary | ICD-10-CM | POA: Diagnosis not present

## 2021-08-17 DIAGNOSIS — A419 Sepsis, unspecified organism: Secondary | ICD-10-CM | POA: Diagnosis not present

## 2021-08-17 DIAGNOSIS — I10 Essential (primary) hypertension: Secondary | ICD-10-CM | POA: Diagnosis not present

## 2021-08-17 DIAGNOSIS — I82622 Acute embolism and thrombosis of deep veins of left upper extremity: Secondary | ICD-10-CM | POA: Diagnosis not present

## 2021-08-19 DIAGNOSIS — L02612 Cutaneous abscess of left foot: Secondary | ICD-10-CM | POA: Diagnosis not present

## 2021-08-19 DIAGNOSIS — N179 Acute kidney failure, unspecified: Secondary | ICD-10-CM | POA: Diagnosis not present

## 2021-08-19 DIAGNOSIS — L03116 Cellulitis of left lower limb: Secondary | ICD-10-CM | POA: Diagnosis not present

## 2021-08-19 DIAGNOSIS — F32A Depression, unspecified: Secondary | ICD-10-CM | POA: Diagnosis not present

## 2021-08-19 DIAGNOSIS — I10 Essential (primary) hypertension: Secondary | ICD-10-CM | POA: Diagnosis not present

## 2021-08-19 DIAGNOSIS — A419 Sepsis, unspecified organism: Secondary | ICD-10-CM | POA: Diagnosis not present

## 2021-08-19 DIAGNOSIS — F419 Anxiety disorder, unspecified: Secondary | ICD-10-CM | POA: Diagnosis not present

## 2021-08-19 DIAGNOSIS — E1165 Type 2 diabetes mellitus with hyperglycemia: Secondary | ICD-10-CM | POA: Diagnosis not present

## 2021-08-19 DIAGNOSIS — E11610 Type 2 diabetes mellitus with diabetic neuropathic arthropathy: Secondary | ICD-10-CM | POA: Diagnosis not present

## 2021-08-19 DIAGNOSIS — E11628 Type 2 diabetes mellitus with other skin complications: Secondary | ICD-10-CM | POA: Diagnosis not present

## 2021-08-19 DIAGNOSIS — I82622 Acute embolism and thrombosis of deep veins of left upper extremity: Secondary | ICD-10-CM | POA: Diagnosis not present

## 2021-08-19 DIAGNOSIS — F4310 Post-traumatic stress disorder, unspecified: Secondary | ICD-10-CM | POA: Diagnosis not present

## 2021-08-19 DIAGNOSIS — E1140 Type 2 diabetes mellitus with diabetic neuropathy, unspecified: Secondary | ICD-10-CM | POA: Diagnosis not present

## 2021-08-22 DIAGNOSIS — E1140 Type 2 diabetes mellitus with diabetic neuropathy, unspecified: Secondary | ICD-10-CM | POA: Diagnosis not present

## 2021-08-22 DIAGNOSIS — E1165 Type 2 diabetes mellitus with hyperglycemia: Secondary | ICD-10-CM | POA: Diagnosis not present

## 2021-08-22 DIAGNOSIS — L03116 Cellulitis of left lower limb: Secondary | ICD-10-CM | POA: Diagnosis not present

## 2021-08-22 DIAGNOSIS — F4310 Post-traumatic stress disorder, unspecified: Secondary | ICD-10-CM | POA: Diagnosis not present

## 2021-08-22 DIAGNOSIS — E11610 Type 2 diabetes mellitus with diabetic neuropathic arthropathy: Secondary | ICD-10-CM | POA: Diagnosis not present

## 2021-08-22 DIAGNOSIS — I10 Essential (primary) hypertension: Secondary | ICD-10-CM | POA: Diagnosis not present

## 2021-08-22 DIAGNOSIS — F419 Anxiety disorder, unspecified: Secondary | ICD-10-CM | POA: Diagnosis not present

## 2021-08-22 DIAGNOSIS — A419 Sepsis, unspecified organism: Secondary | ICD-10-CM | POA: Diagnosis not present

## 2021-08-22 DIAGNOSIS — F32A Depression, unspecified: Secondary | ICD-10-CM | POA: Diagnosis not present

## 2021-08-22 DIAGNOSIS — N179 Acute kidney failure, unspecified: Secondary | ICD-10-CM | POA: Diagnosis not present

## 2021-08-22 DIAGNOSIS — I82622 Acute embolism and thrombosis of deep veins of left upper extremity: Secondary | ICD-10-CM | POA: Diagnosis not present

## 2021-08-22 DIAGNOSIS — E11628 Type 2 diabetes mellitus with other skin complications: Secondary | ICD-10-CM | POA: Diagnosis not present

## 2021-08-22 DIAGNOSIS — L02612 Cutaneous abscess of left foot: Secondary | ICD-10-CM | POA: Diagnosis not present

## 2021-08-23 ENCOUNTER — Encounter: Payer: BC Managed Care – PPO | Admitting: Gastroenterology

## 2021-08-26 DIAGNOSIS — I82622 Acute embolism and thrombosis of deep veins of left upper extremity: Secondary | ICD-10-CM | POA: Diagnosis not present

## 2021-08-26 DIAGNOSIS — E11610 Type 2 diabetes mellitus with diabetic neuropathic arthropathy: Secondary | ICD-10-CM | POA: Diagnosis not present

## 2021-08-26 DIAGNOSIS — F32A Depression, unspecified: Secondary | ICD-10-CM | POA: Diagnosis not present

## 2021-08-26 DIAGNOSIS — E1165 Type 2 diabetes mellitus with hyperglycemia: Secondary | ICD-10-CM | POA: Diagnosis not present

## 2021-08-26 DIAGNOSIS — E11628 Type 2 diabetes mellitus with other skin complications: Secondary | ICD-10-CM | POA: Diagnosis not present

## 2021-08-26 DIAGNOSIS — F4310 Post-traumatic stress disorder, unspecified: Secondary | ICD-10-CM | POA: Diagnosis not present

## 2021-08-26 DIAGNOSIS — L03116 Cellulitis of left lower limb: Secondary | ICD-10-CM | POA: Diagnosis not present

## 2021-08-26 DIAGNOSIS — I10 Essential (primary) hypertension: Secondary | ICD-10-CM | POA: Diagnosis not present

## 2021-08-26 DIAGNOSIS — F419 Anxiety disorder, unspecified: Secondary | ICD-10-CM | POA: Diagnosis not present

## 2021-08-26 DIAGNOSIS — E1140 Type 2 diabetes mellitus with diabetic neuropathy, unspecified: Secondary | ICD-10-CM | POA: Diagnosis not present

## 2021-08-26 DIAGNOSIS — L02612 Cutaneous abscess of left foot: Secondary | ICD-10-CM | POA: Diagnosis not present

## 2021-08-26 DIAGNOSIS — A419 Sepsis, unspecified organism: Secondary | ICD-10-CM | POA: Diagnosis not present

## 2021-08-26 DIAGNOSIS — N179 Acute kidney failure, unspecified: Secondary | ICD-10-CM | POA: Diagnosis not present

## 2021-09-01 DIAGNOSIS — L02612 Cutaneous abscess of left foot: Secondary | ICD-10-CM | POA: Diagnosis not present

## 2021-09-01 DIAGNOSIS — E1140 Type 2 diabetes mellitus with diabetic neuropathy, unspecified: Secondary | ICD-10-CM | POA: Diagnosis not present

## 2021-09-01 DIAGNOSIS — I82622 Acute embolism and thrombosis of deep veins of left upper extremity: Secondary | ICD-10-CM | POA: Diagnosis not present

## 2021-09-01 DIAGNOSIS — L03116 Cellulitis of left lower limb: Secondary | ICD-10-CM | POA: Diagnosis not present

## 2021-09-01 DIAGNOSIS — N179 Acute kidney failure, unspecified: Secondary | ICD-10-CM | POA: Diagnosis not present

## 2021-09-01 DIAGNOSIS — I10 Essential (primary) hypertension: Secondary | ICD-10-CM | POA: Diagnosis not present

## 2021-09-01 DIAGNOSIS — E11610 Type 2 diabetes mellitus with diabetic neuropathic arthropathy: Secondary | ICD-10-CM | POA: Diagnosis not present

## 2021-09-01 DIAGNOSIS — E1165 Type 2 diabetes mellitus with hyperglycemia: Secondary | ICD-10-CM | POA: Diagnosis not present

## 2021-09-01 DIAGNOSIS — A419 Sepsis, unspecified organism: Secondary | ICD-10-CM | POA: Diagnosis not present

## 2021-09-01 DIAGNOSIS — E11628 Type 2 diabetes mellitus with other skin complications: Secondary | ICD-10-CM | POA: Diagnosis not present

## 2021-09-01 DIAGNOSIS — F4310 Post-traumatic stress disorder, unspecified: Secondary | ICD-10-CM | POA: Diagnosis not present

## 2021-09-01 DIAGNOSIS — F32A Depression, unspecified: Secondary | ICD-10-CM | POA: Diagnosis not present

## 2021-09-01 DIAGNOSIS — F419 Anxiety disorder, unspecified: Secondary | ICD-10-CM | POA: Diagnosis not present

## 2021-09-05 DIAGNOSIS — E11610 Type 2 diabetes mellitus with diabetic neuropathic arthropathy: Secondary | ICD-10-CM | POA: Diagnosis not present

## 2021-09-05 DIAGNOSIS — N179 Acute kidney failure, unspecified: Secondary | ICD-10-CM | POA: Diagnosis not present

## 2021-09-05 DIAGNOSIS — E11628 Type 2 diabetes mellitus with other skin complications: Secondary | ICD-10-CM | POA: Diagnosis not present

## 2021-09-05 DIAGNOSIS — E1165 Type 2 diabetes mellitus with hyperglycemia: Secondary | ICD-10-CM | POA: Diagnosis not present

## 2021-09-05 DIAGNOSIS — L02612 Cutaneous abscess of left foot: Secondary | ICD-10-CM | POA: Diagnosis not present

## 2021-09-05 DIAGNOSIS — F419 Anxiety disorder, unspecified: Secondary | ICD-10-CM | POA: Diagnosis not present

## 2021-09-05 DIAGNOSIS — A419 Sepsis, unspecified organism: Secondary | ICD-10-CM | POA: Diagnosis not present

## 2021-09-05 DIAGNOSIS — I82622 Acute embolism and thrombosis of deep veins of left upper extremity: Secondary | ICD-10-CM | POA: Diagnosis not present

## 2021-09-05 DIAGNOSIS — I10 Essential (primary) hypertension: Secondary | ICD-10-CM | POA: Diagnosis not present

## 2021-09-05 DIAGNOSIS — F4310 Post-traumatic stress disorder, unspecified: Secondary | ICD-10-CM | POA: Diagnosis not present

## 2021-09-05 DIAGNOSIS — L03116 Cellulitis of left lower limb: Secondary | ICD-10-CM | POA: Diagnosis not present

## 2021-09-05 DIAGNOSIS — E1140 Type 2 diabetes mellitus with diabetic neuropathy, unspecified: Secondary | ICD-10-CM | POA: Diagnosis not present

## 2021-09-05 DIAGNOSIS — F32A Depression, unspecified: Secondary | ICD-10-CM | POA: Diagnosis not present

## 2021-09-08 DIAGNOSIS — F32A Depression, unspecified: Secondary | ICD-10-CM | POA: Diagnosis not present

## 2021-09-08 DIAGNOSIS — E1140 Type 2 diabetes mellitus with diabetic neuropathy, unspecified: Secondary | ICD-10-CM | POA: Diagnosis not present

## 2021-09-08 DIAGNOSIS — L02612 Cutaneous abscess of left foot: Secondary | ICD-10-CM | POA: Diagnosis not present

## 2021-09-08 DIAGNOSIS — E11610 Type 2 diabetes mellitus with diabetic neuropathic arthropathy: Secondary | ICD-10-CM | POA: Diagnosis not present

## 2021-09-08 DIAGNOSIS — E11628 Type 2 diabetes mellitus with other skin complications: Secondary | ICD-10-CM | POA: Diagnosis not present

## 2021-09-08 DIAGNOSIS — I10 Essential (primary) hypertension: Secondary | ICD-10-CM | POA: Diagnosis not present

## 2021-09-08 DIAGNOSIS — A419 Sepsis, unspecified organism: Secondary | ICD-10-CM | POA: Diagnosis not present

## 2021-09-08 DIAGNOSIS — I82622 Acute embolism and thrombosis of deep veins of left upper extremity: Secondary | ICD-10-CM | POA: Diagnosis not present

## 2021-09-08 DIAGNOSIS — N179 Acute kidney failure, unspecified: Secondary | ICD-10-CM | POA: Diagnosis not present

## 2021-09-08 DIAGNOSIS — L03116 Cellulitis of left lower limb: Secondary | ICD-10-CM | POA: Diagnosis not present

## 2021-09-08 DIAGNOSIS — F419 Anxiety disorder, unspecified: Secondary | ICD-10-CM | POA: Diagnosis not present

## 2021-09-08 DIAGNOSIS — F4310 Post-traumatic stress disorder, unspecified: Secondary | ICD-10-CM | POA: Diagnosis not present

## 2021-09-08 DIAGNOSIS — E1165 Type 2 diabetes mellitus with hyperglycemia: Secondary | ICD-10-CM | POA: Diagnosis not present

## 2021-09-10 DIAGNOSIS — T8131XA Disruption of external operation (surgical) wound, not elsewhere classified, initial encounter: Secondary | ICD-10-CM | POA: Diagnosis not present

## 2021-09-10 DIAGNOSIS — E11622 Type 2 diabetes mellitus with other skin ulcer: Secondary | ICD-10-CM | POA: Diagnosis not present

## 2021-09-15 DIAGNOSIS — F4310 Post-traumatic stress disorder, unspecified: Secondary | ICD-10-CM | POA: Diagnosis not present

## 2021-09-15 DIAGNOSIS — E11610 Type 2 diabetes mellitus with diabetic neuropathic arthropathy: Secondary | ICD-10-CM | POA: Diagnosis not present

## 2021-09-15 DIAGNOSIS — F32A Depression, unspecified: Secondary | ICD-10-CM | POA: Diagnosis not present

## 2021-09-15 DIAGNOSIS — L02612 Cutaneous abscess of left foot: Secondary | ICD-10-CM | POA: Diagnosis not present

## 2021-09-15 DIAGNOSIS — I10 Essential (primary) hypertension: Secondary | ICD-10-CM | POA: Diagnosis not present

## 2021-09-15 DIAGNOSIS — F419 Anxiety disorder, unspecified: Secondary | ICD-10-CM | POA: Diagnosis not present

## 2021-09-15 DIAGNOSIS — E11628 Type 2 diabetes mellitus with other skin complications: Secondary | ICD-10-CM | POA: Diagnosis not present

## 2021-09-15 DIAGNOSIS — E1140 Type 2 diabetes mellitus with diabetic neuropathy, unspecified: Secondary | ICD-10-CM | POA: Diagnosis not present

## 2021-09-15 DIAGNOSIS — N179 Acute kidney failure, unspecified: Secondary | ICD-10-CM | POA: Diagnosis not present

## 2021-09-15 DIAGNOSIS — L03116 Cellulitis of left lower limb: Secondary | ICD-10-CM | POA: Diagnosis not present

## 2021-09-15 DIAGNOSIS — I82622 Acute embolism and thrombosis of deep veins of left upper extremity: Secondary | ICD-10-CM | POA: Diagnosis not present

## 2021-09-15 DIAGNOSIS — E1165 Type 2 diabetes mellitus with hyperglycemia: Secondary | ICD-10-CM | POA: Diagnosis not present

## 2021-09-15 DIAGNOSIS — A419 Sepsis, unspecified organism: Secondary | ICD-10-CM | POA: Diagnosis not present

## 2021-09-19 DIAGNOSIS — F419 Anxiety disorder, unspecified: Secondary | ICD-10-CM | POA: Diagnosis not present

## 2021-09-19 DIAGNOSIS — I82622 Acute embolism and thrombosis of deep veins of left upper extremity: Secondary | ICD-10-CM | POA: Diagnosis not present

## 2021-09-19 DIAGNOSIS — E1165 Type 2 diabetes mellitus with hyperglycemia: Secondary | ICD-10-CM | POA: Diagnosis not present

## 2021-09-19 DIAGNOSIS — F4310 Post-traumatic stress disorder, unspecified: Secondary | ICD-10-CM | POA: Diagnosis not present

## 2021-09-19 DIAGNOSIS — L03116 Cellulitis of left lower limb: Secondary | ICD-10-CM | POA: Diagnosis not present

## 2021-09-19 DIAGNOSIS — E11610 Type 2 diabetes mellitus with diabetic neuropathic arthropathy: Secondary | ICD-10-CM | POA: Diagnosis not present

## 2021-09-19 DIAGNOSIS — E1140 Type 2 diabetes mellitus with diabetic neuropathy, unspecified: Secondary | ICD-10-CM | POA: Diagnosis not present

## 2021-09-19 DIAGNOSIS — E11628 Type 2 diabetes mellitus with other skin complications: Secondary | ICD-10-CM | POA: Diagnosis not present

## 2021-09-19 DIAGNOSIS — I10 Essential (primary) hypertension: Secondary | ICD-10-CM | POA: Diagnosis not present

## 2021-09-19 DIAGNOSIS — N179 Acute kidney failure, unspecified: Secondary | ICD-10-CM | POA: Diagnosis not present

## 2021-09-19 DIAGNOSIS — L02612 Cutaneous abscess of left foot: Secondary | ICD-10-CM | POA: Diagnosis not present

## 2021-09-19 DIAGNOSIS — F32A Depression, unspecified: Secondary | ICD-10-CM | POA: Diagnosis not present

## 2021-09-19 DIAGNOSIS — A419 Sepsis, unspecified organism: Secondary | ICD-10-CM | POA: Diagnosis not present

## 2021-09-22 DIAGNOSIS — E1165 Type 2 diabetes mellitus with hyperglycemia: Secondary | ICD-10-CM | POA: Diagnosis not present

## 2021-09-22 DIAGNOSIS — I82622 Acute embolism and thrombosis of deep veins of left upper extremity: Secondary | ICD-10-CM | POA: Diagnosis not present

## 2021-09-22 DIAGNOSIS — F32A Depression, unspecified: Secondary | ICD-10-CM | POA: Diagnosis not present

## 2021-09-22 DIAGNOSIS — E11610 Type 2 diabetes mellitus with diabetic neuropathic arthropathy: Secondary | ICD-10-CM | POA: Diagnosis not present

## 2021-09-22 DIAGNOSIS — I10 Essential (primary) hypertension: Secondary | ICD-10-CM | POA: Diagnosis not present

## 2021-09-22 DIAGNOSIS — L03116 Cellulitis of left lower limb: Secondary | ICD-10-CM | POA: Diagnosis not present

## 2021-09-22 DIAGNOSIS — E1140 Type 2 diabetes mellitus with diabetic neuropathy, unspecified: Secondary | ICD-10-CM | POA: Diagnosis not present

## 2021-09-22 DIAGNOSIS — F419 Anxiety disorder, unspecified: Secondary | ICD-10-CM | POA: Diagnosis not present

## 2021-09-22 DIAGNOSIS — A419 Sepsis, unspecified organism: Secondary | ICD-10-CM | POA: Diagnosis not present

## 2021-09-22 DIAGNOSIS — E11628 Type 2 diabetes mellitus with other skin complications: Secondary | ICD-10-CM | POA: Diagnosis not present

## 2021-09-22 DIAGNOSIS — N179 Acute kidney failure, unspecified: Secondary | ICD-10-CM | POA: Diagnosis not present

## 2021-09-22 DIAGNOSIS — L02612 Cutaneous abscess of left foot: Secondary | ICD-10-CM | POA: Diagnosis not present

## 2021-09-22 DIAGNOSIS — F4310 Post-traumatic stress disorder, unspecified: Secondary | ICD-10-CM | POA: Diagnosis not present

## 2021-09-25 DIAGNOSIS — T8131XA Disruption of external operation (surgical) wound, not elsewhere classified, initial encounter: Secondary | ICD-10-CM | POA: Diagnosis not present

## 2021-09-25 DIAGNOSIS — E11622 Type 2 diabetes mellitus with other skin ulcer: Secondary | ICD-10-CM | POA: Diagnosis not present

## 2021-09-26 DIAGNOSIS — I82622 Acute embolism and thrombosis of deep veins of left upper extremity: Secondary | ICD-10-CM | POA: Diagnosis not present

## 2021-09-26 DIAGNOSIS — E1140 Type 2 diabetes mellitus with diabetic neuropathy, unspecified: Secondary | ICD-10-CM | POA: Diagnosis not present

## 2021-09-26 DIAGNOSIS — F419 Anxiety disorder, unspecified: Secondary | ICD-10-CM | POA: Diagnosis not present

## 2021-09-26 DIAGNOSIS — E1165 Type 2 diabetes mellitus with hyperglycemia: Secondary | ICD-10-CM | POA: Diagnosis not present

## 2021-09-26 DIAGNOSIS — I10 Essential (primary) hypertension: Secondary | ICD-10-CM | POA: Diagnosis not present

## 2021-09-26 DIAGNOSIS — F32A Depression, unspecified: Secondary | ICD-10-CM | POA: Diagnosis not present

## 2021-09-26 DIAGNOSIS — N179 Acute kidney failure, unspecified: Secondary | ICD-10-CM | POA: Diagnosis not present

## 2021-09-26 DIAGNOSIS — L02612 Cutaneous abscess of left foot: Secondary | ICD-10-CM | POA: Diagnosis not present

## 2021-09-26 DIAGNOSIS — E11628 Type 2 diabetes mellitus with other skin complications: Secondary | ICD-10-CM | POA: Diagnosis not present

## 2021-09-26 DIAGNOSIS — A419 Sepsis, unspecified organism: Secondary | ICD-10-CM | POA: Diagnosis not present

## 2021-09-26 DIAGNOSIS — F4310 Post-traumatic stress disorder, unspecified: Secondary | ICD-10-CM | POA: Diagnosis not present

## 2021-09-26 DIAGNOSIS — E11610 Type 2 diabetes mellitus with diabetic neuropathic arthropathy: Secondary | ICD-10-CM | POA: Diagnosis not present

## 2021-09-26 DIAGNOSIS — L03116 Cellulitis of left lower limb: Secondary | ICD-10-CM | POA: Diagnosis not present

## 2021-09-29 DIAGNOSIS — E1140 Type 2 diabetes mellitus with diabetic neuropathy, unspecified: Secondary | ICD-10-CM | POA: Diagnosis not present

## 2021-09-29 DIAGNOSIS — F4310 Post-traumatic stress disorder, unspecified: Secondary | ICD-10-CM | POA: Diagnosis not present

## 2021-09-29 DIAGNOSIS — E11628 Type 2 diabetes mellitus with other skin complications: Secondary | ICD-10-CM | POA: Diagnosis not present

## 2021-09-29 DIAGNOSIS — F32A Depression, unspecified: Secondary | ICD-10-CM | POA: Diagnosis not present

## 2021-09-29 DIAGNOSIS — L02612 Cutaneous abscess of left foot: Secondary | ICD-10-CM | POA: Diagnosis not present

## 2021-09-29 DIAGNOSIS — N179 Acute kidney failure, unspecified: Secondary | ICD-10-CM | POA: Diagnosis not present

## 2021-09-29 DIAGNOSIS — L03116 Cellulitis of left lower limb: Secondary | ICD-10-CM | POA: Diagnosis not present

## 2021-09-29 DIAGNOSIS — F419 Anxiety disorder, unspecified: Secondary | ICD-10-CM | POA: Diagnosis not present

## 2021-09-29 DIAGNOSIS — E1165 Type 2 diabetes mellitus with hyperglycemia: Secondary | ICD-10-CM | POA: Diagnosis not present

## 2021-09-29 DIAGNOSIS — E11610 Type 2 diabetes mellitus with diabetic neuropathic arthropathy: Secondary | ICD-10-CM | POA: Diagnosis not present

## 2021-09-29 DIAGNOSIS — I82622 Acute embolism and thrombosis of deep veins of left upper extremity: Secondary | ICD-10-CM | POA: Diagnosis not present

## 2021-09-29 DIAGNOSIS — A419 Sepsis, unspecified organism: Secondary | ICD-10-CM | POA: Diagnosis not present

## 2021-09-29 DIAGNOSIS — I10 Essential (primary) hypertension: Secondary | ICD-10-CM | POA: Diagnosis not present

## 2021-10-03 DIAGNOSIS — A419 Sepsis, unspecified organism: Secondary | ICD-10-CM | POA: Diagnosis not present

## 2021-10-03 DIAGNOSIS — I10 Essential (primary) hypertension: Secondary | ICD-10-CM | POA: Diagnosis not present

## 2021-10-03 DIAGNOSIS — L03116 Cellulitis of left lower limb: Secondary | ICD-10-CM | POA: Diagnosis not present

## 2021-10-03 DIAGNOSIS — E11610 Type 2 diabetes mellitus with diabetic neuropathic arthropathy: Secondary | ICD-10-CM | POA: Diagnosis not present

## 2021-10-03 DIAGNOSIS — N179 Acute kidney failure, unspecified: Secondary | ICD-10-CM | POA: Diagnosis not present

## 2021-10-03 DIAGNOSIS — I82622 Acute embolism and thrombosis of deep veins of left upper extremity: Secondary | ICD-10-CM | POA: Diagnosis not present

## 2021-10-03 DIAGNOSIS — E1140 Type 2 diabetes mellitus with diabetic neuropathy, unspecified: Secondary | ICD-10-CM | POA: Diagnosis not present

## 2021-10-03 DIAGNOSIS — E1165 Type 2 diabetes mellitus with hyperglycemia: Secondary | ICD-10-CM | POA: Diagnosis not present

## 2021-10-03 DIAGNOSIS — S91302A Unspecified open wound, left foot, initial encounter: Secondary | ICD-10-CM | POA: Diagnosis not present

## 2021-10-03 DIAGNOSIS — L02612 Cutaneous abscess of left foot: Secondary | ICD-10-CM | POA: Diagnosis not present

## 2021-10-03 DIAGNOSIS — F4310 Post-traumatic stress disorder, unspecified: Secondary | ICD-10-CM | POA: Diagnosis not present

## 2021-10-03 DIAGNOSIS — F419 Anxiety disorder, unspecified: Secondary | ICD-10-CM | POA: Diagnosis not present

## 2021-10-03 DIAGNOSIS — F32A Depression, unspecified: Secondary | ICD-10-CM | POA: Diagnosis not present

## 2021-10-03 DIAGNOSIS — E11628 Type 2 diabetes mellitus with other skin complications: Secondary | ICD-10-CM | POA: Diagnosis not present

## 2021-10-03 DIAGNOSIS — Z4801 Encounter for change or removal of surgical wound dressing: Secondary | ICD-10-CM | POA: Diagnosis not present

## 2021-10-06 DIAGNOSIS — N179 Acute kidney failure, unspecified: Secondary | ICD-10-CM | POA: Diagnosis not present

## 2021-10-06 DIAGNOSIS — E1165 Type 2 diabetes mellitus with hyperglycemia: Secondary | ICD-10-CM | POA: Diagnosis not present

## 2021-10-06 DIAGNOSIS — A419 Sepsis, unspecified organism: Secondary | ICD-10-CM | POA: Diagnosis not present

## 2021-10-06 DIAGNOSIS — T8131XA Disruption of external operation (surgical) wound, not elsewhere classified, initial encounter: Secondary | ICD-10-CM | POA: Diagnosis not present

## 2021-10-06 DIAGNOSIS — E11610 Type 2 diabetes mellitus with diabetic neuropathic arthropathy: Secondary | ICD-10-CM | POA: Diagnosis not present

## 2021-10-06 DIAGNOSIS — E11628 Type 2 diabetes mellitus with other skin complications: Secondary | ICD-10-CM | POA: Diagnosis not present

## 2021-10-06 DIAGNOSIS — I10 Essential (primary) hypertension: Secondary | ICD-10-CM | POA: Diagnosis not present

## 2021-10-06 DIAGNOSIS — F32A Depression, unspecified: Secondary | ICD-10-CM | POA: Diagnosis not present

## 2021-10-06 DIAGNOSIS — F419 Anxiety disorder, unspecified: Secondary | ICD-10-CM | POA: Diagnosis not present

## 2021-10-06 DIAGNOSIS — L03116 Cellulitis of left lower limb: Secondary | ICD-10-CM | POA: Diagnosis not present

## 2021-10-06 DIAGNOSIS — E11622 Type 2 diabetes mellitus with other skin ulcer: Secondary | ICD-10-CM | POA: Diagnosis not present

## 2021-10-06 DIAGNOSIS — I82622 Acute embolism and thrombosis of deep veins of left upper extremity: Secondary | ICD-10-CM | POA: Diagnosis not present

## 2021-10-06 DIAGNOSIS — L02612 Cutaneous abscess of left foot: Secondary | ICD-10-CM | POA: Diagnosis not present

## 2021-10-06 DIAGNOSIS — F4310 Post-traumatic stress disorder, unspecified: Secondary | ICD-10-CM | POA: Diagnosis not present

## 2021-10-06 DIAGNOSIS — E1140 Type 2 diabetes mellitus with diabetic neuropathy, unspecified: Secondary | ICD-10-CM | POA: Diagnosis not present

## 2021-10-11 DIAGNOSIS — T8131XA Disruption of external operation (surgical) wound, not elsewhere classified, initial encounter: Secondary | ICD-10-CM | POA: Diagnosis not present

## 2021-10-11 DIAGNOSIS — E11622 Type 2 diabetes mellitus with other skin ulcer: Secondary | ICD-10-CM | POA: Diagnosis not present

## 2021-10-13 DIAGNOSIS — L03116 Cellulitis of left lower limb: Secondary | ICD-10-CM | POA: Diagnosis not present

## 2021-10-13 DIAGNOSIS — E11610 Type 2 diabetes mellitus with diabetic neuropathic arthropathy: Secondary | ICD-10-CM | POA: Diagnosis not present

## 2021-10-13 DIAGNOSIS — E1140 Type 2 diabetes mellitus with diabetic neuropathy, unspecified: Secondary | ICD-10-CM | POA: Diagnosis not present

## 2021-10-13 DIAGNOSIS — L02612 Cutaneous abscess of left foot: Secondary | ICD-10-CM | POA: Diagnosis not present

## 2021-10-13 DIAGNOSIS — I82622 Acute embolism and thrombosis of deep veins of left upper extremity: Secondary | ICD-10-CM | POA: Diagnosis not present

## 2021-10-13 DIAGNOSIS — E11628 Type 2 diabetes mellitus with other skin complications: Secondary | ICD-10-CM | POA: Diagnosis not present

## 2021-10-13 DIAGNOSIS — F32A Depression, unspecified: Secondary | ICD-10-CM | POA: Diagnosis not present

## 2021-10-13 DIAGNOSIS — E871 Hypo-osmolality and hyponatremia: Secondary | ICD-10-CM | POA: Diagnosis not present

## 2021-10-13 DIAGNOSIS — F4310 Post-traumatic stress disorder, unspecified: Secondary | ICD-10-CM | POA: Diagnosis not present

## 2021-10-13 DIAGNOSIS — I10 Essential (primary) hypertension: Secondary | ICD-10-CM | POA: Diagnosis not present

## 2021-10-13 DIAGNOSIS — D649 Anemia, unspecified: Secondary | ICD-10-CM | POA: Diagnosis not present

## 2021-10-13 DIAGNOSIS — F419 Anxiety disorder, unspecified: Secondary | ICD-10-CM | POA: Diagnosis not present

## 2021-10-13 DIAGNOSIS — E1165 Type 2 diabetes mellitus with hyperglycemia: Secondary | ICD-10-CM | POA: Diagnosis not present

## 2021-10-17 DIAGNOSIS — E11628 Type 2 diabetes mellitus with other skin complications: Secondary | ICD-10-CM | POA: Diagnosis not present

## 2021-10-17 DIAGNOSIS — D649 Anemia, unspecified: Secondary | ICD-10-CM | POA: Diagnosis not present

## 2021-10-17 DIAGNOSIS — E871 Hypo-osmolality and hyponatremia: Secondary | ICD-10-CM | POA: Diagnosis not present

## 2021-10-17 DIAGNOSIS — E1140 Type 2 diabetes mellitus with diabetic neuropathy, unspecified: Secondary | ICD-10-CM | POA: Diagnosis not present

## 2021-10-17 DIAGNOSIS — F32A Depression, unspecified: Secondary | ICD-10-CM | POA: Diagnosis not present

## 2021-10-17 DIAGNOSIS — F419 Anxiety disorder, unspecified: Secondary | ICD-10-CM | POA: Diagnosis not present

## 2021-10-17 DIAGNOSIS — L03116 Cellulitis of left lower limb: Secondary | ICD-10-CM | POA: Diagnosis not present

## 2021-10-17 DIAGNOSIS — I82622 Acute embolism and thrombosis of deep veins of left upper extremity: Secondary | ICD-10-CM | POA: Diagnosis not present

## 2021-10-17 DIAGNOSIS — E1165 Type 2 diabetes mellitus with hyperglycemia: Secondary | ICD-10-CM | POA: Diagnosis not present

## 2021-10-17 DIAGNOSIS — F4310 Post-traumatic stress disorder, unspecified: Secondary | ICD-10-CM | POA: Diagnosis not present

## 2021-10-17 DIAGNOSIS — L02612 Cutaneous abscess of left foot: Secondary | ICD-10-CM | POA: Diagnosis not present

## 2021-10-17 DIAGNOSIS — E11610 Type 2 diabetes mellitus with diabetic neuropathic arthropathy: Secondary | ICD-10-CM | POA: Diagnosis not present

## 2021-10-17 DIAGNOSIS — I10 Essential (primary) hypertension: Secondary | ICD-10-CM | POA: Diagnosis not present

## 2021-10-20 DIAGNOSIS — E871 Hypo-osmolality and hyponatremia: Secondary | ICD-10-CM | POA: Diagnosis not present

## 2021-10-20 DIAGNOSIS — D649 Anemia, unspecified: Secondary | ICD-10-CM | POA: Diagnosis not present

## 2021-10-20 DIAGNOSIS — E1140 Type 2 diabetes mellitus with diabetic neuropathy, unspecified: Secondary | ICD-10-CM | POA: Diagnosis not present

## 2021-10-20 DIAGNOSIS — F419 Anxiety disorder, unspecified: Secondary | ICD-10-CM | POA: Diagnosis not present

## 2021-10-20 DIAGNOSIS — I10 Essential (primary) hypertension: Secondary | ICD-10-CM | POA: Diagnosis not present

## 2021-10-20 DIAGNOSIS — E11628 Type 2 diabetes mellitus with other skin complications: Secondary | ICD-10-CM | POA: Diagnosis not present

## 2021-10-20 DIAGNOSIS — I82622 Acute embolism and thrombosis of deep veins of left upper extremity: Secondary | ICD-10-CM | POA: Diagnosis not present

## 2021-10-20 DIAGNOSIS — L03116 Cellulitis of left lower limb: Secondary | ICD-10-CM | POA: Diagnosis not present

## 2021-10-20 DIAGNOSIS — L02612 Cutaneous abscess of left foot: Secondary | ICD-10-CM | POA: Diagnosis not present

## 2021-10-20 DIAGNOSIS — F4310 Post-traumatic stress disorder, unspecified: Secondary | ICD-10-CM | POA: Diagnosis not present

## 2021-10-20 DIAGNOSIS — E11610 Type 2 diabetes mellitus with diabetic neuropathic arthropathy: Secondary | ICD-10-CM | POA: Diagnosis not present

## 2021-10-20 DIAGNOSIS — E1165 Type 2 diabetes mellitus with hyperglycemia: Secondary | ICD-10-CM | POA: Diagnosis not present

## 2021-10-20 DIAGNOSIS — F32A Depression, unspecified: Secondary | ICD-10-CM | POA: Diagnosis not present

## 2021-10-24 DIAGNOSIS — F32A Depression, unspecified: Secondary | ICD-10-CM | POA: Diagnosis not present

## 2021-10-24 DIAGNOSIS — E1165 Type 2 diabetes mellitus with hyperglycemia: Secondary | ICD-10-CM | POA: Diagnosis not present

## 2021-10-24 DIAGNOSIS — F4310 Post-traumatic stress disorder, unspecified: Secondary | ICD-10-CM | POA: Diagnosis not present

## 2021-10-24 DIAGNOSIS — F419 Anxiety disorder, unspecified: Secondary | ICD-10-CM | POA: Diagnosis not present

## 2021-10-24 DIAGNOSIS — E871 Hypo-osmolality and hyponatremia: Secondary | ICD-10-CM | POA: Diagnosis not present

## 2021-10-24 DIAGNOSIS — E11628 Type 2 diabetes mellitus with other skin complications: Secondary | ICD-10-CM | POA: Diagnosis not present

## 2021-10-24 DIAGNOSIS — L02612 Cutaneous abscess of left foot: Secondary | ICD-10-CM | POA: Diagnosis not present

## 2021-10-24 DIAGNOSIS — L03116 Cellulitis of left lower limb: Secondary | ICD-10-CM | POA: Diagnosis not present

## 2021-10-24 DIAGNOSIS — I82622 Acute embolism and thrombosis of deep veins of left upper extremity: Secondary | ICD-10-CM | POA: Diagnosis not present

## 2021-10-24 DIAGNOSIS — E1140 Type 2 diabetes mellitus with diabetic neuropathy, unspecified: Secondary | ICD-10-CM | POA: Diagnosis not present

## 2021-10-24 DIAGNOSIS — D649 Anemia, unspecified: Secondary | ICD-10-CM | POA: Diagnosis not present

## 2021-10-24 DIAGNOSIS — E11610 Type 2 diabetes mellitus with diabetic neuropathic arthropathy: Secondary | ICD-10-CM | POA: Diagnosis not present

## 2021-10-24 DIAGNOSIS — I10 Essential (primary) hypertension: Secondary | ICD-10-CM | POA: Diagnosis not present

## 2021-10-27 DIAGNOSIS — I10 Essential (primary) hypertension: Secondary | ICD-10-CM | POA: Diagnosis not present

## 2021-10-27 DIAGNOSIS — F419 Anxiety disorder, unspecified: Secondary | ICD-10-CM | POA: Diagnosis not present

## 2021-10-27 DIAGNOSIS — E871 Hypo-osmolality and hyponatremia: Secondary | ICD-10-CM | POA: Diagnosis not present

## 2021-10-27 DIAGNOSIS — L03116 Cellulitis of left lower limb: Secondary | ICD-10-CM | POA: Diagnosis not present

## 2021-10-27 DIAGNOSIS — E11628 Type 2 diabetes mellitus with other skin complications: Secondary | ICD-10-CM | POA: Diagnosis not present

## 2021-10-27 DIAGNOSIS — E11610 Type 2 diabetes mellitus with diabetic neuropathic arthropathy: Secondary | ICD-10-CM | POA: Diagnosis not present

## 2021-10-27 DIAGNOSIS — D649 Anemia, unspecified: Secondary | ICD-10-CM | POA: Diagnosis not present

## 2021-10-27 DIAGNOSIS — E1140 Type 2 diabetes mellitus with diabetic neuropathy, unspecified: Secondary | ICD-10-CM | POA: Diagnosis not present

## 2021-10-27 DIAGNOSIS — F32A Depression, unspecified: Secondary | ICD-10-CM | POA: Diagnosis not present

## 2021-10-27 DIAGNOSIS — F4310 Post-traumatic stress disorder, unspecified: Secondary | ICD-10-CM | POA: Diagnosis not present

## 2021-10-27 DIAGNOSIS — I82622 Acute embolism and thrombosis of deep veins of left upper extremity: Secondary | ICD-10-CM | POA: Diagnosis not present

## 2021-10-27 DIAGNOSIS — E1165 Type 2 diabetes mellitus with hyperglycemia: Secondary | ICD-10-CM | POA: Diagnosis not present

## 2021-10-27 DIAGNOSIS — L02612 Cutaneous abscess of left foot: Secondary | ICD-10-CM | POA: Diagnosis not present

## 2021-10-30 DIAGNOSIS — E871 Hypo-osmolality and hyponatremia: Secondary | ICD-10-CM | POA: Diagnosis not present

## 2021-10-30 DIAGNOSIS — I82622 Acute embolism and thrombosis of deep veins of left upper extremity: Secondary | ICD-10-CM | POA: Diagnosis not present

## 2021-10-30 DIAGNOSIS — E11628 Type 2 diabetes mellitus with other skin complications: Secondary | ICD-10-CM | POA: Diagnosis not present

## 2021-10-30 DIAGNOSIS — L02612 Cutaneous abscess of left foot: Secondary | ICD-10-CM | POA: Diagnosis not present

## 2021-10-30 DIAGNOSIS — F32A Depression, unspecified: Secondary | ICD-10-CM | POA: Diagnosis not present

## 2021-10-30 DIAGNOSIS — D649 Anemia, unspecified: Secondary | ICD-10-CM | POA: Diagnosis not present

## 2021-10-30 DIAGNOSIS — I10 Essential (primary) hypertension: Secondary | ICD-10-CM | POA: Diagnosis not present

## 2021-10-30 DIAGNOSIS — E1165 Type 2 diabetes mellitus with hyperglycemia: Secondary | ICD-10-CM | POA: Diagnosis not present

## 2021-10-30 DIAGNOSIS — F4310 Post-traumatic stress disorder, unspecified: Secondary | ICD-10-CM | POA: Diagnosis not present

## 2021-10-30 DIAGNOSIS — E11610 Type 2 diabetes mellitus with diabetic neuropathic arthropathy: Secondary | ICD-10-CM | POA: Diagnosis not present

## 2021-10-30 DIAGNOSIS — F419 Anxiety disorder, unspecified: Secondary | ICD-10-CM | POA: Diagnosis not present

## 2021-10-30 DIAGNOSIS — E1140 Type 2 diabetes mellitus with diabetic neuropathy, unspecified: Secondary | ICD-10-CM | POA: Diagnosis not present

## 2021-10-30 DIAGNOSIS — L03116 Cellulitis of left lower limb: Secondary | ICD-10-CM | POA: Diagnosis not present

## 2021-11-03 DIAGNOSIS — Z9884 Bariatric surgery status: Secondary | ICD-10-CM | POA: Diagnosis not present

## 2021-11-03 DIAGNOSIS — I10 Essential (primary) hypertension: Secondary | ICD-10-CM | POA: Diagnosis not present

## 2021-11-03 DIAGNOSIS — Z888 Allergy status to other drugs, medicaments and biological substances status: Secondary | ICD-10-CM | POA: Diagnosis not present

## 2021-11-03 DIAGNOSIS — E11621 Type 2 diabetes mellitus with foot ulcer: Secondary | ICD-10-CM | POA: Diagnosis not present

## 2021-11-03 DIAGNOSIS — F4310 Post-traumatic stress disorder, unspecified: Secondary | ICD-10-CM | POA: Diagnosis not present

## 2021-11-03 DIAGNOSIS — L97422 Non-pressure chronic ulcer of left heel and midfoot with fat layer exposed: Secondary | ICD-10-CM | POA: Diagnosis not present

## 2021-11-03 DIAGNOSIS — E1142 Type 2 diabetes mellitus with diabetic polyneuropathy: Secondary | ICD-10-CM | POA: Diagnosis not present

## 2021-11-03 DIAGNOSIS — L97522 Non-pressure chronic ulcer of other part of left foot with fat layer exposed: Secondary | ICD-10-CM | POA: Diagnosis not present

## 2021-11-03 DIAGNOSIS — I82722 Chronic embolism and thrombosis of deep veins of left upper extremity: Secondary | ICD-10-CM | POA: Diagnosis not present

## 2021-11-03 DIAGNOSIS — Z881 Allergy status to other antibiotic agents status: Secondary | ICD-10-CM | POA: Diagnosis not present

## 2021-11-03 DIAGNOSIS — E1140 Type 2 diabetes mellitus with diabetic neuropathy, unspecified: Secondary | ICD-10-CM | POA: Diagnosis not present

## 2021-11-03 DIAGNOSIS — Z794 Long term (current) use of insulin: Secondary | ICD-10-CM | POA: Diagnosis not present

## 2021-11-03 DIAGNOSIS — F32A Depression, unspecified: Secondary | ICD-10-CM | POA: Diagnosis not present

## 2021-11-03 DIAGNOSIS — F419 Anxiety disorder, unspecified: Secondary | ICD-10-CM | POA: Diagnosis not present

## 2021-11-03 DIAGNOSIS — Z6836 Body mass index (BMI) 36.0-36.9, adult: Secondary | ICD-10-CM | POA: Diagnosis not present

## 2021-11-03 DIAGNOSIS — R269 Unspecified abnormalities of gait and mobility: Secondary | ICD-10-CM | POA: Diagnosis not present

## 2021-11-03 DIAGNOSIS — E11610 Type 2 diabetes mellitus with diabetic neuropathic arthropathy: Secondary | ICD-10-CM | POA: Diagnosis not present

## 2021-11-06 DIAGNOSIS — Z4889 Encounter for other specified surgical aftercare: Secondary | ICD-10-CM | POA: Diagnosis not present

## 2021-11-14 DIAGNOSIS — E11610 Type 2 diabetes mellitus with diabetic neuropathic arthropathy: Secondary | ICD-10-CM | POA: Diagnosis not present

## 2021-11-14 DIAGNOSIS — Z5181 Encounter for therapeutic drug level monitoring: Secondary | ICD-10-CM | POA: Diagnosis not present

## 2021-11-14 DIAGNOSIS — M25511 Pain in right shoulder: Secondary | ICD-10-CM | POA: Diagnosis not present

## 2021-11-14 DIAGNOSIS — M549 Dorsalgia, unspecified: Secondary | ICD-10-CM | POA: Diagnosis not present

## 2021-11-14 DIAGNOSIS — Z79899 Other long term (current) drug therapy: Secondary | ICD-10-CM | POA: Diagnosis not present

## 2021-11-14 DIAGNOSIS — M79672 Pain in left foot: Secondary | ICD-10-CM | POA: Diagnosis not present

## 2021-11-24 ENCOUNTER — Other Ambulatory Visit: Payer: Self-pay

## 2021-11-24 DIAGNOSIS — E1142 Type 2 diabetes mellitus with diabetic polyneuropathy: Secondary | ICD-10-CM

## 2021-11-24 DIAGNOSIS — Z4889 Encounter for other specified surgical aftercare: Secondary | ICD-10-CM | POA: Diagnosis not present

## 2021-11-24 MED ORDER — DEXCOM G6 TRANSMITTER MISC: 1.0000 | Freq: Once | 1 refills | Status: DC

## 2021-12-05 DIAGNOSIS — I10 Essential (primary) hypertension: Secondary | ICD-10-CM | POA: Diagnosis not present

## 2021-12-05 DIAGNOSIS — L97422 Non-pressure chronic ulcer of left heel and midfoot with fat layer exposed: Secondary | ICD-10-CM | POA: Diagnosis not present

## 2021-12-05 DIAGNOSIS — Z4889 Encounter for other specified surgical aftercare: Secondary | ICD-10-CM | POA: Diagnosis not present

## 2021-12-05 DIAGNOSIS — E11621 Type 2 diabetes mellitus with foot ulcer: Secondary | ICD-10-CM | POA: Diagnosis not present

## 2021-12-26 ENCOUNTER — Other Ambulatory Visit: Payer: Self-pay | Admitting: Endocrinology

## 2021-12-27 ENCOUNTER — Other Ambulatory Visit (HOSPITAL_COMMUNITY): Payer: Self-pay

## 2022-01-04 ENCOUNTER — Ambulatory Visit: Payer: BC Managed Care – PPO | Admitting: Endocrinology

## 2022-01-11 DIAGNOSIS — M14679 Charcot's joint, unspecified ankle and foot: Secondary | ICD-10-CM | POA: Diagnosis not present

## 2022-01-11 DIAGNOSIS — M869 Osteomyelitis, unspecified: Secondary | ICD-10-CM | POA: Diagnosis not present

## 2022-01-11 DIAGNOSIS — F329 Major depressive disorder, single episode, unspecified: Secondary | ICD-10-CM | POA: Diagnosis not present

## 2022-01-11 DIAGNOSIS — E1140 Type 2 diabetes mellitus with diabetic neuropathy, unspecified: Secondary | ICD-10-CM | POA: Diagnosis not present

## 2022-01-12 DIAGNOSIS — I10 Essential (primary) hypertension: Secondary | ICD-10-CM | POA: Diagnosis not present

## 2022-01-12 DIAGNOSIS — F4310 Post-traumatic stress disorder, unspecified: Secondary | ICD-10-CM | POA: Diagnosis not present

## 2022-01-12 DIAGNOSIS — Z7901 Long term (current) use of anticoagulants: Secondary | ICD-10-CM | POA: Diagnosis not present

## 2022-01-12 DIAGNOSIS — E11610 Type 2 diabetes mellitus with diabetic neuropathic arthropathy: Secondary | ICD-10-CM | POA: Diagnosis not present

## 2022-01-12 DIAGNOSIS — E669 Obesity, unspecified: Secondary | ICD-10-CM | POA: Diagnosis not present

## 2022-01-12 DIAGNOSIS — F32A Depression, unspecified: Secondary | ICD-10-CM | POA: Diagnosis not present

## 2022-01-12 DIAGNOSIS — I8290 Acute embolism and thrombosis of unspecified vein: Secondary | ICD-10-CM | POA: Diagnosis not present

## 2022-01-12 DIAGNOSIS — Z9889 Other specified postprocedural states: Secondary | ICD-10-CM | POA: Diagnosis not present

## 2022-01-12 DIAGNOSIS — F419 Anxiety disorder, unspecified: Secondary | ICD-10-CM | POA: Diagnosis not present

## 2022-01-12 DIAGNOSIS — Z79899 Other long term (current) drug therapy: Secondary | ICD-10-CM | POA: Diagnosis not present

## 2022-01-12 DIAGNOSIS — Z9884 Bariatric surgery status: Secondary | ICD-10-CM | POA: Diagnosis not present

## 2022-01-12 DIAGNOSIS — Z8616 Personal history of COVID-19: Secondary | ICD-10-CM | POA: Diagnosis not present

## 2022-01-12 DIAGNOSIS — E119 Type 2 diabetes mellitus without complications: Secondary | ICD-10-CM | POA: Diagnosis not present

## 2022-01-12 DIAGNOSIS — Z4789 Encounter for other orthopedic aftercare: Secondary | ICD-10-CM | POA: Diagnosis not present

## 2022-01-12 DIAGNOSIS — L97422 Non-pressure chronic ulcer of left heel and midfoot with fat layer exposed: Secondary | ICD-10-CM | POA: Diagnosis not present

## 2022-01-12 DIAGNOSIS — I82602 Acute embolism and thrombosis of unspecified veins of left upper extremity: Secondary | ICD-10-CM | POA: Diagnosis not present

## 2022-01-12 DIAGNOSIS — I82722 Chronic embolism and thrombosis of deep veins of left upper extremity: Secondary | ICD-10-CM | POA: Diagnosis not present

## 2022-01-12 DIAGNOSIS — Z6836 Body mass index (BMI) 36.0-36.9, adult: Secondary | ICD-10-CM | POA: Diagnosis not present

## 2022-01-12 DIAGNOSIS — M86172 Other acute osteomyelitis, left ankle and foot: Secondary | ICD-10-CM | POA: Diagnosis not present

## 2022-01-12 DIAGNOSIS — E11621 Type 2 diabetes mellitus with foot ulcer: Secondary | ICD-10-CM | POA: Diagnosis not present

## 2022-01-13 DIAGNOSIS — E119 Type 2 diabetes mellitus without complications: Secondary | ICD-10-CM | POA: Diagnosis not present

## 2022-01-13 DIAGNOSIS — Z6836 Body mass index (BMI) 36.0-36.9, adult: Secondary | ICD-10-CM | POA: Diagnosis not present

## 2022-01-13 DIAGNOSIS — F32A Depression, unspecified: Secondary | ICD-10-CM | POA: Diagnosis not present

## 2022-01-13 DIAGNOSIS — E669 Obesity, unspecified: Secondary | ICD-10-CM | POA: Diagnosis not present

## 2022-01-13 DIAGNOSIS — M86172 Other acute osteomyelitis, left ankle and foot: Secondary | ICD-10-CM | POA: Diagnosis not present

## 2022-01-13 DIAGNOSIS — E11621 Type 2 diabetes mellitus with foot ulcer: Secondary | ICD-10-CM | POA: Diagnosis not present

## 2022-01-13 DIAGNOSIS — Z8616 Personal history of COVID-19: Secondary | ICD-10-CM | POA: Diagnosis not present

## 2022-01-13 DIAGNOSIS — I8290 Acute embolism and thrombosis of unspecified vein: Secondary | ICD-10-CM | POA: Diagnosis not present

## 2022-01-13 DIAGNOSIS — E11610 Type 2 diabetes mellitus with diabetic neuropathic arthropathy: Secondary | ICD-10-CM | POA: Diagnosis not present

## 2022-01-13 DIAGNOSIS — I82602 Acute embolism and thrombosis of unspecified veins of left upper extremity: Secondary | ICD-10-CM | POA: Diagnosis not present

## 2022-01-13 DIAGNOSIS — Z9889 Other specified postprocedural states: Secondary | ICD-10-CM | POA: Diagnosis not present

## 2022-01-13 DIAGNOSIS — Z79899 Other long term (current) drug therapy: Secondary | ICD-10-CM | POA: Diagnosis not present

## 2022-01-13 DIAGNOSIS — F419 Anxiety disorder, unspecified: Secondary | ICD-10-CM | POA: Diagnosis not present

## 2022-01-13 DIAGNOSIS — F4310 Post-traumatic stress disorder, unspecified: Secondary | ICD-10-CM | POA: Diagnosis not present

## 2022-01-13 DIAGNOSIS — I82722 Chronic embolism and thrombosis of deep veins of left upper extremity: Secondary | ICD-10-CM | POA: Diagnosis not present

## 2022-01-13 DIAGNOSIS — L97422 Non-pressure chronic ulcer of left heel and midfoot with fat layer exposed: Secondary | ICD-10-CM | POA: Diagnosis not present

## 2022-01-13 DIAGNOSIS — Z7901 Long term (current) use of anticoagulants: Secondary | ICD-10-CM | POA: Diagnosis not present

## 2022-01-13 DIAGNOSIS — Z9884 Bariatric surgery status: Secondary | ICD-10-CM | POA: Diagnosis not present

## 2022-01-13 DIAGNOSIS — I10 Essential (primary) hypertension: Secondary | ICD-10-CM | POA: Diagnosis not present

## 2022-02-02 DIAGNOSIS — Z4889 Encounter for other specified surgical aftercare: Secondary | ICD-10-CM | POA: Diagnosis not present

## 2022-02-07 DIAGNOSIS — M25511 Pain in right shoulder: Secondary | ICD-10-CM | POA: Diagnosis not present

## 2022-02-07 DIAGNOSIS — E11610 Type 2 diabetes mellitus with diabetic neuropathic arthropathy: Secondary | ICD-10-CM | POA: Diagnosis not present

## 2022-02-07 DIAGNOSIS — T8131XA Disruption of external operation (surgical) wound, not elsewhere classified, initial encounter: Secondary | ICD-10-CM | POA: Diagnosis not present

## 2022-02-07 DIAGNOSIS — L97422 Non-pressure chronic ulcer of left heel and midfoot with fat layer exposed: Secondary | ICD-10-CM | POA: Diagnosis not present

## 2022-02-07 DIAGNOSIS — E11621 Type 2 diabetes mellitus with foot ulcer: Secondary | ICD-10-CM | POA: Diagnosis not present

## 2022-02-07 DIAGNOSIS — M79672 Pain in left foot: Secondary | ICD-10-CM | POA: Diagnosis not present

## 2022-02-07 DIAGNOSIS — M549 Dorsalgia, unspecified: Secondary | ICD-10-CM | POA: Diagnosis not present

## 2022-02-07 DIAGNOSIS — I10 Essential (primary) hypertension: Secondary | ICD-10-CM | POA: Diagnosis not present

## 2022-02-09 DIAGNOSIS — E113593 Type 2 diabetes mellitus with proliferative diabetic retinopathy without macular edema, bilateral: Secondary | ICD-10-CM | POA: Diagnosis not present

## 2022-02-09 DIAGNOSIS — H524 Presbyopia: Secondary | ICD-10-CM | POA: Diagnosis not present

## 2022-02-12 DIAGNOSIS — F329 Major depressive disorder, single episode, unspecified: Secondary | ICD-10-CM | POA: Diagnosis not present

## 2022-02-12 DIAGNOSIS — D509 Iron deficiency anemia, unspecified: Secondary | ICD-10-CM | POA: Diagnosis not present

## 2022-02-12 DIAGNOSIS — Z794 Long term (current) use of insulin: Secondary | ICD-10-CM | POA: Diagnosis not present

## 2022-02-13 DIAGNOSIS — L97522 Non-pressure chronic ulcer of other part of left foot with fat layer exposed: Secondary | ICD-10-CM | POA: Diagnosis not present

## 2022-02-13 DIAGNOSIS — E11621 Type 2 diabetes mellitus with foot ulcer: Secondary | ICD-10-CM | POA: Diagnosis not present

## 2022-02-20 ENCOUNTER — Encounter: Payer: Self-pay | Admitting: Gastroenterology

## 2022-02-21 DIAGNOSIS — L97522 Non-pressure chronic ulcer of other part of left foot with fat layer exposed: Secondary | ICD-10-CM | POA: Diagnosis not present

## 2022-02-21 DIAGNOSIS — T8131XA Disruption of external operation (surgical) wound, not elsewhere classified, initial encounter: Secondary | ICD-10-CM | POA: Diagnosis not present

## 2022-02-21 DIAGNOSIS — E11621 Type 2 diabetes mellitus with foot ulcer: Secondary | ICD-10-CM | POA: Diagnosis not present

## 2022-02-22 ENCOUNTER — Encounter: Payer: Self-pay | Admitting: Internal Medicine

## 2022-02-22 ENCOUNTER — Ambulatory Visit (INDEPENDENT_AMBULATORY_CARE_PROVIDER_SITE_OTHER): Payer: BC Managed Care – PPO | Admitting: Internal Medicine

## 2022-02-22 ENCOUNTER — Other Ambulatory Visit: Payer: Self-pay | Admitting: Internal Medicine

## 2022-02-22 VITALS — BP 118/64 | HR 86 | Ht 68.0 in

## 2022-02-22 DIAGNOSIS — E1142 Type 2 diabetes mellitus with diabetic polyneuropathy: Secondary | ICD-10-CM | POA: Diagnosis not present

## 2022-02-22 DIAGNOSIS — E1165 Type 2 diabetes mellitus with hyperglycemia: Secondary | ICD-10-CM

## 2022-02-22 DIAGNOSIS — Z794 Long term (current) use of insulin: Secondary | ICD-10-CM | POA: Diagnosis not present

## 2022-02-22 DIAGNOSIS — E11610 Type 2 diabetes mellitus with diabetic neuropathic arthropathy: Secondary | ICD-10-CM | POA: Diagnosis not present

## 2022-02-22 LAB — POCT GLYCOSYLATED HEMOGLOBIN (HGB A1C): Hemoglobin A1C: 8.4 % — AB (ref 4.0–5.6)

## 2022-02-22 MED ORDER — DEXCOM G6 TRANSMITTER MISC
1.0000 | Freq: Once | 2 refills | Status: DC
Start: 2022-02-22 — End: 2023-06-13

## 2022-02-22 MED ORDER — SEMAGLUTIDE (2 MG/DOSE) 8 MG/3ML ~~LOC~~ SOPN: 2.0000 mg | PEN_INJECTOR | SUBCUTANEOUS | 2 refills | Status: DC

## 2022-02-22 MED ORDER — OMNIPOD 5 DEXG7G6 INTRO GEN 5 KIT: 1.0000 | PACK | 0 refills | Status: DC

## 2022-02-22 MED ORDER — OMNIPOD 5 DEXG7G6 PODS GEN 5 MISC
1.0000 | 3 refills | Status: DC
Start: 2022-02-22 — End: 2022-11-14

## 2022-02-22 MED ORDER — DEXCOM G6 SENSOR MISC: 1.0000 | 2 refills | Status: DC

## 2022-02-22 MED ORDER — INSULIN PEN NEEDLE 31G X 5 MM MISC
1.0000 | Freq: Three times a day (TID) | 2 refills | Status: DC
Start: 2022-02-22 — End: 2023-06-13

## 2022-02-22 MED ORDER — NOVOLOG FLEXPEN 100 UNIT/ML ~~LOC~~ SOPN
PEN_INJECTOR | SUBCUTANEOUS | 4 refills | Status: DC
Start: 2022-02-22 — End: 2022-02-22

## 2022-02-22 NOTE — Patient Instructions (Signed)
Continue Ozempic 2 mg weekly  Increase Novolog 14 units with each meal  Novolog correctional insulin: ADD extra units on insulin to your meal-time Novolog dose if your blood sugars are higher than 160. Use the scale below to help guide you:   Blood sugar before meal Number of units to inject  Less than 160 0 unit  161 -  190 1 units  191 -  220 2 units  221 -  250 3 units  251 -  280 4 units  281 -  310 5 units  311 -  340 6 units  341 -  370 7 units  371 -  400 8 units     Check out the Omnipod 5  HOW TO TREAT LOW BLOOD SUGARS (Blood sugar LESS THAN 70 MG/DL) Please follow the RULE OF 15 for the treatment of hypoglycemia treatment (when your (blood sugars are less than 70 mg/dL)   STEP 1: Take 15 grams of carbohydrates when your blood sugar is low, which includes:  3-4 GLUCOSE TABS  OR 3-4 OZ OF JUICE OR REGULAR SODA OR ONE TUBE OF GLUCOSE GEL    STEP 2: RECHECK blood sugar in 15 MINUTES STEP 3: If your blood sugar is still low at the 15 minute recheck --> then, go back to STEP 1 and treat AGAIN with another 15 grams of carbohydrates.
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
## 2022-02-23 DIAGNOSIS — Z794 Long term (current) use of insulin: Secondary | ICD-10-CM | POA: Insufficient documentation

## 2022-02-26 ENCOUNTER — Ambulatory Visit (INDEPENDENT_AMBULATORY_CARE_PROVIDER_SITE_OTHER): Payer: BC Managed Care – PPO | Admitting: Ophthalmology

## 2022-02-26 ENCOUNTER — Encounter (INDEPENDENT_AMBULATORY_CARE_PROVIDER_SITE_OTHER): Payer: Self-pay | Admitting: Ophthalmology

## 2022-02-26 DIAGNOSIS — E113552 Type 2 diabetes mellitus with stable proliferative diabetic retinopathy, left eye: Secondary | ICD-10-CM

## 2022-02-26 DIAGNOSIS — H35372 Puckering of macula, left eye: Secondary | ICD-10-CM

## 2022-02-26 DIAGNOSIS — H43391 Other vitreous opacities, right eye: Secondary | ICD-10-CM

## 2022-02-26 DIAGNOSIS — H4311 Vitreous hemorrhage, right eye: Secondary | ICD-10-CM

## 2022-02-26 DIAGNOSIS — E113511 Type 2 diabetes mellitus with proliferative diabetic retinopathy with macular edema, right eye: Secondary | ICD-10-CM | POA: Diagnosis not present

## 2022-02-26 NOTE — Assessment & Plan Note (Signed)
Quiescent PDR OS

## 2022-02-26 NOTE — Assessment & Plan Note (Signed)
Quiescent PDR OU

## 2022-02-26 NOTE — Assessment & Plan Note (Signed)
Patient describes small dark well-circumscribed circular floater, with central lucency.  This does sound like a small silicone bubble that may have been left in place as a consequence of any of the prior surgeries and/or prior injections in the right eye.  I reassured the patient this is not pathologic

## 2022-02-26 NOTE — Assessment & Plan Note (Signed)
Minor none distorting of the fovea
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
## 2022-02-28 DIAGNOSIS — L97522 Non-pressure chronic ulcer of other part of left foot with fat layer exposed: Secondary | ICD-10-CM | POA: Diagnosis not present

## 2022-02-28 DIAGNOSIS — E11621 Type 2 diabetes mellitus with foot ulcer: Secondary | ICD-10-CM | POA: Diagnosis not present

## 2022-03-05 ENCOUNTER — Encounter: Payer: Self-pay | Admitting: Internal Medicine

## 2022-03-06 ENCOUNTER — Other Ambulatory Visit: Payer: Self-pay

## 2022-03-06 MED ORDER — INSULIN ASPART 100 UNIT/ML IJ SOLN: INTRAMUSCULAR | 11 refills | Status: DC

## 2022-03-07 DIAGNOSIS — E11621 Type 2 diabetes mellitus with foot ulcer: Secondary | ICD-10-CM | POA: Diagnosis not present

## 2022-03-07 DIAGNOSIS — L97522 Non-pressure chronic ulcer of other part of left foot with fat layer exposed: Secondary | ICD-10-CM | POA: Diagnosis not present

## 2022-03-07 DIAGNOSIS — I10 Essential (primary) hypertension: Secondary | ICD-10-CM | POA: Diagnosis not present

## 2022-03-07 DIAGNOSIS — E1143 Type 2 diabetes mellitus with diabetic autonomic (poly)neuropathy: Secondary | ICD-10-CM | POA: Diagnosis not present

## 2022-03-08 ENCOUNTER — Telehealth: Payer: Self-pay | Admitting: Pharmacy Technician

## 2022-03-08 ENCOUNTER — Other Ambulatory Visit (HOSPITAL_COMMUNITY): Payer: Self-pay

## 2022-03-08 NOTE — Telephone Encounter (Signed)
Patient Advocate Encounter   Received notification from office staff that prior authorization for Omnipod is required by his/her insurance [employer] commercial.   PA submitted on 03/08/22 Key [id number] Status is pending    [facility] will continue to follow:  Patient Advocate Fax:  [phone]

## 2022-03-20 ENCOUNTER — Telehealth: Payer: Self-pay

## 2022-03-20 ENCOUNTER — Other Ambulatory Visit (HOSPITAL_COMMUNITY): Payer: Self-pay

## 2022-03-20 NOTE — Telephone Encounter (Signed)
Patient Advocate Encounter  Received notification from [facility] that the request for prior authorization for Omnipod has been denied due to not being compliant with blood glucose monitoring. Can we get a d/l from her CGM to show that she is compliant? We could then submit an appeal.

## 2022-03-20 NOTE — Telephone Encounter (Signed)
Patient Advocate Encounter   Received notification from [facility] that prior authorization is required for Omnipod 5 G6 Pod.  Key [id number]  Submitted: 03/20/2022 Status is pending  Clista Bernhardt, [place] Patient Advocate Specialist [place] Patient Advocate Team Phone: [phone]   Fax: [phone]

## 2022-03-21 NOTE — Telephone Encounter (Signed)
LMTCB and mychart message sent

## 2022-03-23 ENCOUNTER — Other Ambulatory Visit (HOSPITAL_COMMUNITY): Payer: Self-pay

## 2022-03-26 ENCOUNTER — Other Ambulatory Visit (HOSPITAL_COMMUNITY): Payer: Self-pay

## 2022-03-26 ENCOUNTER — Encounter (INDEPENDENT_AMBULATORY_CARE_PROVIDER_SITE_OTHER): Payer: BC Managed Care – PPO | Admitting: Ophthalmology

## 2022-03-26 NOTE — Telephone Encounter (Signed)
Patient Advocate Encounter   Received notification from CoverMyMeds that prior authorization for Omnipod has been cancelled due to duplicate request. Key [id number] was denied as the form indicated that the patient is non-compliant with glucose testing. This is not accurate.  I have reached out to the appeal team to have this reviewed.  Clista Bernhardt, CPhT Rx Patient Advocate Specialist Phone: [phone]

## 2022-03-30 NOTE — Telephone Encounter (Signed)
Sent patient mychart message so she can bring by office.

## 2022-04-04 DIAGNOSIS — L97522 Non-pressure chronic ulcer of other part of left foot with fat layer exposed: Secondary | ICD-10-CM | POA: Diagnosis not present

## 2022-04-04 DIAGNOSIS — E11621 Type 2 diabetes mellitus with foot ulcer: Secondary | ICD-10-CM | POA: Diagnosis not present

## 2022-04-18 DIAGNOSIS — L97522 Non-pressure chronic ulcer of other part of left foot with fat layer exposed: Secondary | ICD-10-CM | POA: Diagnosis not present

## 2022-04-18 DIAGNOSIS — E1143 Type 2 diabetes mellitus with diabetic autonomic (poly)neuropathy: Secondary | ICD-10-CM | POA: Diagnosis not present

## 2022-04-18 DIAGNOSIS — T8131XA Disruption of external operation (surgical) wound, not elsewhere classified, initial encounter: Secondary | ICD-10-CM | POA: Diagnosis not present

## 2022-04-18 DIAGNOSIS — E11610 Type 2 diabetes mellitus with diabetic neuropathic arthropathy: Secondary | ICD-10-CM | POA: Diagnosis not present

## 2022-04-18 DIAGNOSIS — E11621 Type 2 diabetes mellitus with foot ulcer: Secondary | ICD-10-CM | POA: Diagnosis not present

## 2022-04-18 DIAGNOSIS — I10 Essential (primary) hypertension: Secondary | ICD-10-CM | POA: Diagnosis not present

## 2022-04-23 NOTE — Telephone Encounter (Signed)
I have attempted to contact patient several times with no success. VM is full. Mychart was sent to patient but haven't received anything yet.

## 2022-04-25 ENCOUNTER — Other Ambulatory Visit (HOSPITAL_COMMUNITY): Payer: Self-pay

## 2022-05-01 DIAGNOSIS — L97522 Non-pressure chronic ulcer of other part of left foot with fat layer exposed: Secondary | ICD-10-CM | POA: Diagnosis not present

## 2022-05-01 DIAGNOSIS — E11621 Type 2 diabetes mellitus with foot ulcer: Secondary | ICD-10-CM | POA: Diagnosis not present

## 2022-05-01 DIAGNOSIS — I10 Essential (primary) hypertension: Secondary | ICD-10-CM | POA: Diagnosis not present

## 2022-05-01 DIAGNOSIS — E11610 Type 2 diabetes mellitus with diabetic neuropathic arthropathy: Secondary | ICD-10-CM | POA: Diagnosis not present

## 2022-05-02 DIAGNOSIS — M25511 Pain in right shoulder: Secondary | ICD-10-CM | POA: Diagnosis not present

## 2022-05-02 DIAGNOSIS — E11610 Type 2 diabetes mellitus with diabetic neuropathic arthropathy: Secondary | ICD-10-CM | POA: Diagnosis not present

## 2022-05-02 DIAGNOSIS — M79672 Pain in left foot: Secondary | ICD-10-CM | POA: Diagnosis not present

## 2022-05-02 DIAGNOSIS — Z79899 Other long term (current) drug therapy: Secondary | ICD-10-CM | POA: Diagnosis not present

## 2022-05-02 DIAGNOSIS — M549 Dorsalgia, unspecified: Secondary | ICD-10-CM | POA: Diagnosis not present

## 2022-05-02 DIAGNOSIS — Z5181 Encounter for therapeutic drug level monitoring: Secondary | ICD-10-CM | POA: Diagnosis not present

## 2022-05-03 ENCOUNTER — Telehealth: Payer: Self-pay

## 2022-05-03 NOTE — Telephone Encounter (Addendum)
Patient Advocate Encounter   Received notification from [facility] that prior authorization is required for Omnipod 5 G6 Pods. PA submitted and APPROVED on 05/03/2022.  Key [id number]  Effective: 05/03/2022 - 05/02/2023  Clista Bernhardt, CPhT Rx Patient Advocate Phone: [phone]

## 2022-05-09 DIAGNOSIS — L97522 Non-pressure chronic ulcer of other part of left foot with fat layer exposed: Secondary | ICD-10-CM | POA: Diagnosis not present

## 2022-05-09 DIAGNOSIS — E11621 Type 2 diabetes mellitus with foot ulcer: Secondary | ICD-10-CM | POA: Diagnosis not present

## 2022-05-16 ENCOUNTER — Other Ambulatory Visit (HOSPITAL_COMMUNITY): Payer: Self-pay

## 2022-05-16 DIAGNOSIS — E11621 Type 2 diabetes mellitus with foot ulcer: Secondary | ICD-10-CM | POA: Diagnosis not present

## 2022-05-16 DIAGNOSIS — L97522 Non-pressure chronic ulcer of other part of left foot with fat layer exposed: Secondary | ICD-10-CM | POA: Diagnosis not present

## 2022-05-16 DIAGNOSIS — E1143 Type 2 diabetes mellitus with diabetic autonomic (poly)neuropathy: Secondary | ICD-10-CM | POA: Diagnosis not present

## 2022-05-16 DIAGNOSIS — T8131XA Disruption of external operation (surgical) wound, not elsewhere classified, initial encounter: Secondary | ICD-10-CM | POA: Diagnosis not present

## 2022-05-16 DIAGNOSIS — E11610 Type 2 diabetes mellitus with diabetic neuropathic arthropathy: Secondary | ICD-10-CM | POA: Diagnosis not present

## 2022-05-16 DIAGNOSIS — I10 Essential (primary) hypertension: Secondary | ICD-10-CM | POA: Diagnosis not present

## 2022-05-29 DIAGNOSIS — E11610 Type 2 diabetes mellitus with diabetic neuropathic arthropathy: Secondary | ICD-10-CM | POA: Diagnosis not present

## 2022-05-29 DIAGNOSIS — T8131XA Disruption of external operation (surgical) wound, not elsewhere classified, initial encounter: Secondary | ICD-10-CM | POA: Diagnosis not present

## 2022-05-29 DIAGNOSIS — I10 Essential (primary) hypertension: Secondary | ICD-10-CM | POA: Diagnosis not present

## 2022-05-29 DIAGNOSIS — E11621 Type 2 diabetes mellitus with foot ulcer: Secondary | ICD-10-CM | POA: Diagnosis not present

## 2022-05-29 DIAGNOSIS — E1143 Type 2 diabetes mellitus with diabetic autonomic (poly)neuropathy: Secondary | ICD-10-CM | POA: Diagnosis not present

## 2022-05-29 DIAGNOSIS — L97522 Non-pressure chronic ulcer of other part of left foot with fat layer exposed: Secondary | ICD-10-CM | POA: Diagnosis not present

## 2022-06-19 DIAGNOSIS — L97522 Non-pressure chronic ulcer of other part of left foot with fat layer exposed: Secondary | ICD-10-CM | POA: Diagnosis not present

## 2022-06-19 DIAGNOSIS — E11621 Type 2 diabetes mellitus with foot ulcer: Secondary | ICD-10-CM | POA: Diagnosis not present

## 2022-06-19 DIAGNOSIS — I10 Essential (primary) hypertension: Secondary | ICD-10-CM | POA: Diagnosis not present

## 2022-06-19 DIAGNOSIS — E11610 Type 2 diabetes mellitus with diabetic neuropathic arthropathy: Secondary | ICD-10-CM | POA: Diagnosis not present

## 2022-06-25 DIAGNOSIS — L97522 Non-pressure chronic ulcer of other part of left foot with fat layer exposed: Secondary | ICD-10-CM | POA: Diagnosis not present

## 2022-06-25 DIAGNOSIS — E11621 Type 2 diabetes mellitus with foot ulcer: Secondary | ICD-10-CM | POA: Diagnosis not present

## 2022-06-29 ENCOUNTER — Ambulatory Visit: Payer: BC Managed Care – PPO | Admitting: Internal Medicine

## 2022-07-11 DIAGNOSIS — T8131XA Disruption of external operation (surgical) wound, not elsewhere classified, initial encounter: Secondary | ICD-10-CM | POA: Diagnosis not present

## 2022-07-11 DIAGNOSIS — S90414A Abrasion, right lesser toe(s), initial encounter: Secondary | ICD-10-CM | POA: Diagnosis not present

## 2022-07-11 DIAGNOSIS — L03031 Cellulitis of right toe: Secondary | ICD-10-CM | POA: Diagnosis not present

## 2022-07-11 DIAGNOSIS — I10 Essential (primary) hypertension: Secondary | ICD-10-CM | POA: Diagnosis not present

## 2022-07-11 DIAGNOSIS — E11610 Type 2 diabetes mellitus with diabetic neuropathic arthropathy: Secondary | ICD-10-CM | POA: Diagnosis not present

## 2022-07-11 DIAGNOSIS — E1143 Type 2 diabetes mellitus with diabetic autonomic (poly)neuropathy: Secondary | ICD-10-CM | POA: Diagnosis not present

## 2022-07-11 DIAGNOSIS — E11621 Type 2 diabetes mellitus with foot ulcer: Secondary | ICD-10-CM | POA: Diagnosis not present

## 2022-07-12 ENCOUNTER — Encounter: Payer: Self-pay | Admitting: Internal Medicine

## 2022-07-12 ENCOUNTER — Ambulatory Visit (INDEPENDENT_AMBULATORY_CARE_PROVIDER_SITE_OTHER): Payer: BC Managed Care – PPO | Admitting: Internal Medicine

## 2022-07-12 VITALS — BP 124/78 | HR 88 | Ht 68.0 in

## 2022-07-12 DIAGNOSIS — E1165 Type 2 diabetes mellitus with hyperglycemia: Secondary | ICD-10-CM | POA: Diagnosis not present

## 2022-07-12 DIAGNOSIS — Z794 Long term (current) use of insulin: Secondary | ICD-10-CM | POA: Diagnosis not present

## 2022-07-12 DIAGNOSIS — E1142 Type 2 diabetes mellitus with diabetic polyneuropathy: Secondary | ICD-10-CM | POA: Diagnosis not present

## 2022-07-12 DIAGNOSIS — E113599 Type 2 diabetes mellitus with proliferative diabetic retinopathy without macular edema, unspecified eye: Secondary | ICD-10-CM

## 2022-07-12 DIAGNOSIS — E11610 Type 2 diabetes mellitus with diabetic neuropathic arthropathy: Secondary | ICD-10-CM

## 2022-07-12 LAB — BASIC METABOLIC PANEL
BUN: 21 mg/dL (ref 6–23)
CO2: 28 mEq/L (ref 19–32)
Calcium: 9.1 mg/dL (ref 8.4–10.5)
Chloride: 102 mEq/L (ref 96–112)
Creatinine, Ser: 0.91 mg/dL (ref 0.40–1.20)
GFR: 70.58 mL/min (ref >60.00)
Glucose, Bld: 106 mg/dL — ABNORMAL HIGH (ref 70–99)
Potassium: 4.7 mEq/L (ref 3.5–5.1)
Sodium: 136 mEq/L (ref 135–145)

## 2022-07-12 LAB — POCT GLYCOSYLATED HEMOGLOBIN (HGB A1C): Hemoglobin A1C: 7.1 % — AB (ref 4.0–5.6)

## 2022-07-12 LAB — MICROALBUMIN / CREATININE URINE RATIO
Creatinine,U: 41.3 mg/dL
Microalb Creat Ratio: 1.7 mg/g (ref 0.0–30.0)
Microalb, Ur: <0.7 mg/dL (ref 0.0–1.9)

## 2022-07-12 MED ORDER — ACARBOSE 50 MG PO TABS
50.0000 mg | ORAL_TABLET | Freq: Three times a day (TID) | ORAL | 0 refills | Status: DC
Start: 2022-07-12 — End: 2022-11-14

## 2022-07-12 MED ORDER — SEMAGLUTIDE (2 MG/DOSE) 8 MG/3ML ~~LOC~~ SOPN: 2.0000 mg | PEN_INJECTOR | SUBCUTANEOUS | 2 refills | Status: DC
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
## 2022-07-12 NOTE — Patient Instructions (Addendum)
Take Acarbose 50 mg , 1 tablet with each meal ( DO NOT take Novolog 14 units while on Acarbose- but may use the scale of your sugar is over 160)  Continue Ozempic 2 mg weekly  Novolog correctional insulin: ADD extra units on insulin to your meal-time Novolog dose if your blood sugars are higher than 160. Use the scale below to help guide you:   Blood sugar before meal Number of units to inject  Less than 160 0 unit  161 -  190 1 units  191 -  220 2 units  221 -  250 3 units  251 -  280 4 units  281 -  310 5 units  311 -  340 6 units  341 -  370 7 units  371 -  400 8 units      HOW TO TREAT LOW BLOOD SUGARS (Blood sugar LESS THAN 70 MG/DL) Please follow the RULE OF 15 for the treatment of hypoglycemia treatment (when your (blood sugars are less than 70 mg/dL)   STEP 1: Take 15 grams of carbohydrates when your blood sugar is low, which includes:  3-4 GLUCOSE TABS  OR 3-4 OZ OF JUICE OR REGULAR SODA OR ONE TUBE OF GLUCOSE GEL    STEP 2: RECHECK blood sugar in 15 MINUTES STEP 3: If your blood sugar is still low at the 15 minute recheck --> then, go back to STEP 1 and treat AGAIN with another 15 grams of carbohydrates.

## 2022-07-13 ENCOUNTER — Encounter: Payer: Self-pay | Admitting: Internal Medicine

## 2022-07-13 ENCOUNTER — Other Ambulatory Visit: Payer: Self-pay | Admitting: Internal Medicine

## 2022-07-13 DIAGNOSIS — E113599 Type 2 diabetes mellitus with proliferative diabetic retinopathy without macular edema, unspecified eye: Secondary | ICD-10-CM | POA: Insufficient documentation

## 2022-07-24 DIAGNOSIS — I10 Essential (primary) hypertension: Secondary | ICD-10-CM | POA: Diagnosis not present

## 2022-07-24 DIAGNOSIS — L97522 Non-pressure chronic ulcer of other part of left foot with fat layer exposed: Secondary | ICD-10-CM | POA: Diagnosis not present

## 2022-07-24 DIAGNOSIS — S90426A Blister (nonthermal), unspecified lesser toe(s), initial encounter: Secondary | ICD-10-CM | POA: Diagnosis not present

## 2022-07-24 DIAGNOSIS — E11621 Type 2 diabetes mellitus with foot ulcer: Secondary | ICD-10-CM | POA: Diagnosis not present

## 2022-07-24 DIAGNOSIS — S81802A Unspecified open wound, left lower leg, initial encounter: Secondary | ICD-10-CM | POA: Diagnosis not present

## 2022-07-30 DIAGNOSIS — R197 Diarrhea, unspecified: Secondary | ICD-10-CM | POA: Diagnosis not present

## 2022-07-30 DIAGNOSIS — R109 Unspecified abdominal pain: Secondary | ICD-10-CM | POA: Diagnosis not present

## 2022-07-30 DIAGNOSIS — I10 Essential (primary) hypertension: Secondary | ICD-10-CM | POA: Diagnosis not present

## 2022-07-31 DIAGNOSIS — M79672 Pain in left foot: Secondary | ICD-10-CM | POA: Diagnosis not present

## 2022-07-31 DIAGNOSIS — E11610 Type 2 diabetes mellitus with diabetic neuropathic arthropathy: Secondary | ICD-10-CM | POA: Diagnosis not present

## 2022-07-31 DIAGNOSIS — M549 Dorsalgia, unspecified: Secondary | ICD-10-CM | POA: Diagnosis not present

## 2022-07-31 DIAGNOSIS — M25511 Pain in right shoulder: Secondary | ICD-10-CM | POA: Diagnosis not present

## 2022-08-01 ENCOUNTER — Telehealth: Payer: Self-pay | Admitting: Gastroenterology

## 2022-08-01 DIAGNOSIS — R1013 Epigastric pain: Secondary | ICD-10-CM

## 2022-08-01 DIAGNOSIS — K529 Noninfective gastroenteritis and colitis, unspecified: Secondary | ICD-10-CM

## 2022-08-01 DIAGNOSIS — R110 Nausea: Secondary | ICD-10-CM

## 2022-08-01 DIAGNOSIS — R197 Diarrhea, unspecified: Secondary | ICD-10-CM

## 2022-08-01 DIAGNOSIS — R140 Abdominal distension (gaseous): Secondary | ICD-10-CM

## 2022-08-01 NOTE — Telephone Encounter (Signed)
Patient's husband is calling states that patient recently saw her doctor who was wanting her to get an emergency appointment with Dr Loletha Carrow. Please advise

## 2022-08-01 NOTE — Telephone Encounter (Signed)
Returned call to patient. She states that she saw her PCP and they recommended that she see [place] for an "emergency appt". Pt has the same symptoms that she had when she was seen last year. Nausea and diarrhea that has been worse since Thanksgiving. Pt states that she was previously scheduled for an EGD and colonoscopy in 07/2021 but had to cancel. Pt is wanting to know if she could proceed with direct procedures or do you want to see her in the office first? I already scheduled her for a follow up appt on Tuesday, 08/07/22 at 3:20 pm. Pt is aware that you are out of the office this afternoon but we will get back to her as soon as we can. Please advise, thanks.

## 2022-08-02 NOTE — Telephone Encounter (Signed)
Called and spoke with patient. She states that her PCP ordered stool test as well but she has not submitted the sample. Pt's PCP is not within [facility], I encouraged patient to stop by our lab to pick up the C. Diff stool kit and submit as soon as she can. Pt is aware that we scheduled her for the next available double procedure slot on Thursday, 08/30/22 at 3 pm. Pt is aware that she will receive written instructions at her upcoming office visit. Pt verbalized understanding and had no concerns at the end of the call.  C. Diff stool study order in epic. Ambulatory referral to GI in epic.

## 2022-08-02 NOTE — Telephone Encounter (Signed)
Yes, office as visit as you scheduled.  It has been over a year since I last saw her.  Also, C diff PCR  (she periodically receives Abx for a chronic foot problem)  Last, although I certainly need to see her in the office first, it would be best to hold the slots for upper endoscopy and colonoscopy since the December schedule is nearly full.  As it stands now, I appear to have a double length slot open at 8 AM on 08/14/2022.  If she wants to have the procedures within the next 6 weeks, this is the only date available.  Further preprocedure instructions can be given the day of the clinic visit.  Let me know if she needs a prescription for nausea medicine.   - HD

## 2022-08-03 ENCOUNTER — Ambulatory Visit: Payer: BC Managed Care – PPO | Admitting: Internal Medicine

## 2022-08-03 ENCOUNTER — Telehealth: Payer: Self-pay

## 2022-08-03 NOTE — Telephone Encounter (Signed)
Lm on vm asking that patient give me a call back to see if we can get her scheduled for a sooner appt with Dr. Loletha Carrow in the [place] (08/10/22 at 7:30 am, arriving at 7 am).

## 2022-08-07 ENCOUNTER — Ambulatory Visit (INDEPENDENT_AMBULATORY_CARE_PROVIDER_SITE_OTHER): Payer: BC Managed Care – PPO | Admitting: Gastroenterology

## 2022-08-07 ENCOUNTER — Encounter: Payer: Self-pay | Admitting: Gastroenterology

## 2022-08-07 VITALS — BP 136/82 | HR 97 | Ht 68.0 in | Wt 225.0 lb

## 2022-08-07 DIAGNOSIS — R112 Nausea with vomiting, unspecified: Secondary | ICD-10-CM | POA: Diagnosis not present

## 2022-08-07 DIAGNOSIS — K529 Noninfective gastroenteritis and colitis, unspecified: Secondary | ICD-10-CM

## 2022-08-07 DIAGNOSIS — R140 Abdominal distension (gaseous): Secondary | ICD-10-CM | POA: Diagnosis not present

## 2022-08-07 DIAGNOSIS — R1013 Epigastric pain: Secondary | ICD-10-CM

## 2022-08-07 MED ORDER — PLENVU 140 G PO SOLR: 140.0000 g | ORAL | 0 refills | Status: DC

## 2022-08-07 MED ORDER — HYOSCYAMINE SULFATE ER 0.375 MG PO TB12: 0.3750 mg | ORAL_TABLET | Freq: Two times a day (BID) | ORAL | 0 refills | Status: DC

## 2022-08-07 NOTE — Patient Instructions (Signed)
_______________________________________________________  If you are age 56 or older, your body mass index should be between 23-30. Your Body mass index is 34.21 kg/m. If this is out of the aforementioned range listed, please consider follow up with your Primary Care Provider.  If you are age 37 or younger, your body mass index should be between 19-25. Your Body mass index is 34.21 kg/m. If this is out of the aformentioned range listed, please consider follow up with your Primary Care Provider.   ________________________________________________________  The  GI providers would like to encourage you to use [facility] to communicate with providers for non-urgent requests or questions.  Due to long hold times on the telephone, sending your provider a message by [facility] may be a faster and more efficient way to get a response.  Please allow 48 business hours for a response.  Please remember that this is for non-urgent requests.  _______________________________________________________  Donna Kidd have been scheduled for an endoscopy and colonoscopy. Please follow the written instructions given to you at your visit today. Please pick up your prep supplies at the pharmacy within the next 1-3 days. If you use inhalers (even only as needed), please bring them with you on the day of your procedure.   It was a pleasure to see you today!  Thank you for trusting me with your gastrointestinal care!
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
## 2022-08-07 NOTE — Telephone Encounter (Signed)
Pt came in for office visit today, procedures were moved to 08/30/22. bnp

## 2022-08-08 DIAGNOSIS — L97522 Non-pressure chronic ulcer of other part of left foot with fat layer exposed: Secondary | ICD-10-CM | POA: Diagnosis not present

## 2022-08-08 DIAGNOSIS — I10 Essential (primary) hypertension: Secondary | ICD-10-CM | POA: Diagnosis not present

## 2022-08-08 DIAGNOSIS — E11621 Type 2 diabetes mellitus with foot ulcer: Secondary | ICD-10-CM | POA: Diagnosis not present

## 2022-08-09 ENCOUNTER — Encounter: Payer: Self-pay | Admitting: Gastroenterology

## 2022-08-10 ENCOUNTER — Telehealth: Payer: Self-pay

## 2022-08-13 ENCOUNTER — Telehealth: Payer: Self-pay | Admitting: Gastroenterology

## 2022-08-13 ENCOUNTER — Encounter: Payer: Self-pay | Admitting: Gastroenterology

## 2022-08-13 DIAGNOSIS — E11621 Type 2 diabetes mellitus with foot ulcer: Secondary | ICD-10-CM | POA: Diagnosis not present

## 2022-08-13 DIAGNOSIS — L97522 Non-pressure chronic ulcer of other part of left foot with fat layer exposed: Secondary | ICD-10-CM | POA: Diagnosis not present

## 2022-08-13 NOTE — Telephone Encounter (Signed)
Good Morning Dr. Loletha Carrow,   Patient called stating that she needed to cancel her endoscopy and colonoscopy with you tomorrow at 3:00 due to having issues with her insurance.    Patient stated she will call back at a later time to reschedule.

## 2022-08-14 ENCOUNTER — Encounter: Payer: BC Managed Care – PPO | Admitting: Gastroenterology

## 2022-08-21 DIAGNOSIS — L97522 Non-pressure chronic ulcer of other part of left foot with fat layer exposed: Secondary | ICD-10-CM | POA: Diagnosis not present

## 2022-08-21 DIAGNOSIS — E11621 Type 2 diabetes mellitus with foot ulcer: Secondary | ICD-10-CM | POA: Diagnosis not present

## 2022-08-30 ENCOUNTER — Encounter: Payer: BC Managed Care – PPO | Admitting: Gastroenterology

## 2022-09-04 DIAGNOSIS — E1143 Type 2 diabetes mellitus with diabetic autonomic (poly)neuropathy: Secondary | ICD-10-CM | POA: Diagnosis not present

## 2022-09-04 DIAGNOSIS — L97522 Non-pressure chronic ulcer of other part of left foot with fat layer exposed: Secondary | ICD-10-CM | POA: Diagnosis not present

## 2022-09-04 DIAGNOSIS — E11621 Type 2 diabetes mellitus with foot ulcer: Secondary | ICD-10-CM | POA: Diagnosis not present

## 2022-09-04 DIAGNOSIS — E11610 Type 2 diabetes mellitus with diabetic neuropathic arthropathy: Secondary | ICD-10-CM | POA: Diagnosis not present

## 2022-09-19 DIAGNOSIS — E11610 Type 2 diabetes mellitus with diabetic neuropathic arthropathy: Secondary | ICD-10-CM | POA: Diagnosis not present

## 2022-09-19 DIAGNOSIS — L97524 Non-pressure chronic ulcer of other part of left foot with necrosis of bone: Secondary | ICD-10-CM | POA: Diagnosis not present

## 2022-09-19 DIAGNOSIS — E11621 Type 2 diabetes mellitus with foot ulcer: Secondary | ICD-10-CM | POA: Diagnosis not present

## 2022-09-19 DIAGNOSIS — L97522 Non-pressure chronic ulcer of other part of left foot with fat layer exposed: Secondary | ICD-10-CM | POA: Diagnosis not present

## 2022-09-27 DIAGNOSIS — E11610 Type 2 diabetes mellitus with diabetic neuropathic arthropathy: Secondary | ICD-10-CM | POA: Diagnosis not present

## 2022-09-27 DIAGNOSIS — M79672 Pain in left foot: Secondary | ICD-10-CM | POA: Diagnosis not present

## 2022-09-27 DIAGNOSIS — L97524 Non-pressure chronic ulcer of other part of left foot with necrosis of bone: Secondary | ICD-10-CM | POA: Diagnosis not present

## 2022-10-12 DIAGNOSIS — I739 Peripheral vascular disease, unspecified: Secondary | ICD-10-CM | POA: Diagnosis not present

## 2022-10-17 DIAGNOSIS — E1143 Type 2 diabetes mellitus with diabetic autonomic (poly)neuropathy: Secondary | ICD-10-CM | POA: Diagnosis not present

## 2022-10-17 DIAGNOSIS — L97524 Non-pressure chronic ulcer of other part of left foot with necrosis of bone: Secondary | ICD-10-CM | POA: Diagnosis not present

## 2022-10-17 DIAGNOSIS — E11610 Type 2 diabetes mellitus with diabetic neuropathic arthropathy: Secondary | ICD-10-CM | POA: Diagnosis not present

## 2022-10-18 DIAGNOSIS — Z01810 Encounter for preprocedural cardiovascular examination: Secondary | ICD-10-CM | POA: Diagnosis not present

## 2022-10-18 DIAGNOSIS — L97524 Non-pressure chronic ulcer of other part of left foot with necrosis of bone: Secondary | ICD-10-CM | POA: Insufficient documentation

## 2022-10-18 DIAGNOSIS — E11610 Type 2 diabetes mellitus with diabetic neuropathic arthropathy: Secondary | ICD-10-CM | POA: Diagnosis not present

## 2022-11-07 ENCOUNTER — Encounter: Payer: Self-pay | Admitting: Internal Medicine

## 2022-11-14 ENCOUNTER — Encounter: Payer: Self-pay | Admitting: Internal Medicine

## 2022-11-14 ENCOUNTER — Ambulatory Visit: Payer: BC Managed Care – PPO | Admitting: Internal Medicine

## 2022-11-14 VITALS — BP 124/80 | HR 80 | Ht 68.0 in | Wt 248.0 lb

## 2022-11-14 DIAGNOSIS — E1165 Type 2 diabetes mellitus with hyperglycemia: Secondary | ICD-10-CM

## 2022-11-14 DIAGNOSIS — E1142 Type 2 diabetes mellitus with diabetic polyneuropathy: Secondary | ICD-10-CM

## 2022-11-14 DIAGNOSIS — E113599 Type 2 diabetes mellitus with proliferative diabetic retinopathy without macular edema, unspecified eye: Secondary | ICD-10-CM | POA: Diagnosis not present

## 2022-11-14 DIAGNOSIS — Z794 Long term (current) use of insulin: Secondary | ICD-10-CM | POA: Diagnosis not present

## 2022-11-14 DIAGNOSIS — E11610 Type 2 diabetes mellitus with diabetic neuropathic arthropathy: Secondary | ICD-10-CM

## 2022-11-14 LAB — POCT GLUCOSE (DEVICE FOR HOME USE): Glucose Fasting, POC: 146 mg/dL — AB (ref 70–99)

## 2022-11-14 LAB — POCT GLYCOSYLATED HEMOGLOBIN (HGB A1C): Hemoglobin A1C: 9.5 % — AB (ref 4.0–5.6)

## 2022-11-14 MED ORDER — TIRZEPATIDE 2.5 MG/0.5ML ~~LOC~~ SOAJ: 2.5000 mg | SUBCUTANEOUS | 6 refills | Status: DC

## 2022-11-14 NOTE — Patient Instructions (Addendum)
Start Mounjaro 2.5 mg once [employer] 14 units Before each meal  Novolog correctional insulin: ADD extra units on insulin to your meal-time Novolog dose if your blood sugars are higher than 160. Use the scale below to help guide you before each meal and bedtime   Blood sugar before meal Number of units to inject  Less than 160 0 unit  161 -  190 1 units  191 -  220 2 units  221 -  250 3 units  251 -  280 4 units  281 -  310 5 units  311 -  340 6 units  341 -  370 7 units  371 -  400 8 units      HOW TO TREAT LOW BLOOD SUGARS (Blood sugar LESS THAN 70 MG/DL) Please follow the RULE OF 15 for the treatment of hypoglycemia treatment (when your (blood sugars are less than 70 mg/dL)   STEP 1: Take 15 grams of carbohydrates when your blood sugar is low, which includes:  3-4 GLUCOSE TABS  OR 3-4 OZ OF JUICE OR REGULAR SODA OR ONE TUBE OF GLUCOSE GEL    STEP 2: RECHECK blood sugar in 15 MINUTES STEP 3: If your blood sugar is still low at the 15 minute recheck --> then, go back to STEP 1 and treat AGAIN with another 15 grams of carbohydrates.
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
## 2022-11-16 DIAGNOSIS — E1169 Type 2 diabetes mellitus with other specified complication: Secondary | ICD-10-CM | POA: Diagnosis not present

## 2022-11-16 DIAGNOSIS — E6609 Other obesity due to excess calories: Secondary | ICD-10-CM | POA: Diagnosis not present

## 2022-11-16 DIAGNOSIS — Z6836 Body mass index (BMI) 36.0-36.9, adult: Secondary | ICD-10-CM | POA: Diagnosis not present

## 2022-11-16 DIAGNOSIS — Z8616 Personal history of COVID-19: Secondary | ICD-10-CM | POA: Diagnosis not present

## 2022-11-16 DIAGNOSIS — L97424 Non-pressure chronic ulcer of left heel and midfoot with necrosis of bone: Secondary | ICD-10-CM | POA: Diagnosis not present

## 2022-11-16 DIAGNOSIS — E11610 Type 2 diabetes mellitus with diabetic neuropathic arthropathy: Secondary | ICD-10-CM | POA: Diagnosis not present

## 2022-11-16 DIAGNOSIS — M86672 Other chronic osteomyelitis, left ankle and foot: Secondary | ICD-10-CM | POA: Diagnosis not present

## 2022-11-16 DIAGNOSIS — G4733 Obstructive sleep apnea (adult) (pediatric): Secondary | ICD-10-CM | POA: Diagnosis not present

## 2022-11-16 DIAGNOSIS — F32A Depression, unspecified: Secondary | ICD-10-CM | POA: Diagnosis not present

## 2022-11-16 DIAGNOSIS — E1165 Type 2 diabetes mellitus with hyperglycemia: Secondary | ICD-10-CM | POA: Diagnosis not present

## 2022-11-16 DIAGNOSIS — F4310 Post-traumatic stress disorder, unspecified: Secondary | ICD-10-CM | POA: Diagnosis not present

## 2022-11-16 DIAGNOSIS — E113511 Type 2 diabetes mellitus with proliferative diabetic retinopathy with macular edema, right eye: Secondary | ICD-10-CM | POA: Diagnosis not present

## 2022-11-16 DIAGNOSIS — E11621 Type 2 diabetes mellitus with foot ulcer: Secondary | ICD-10-CM | POA: Diagnosis not present

## 2022-11-16 DIAGNOSIS — I10 Essential (primary) hypertension: Secondary | ICD-10-CM | POA: Diagnosis not present

## 2022-11-16 DIAGNOSIS — Z9884 Bariatric surgery status: Secondary | ICD-10-CM | POA: Diagnosis not present

## 2022-11-16 DIAGNOSIS — L97509 Non-pressure chronic ulcer of other part of unspecified foot with unspecified severity: Secondary | ICD-10-CM | POA: Diagnosis not present

## 2022-11-16 DIAGNOSIS — L97428 Non-pressure chronic ulcer of left heel and midfoot with other specified severity: Secondary | ICD-10-CM | POA: Diagnosis not present

## 2022-11-16 DIAGNOSIS — F419 Anxiety disorder, unspecified: Secondary | ICD-10-CM | POA: Diagnosis not present

## 2022-11-16 DIAGNOSIS — L97524 Non-pressure chronic ulcer of other part of left foot with necrosis of bone: Secondary | ICD-10-CM | POA: Diagnosis not present

## 2022-11-16 DIAGNOSIS — M869 Osteomyelitis, unspecified: Secondary | ICD-10-CM | POA: Diagnosis not present

## 2022-11-18 DIAGNOSIS — Z91199 Patient's noncompliance with other medical treatment and regimen due to unspecified reason: Secondary | ICD-10-CM | POA: Insufficient documentation

## 2022-11-22 DIAGNOSIS — E11610 Type 2 diabetes mellitus with diabetic neuropathic arthropathy: Secondary | ICD-10-CM | POA: Diagnosis not present

## 2022-11-22 DIAGNOSIS — Z794 Long term (current) use of insulin: Secondary | ICD-10-CM | POA: Diagnosis not present

## 2022-11-22 DIAGNOSIS — R262 Difficulty in walking, not elsewhere classified: Secondary | ICD-10-CM | POA: Diagnosis not present

## 2022-11-22 DIAGNOSIS — E1140 Type 2 diabetes mellitus with diabetic neuropathy, unspecified: Secondary | ICD-10-CM | POA: Diagnosis not present

## 2022-11-22 DIAGNOSIS — R2689 Other abnormalities of gait and mobility: Secondary | ICD-10-CM | POA: Diagnosis not present

## 2022-11-22 DIAGNOSIS — I10 Essential (primary) hypertension: Secondary | ICD-10-CM | POA: Diagnosis not present

## 2022-11-22 DIAGNOSIS — Z743 Need for continuous supervision: Secondary | ICD-10-CM | POA: Diagnosis not present

## 2022-11-22 DIAGNOSIS — I959 Hypotension, unspecified: Secondary | ICD-10-CM | POA: Diagnosis not present

## 2022-11-22 DIAGNOSIS — K5909 Other constipation: Secondary | ICD-10-CM | POA: Diagnosis not present

## 2022-11-22 DIAGNOSIS — Z4781 Encounter for orthopedic aftercare following surgical amputation: Secondary | ICD-10-CM | POA: Diagnosis not present

## 2022-11-22 DIAGNOSIS — F411 Generalized anxiety disorder: Secondary | ICD-10-CM | POA: Diagnosis not present

## 2022-11-22 DIAGNOSIS — E1165 Type 2 diabetes mellitus with hyperglycemia: Secondary | ICD-10-CM | POA: Diagnosis not present

## 2022-11-22 DIAGNOSIS — E6609 Other obesity due to excess calories: Secondary | ICD-10-CM | POA: Diagnosis not present

## 2022-11-22 DIAGNOSIS — G8929 Other chronic pain: Secondary | ICD-10-CM | POA: Diagnosis not present

## 2022-11-22 DIAGNOSIS — M6281 Muscle weakness (generalized): Secondary | ICD-10-CM | POA: Diagnosis not present

## 2022-11-22 DIAGNOSIS — F329 Major depressive disorder, single episode, unspecified: Secondary | ICD-10-CM | POA: Diagnosis not present

## 2022-11-22 DIAGNOSIS — Z89512 Acquired absence of left leg below knee: Secondary | ICD-10-CM | POA: Diagnosis not present

## 2022-11-22 DIAGNOSIS — M792 Neuralgia and neuritis, unspecified: Secondary | ICD-10-CM | POA: Diagnosis not present

## 2022-11-22 DIAGNOSIS — E785 Hyperlipidemia, unspecified: Secondary | ICD-10-CM | POA: Diagnosis not present

## 2022-11-22 DIAGNOSIS — K5900 Constipation, unspecified: Secondary | ICD-10-CM | POA: Diagnosis not present

## 2022-11-22 DIAGNOSIS — E1142 Type 2 diabetes mellitus with diabetic polyneuropathy: Secondary | ICD-10-CM | POA: Diagnosis not present

## 2022-11-22 DIAGNOSIS — N179 Acute kidney failure, unspecified: Secondary | ICD-10-CM | POA: Diagnosis not present

## 2022-11-29 ENCOUNTER — Encounter (INDEPENDENT_AMBULATORY_CARE_PROVIDER_SITE_OTHER): Payer: BC Managed Care – PPO | Admitting: Ophthalmology

## 2022-12-14 ENCOUNTER — Other Ambulatory Visit: Payer: Self-pay | Admitting: Internal Medicine

## 2023-01-03 ENCOUNTER — Encounter: Payer: Self-pay | Admitting: Internal Medicine

## 2023-01-04 ENCOUNTER — Other Ambulatory Visit: Payer: Self-pay | Admitting: Internal Medicine

## 2023-01-04 MED ORDER — DEXCOM G7 SENSOR MISC: 1.0000 | 3 refills | Status: DC

## 2023-01-04 MED ORDER — TIRZEPATIDE 5 MG/0.5ML ~~LOC~~ SOAJ: 5.0000 mg | SUBCUTANEOUS | 3 refills | Status: DC

## 2023-01-08 ENCOUNTER — Telehealth: Payer: Self-pay

## 2023-01-08 MED ORDER — TIRZEPATIDE 2.5 MG/0.5ML ~~LOC~~ SOAJ: 2.5000 mg | SUBCUTANEOUS | 0 refills | Status: DC

## 2023-01-08 NOTE — Telephone Encounter (Signed)
Script sent for the 2.5mg  of [facility]

## 2023-01-08 NOTE — Telephone Encounter (Signed)
Per [facility] the [facility] 5mg  is not available they have the 10 mg dose.

## 2023-01-31 DIAGNOSIS — F320 Major depressive disorder, single episode, mild: Secondary | ICD-10-CM | POA: Diagnosis not present

## 2023-01-31 DIAGNOSIS — E1142 Type 2 diabetes mellitus with diabetic polyneuropathy: Secondary | ICD-10-CM | POA: Diagnosis not present

## 2023-01-31 DIAGNOSIS — I10 Essential (primary) hypertension: Secondary | ICD-10-CM | POA: Diagnosis not present

## 2023-01-31 DIAGNOSIS — Z89512 Acquired absence of left leg below knee: Secondary | ICD-10-CM | POA: Diagnosis not present

## 2023-03-25 DIAGNOSIS — Z79899 Other long term (current) drug therapy: Secondary | ICD-10-CM | POA: Diagnosis not present

## 2023-03-25 DIAGNOSIS — M79672 Pain in left foot: Secondary | ICD-10-CM | POA: Diagnosis not present

## 2023-03-25 DIAGNOSIS — Z5181 Encounter for therapeutic drug level monitoring: Secondary | ICD-10-CM | POA: Diagnosis not present

## 2023-03-25 DIAGNOSIS — M549 Dorsalgia, unspecified: Secondary | ICD-10-CM | POA: Diagnosis not present

## 2023-03-25 DIAGNOSIS — E11610 Type 2 diabetes mellitus with diabetic neuropathic arthropathy: Secondary | ICD-10-CM | POA: Diagnosis not present

## 2023-03-25 DIAGNOSIS — M25511 Pain in right shoulder: Secondary | ICD-10-CM | POA: Diagnosis not present

## 2023-03-28 DIAGNOSIS — Z89512 Acquired absence of left leg below knee: Secondary | ICD-10-CM | POA: Diagnosis not present

## 2023-04-01 ENCOUNTER — Other Ambulatory Visit: Payer: Self-pay

## 2023-04-01 ENCOUNTER — Encounter: Payer: Self-pay | Admitting: Physical Therapy

## 2023-04-01 ENCOUNTER — Ambulatory Visit (INDEPENDENT_AMBULATORY_CARE_PROVIDER_SITE_OTHER): Payer: BC Managed Care – PPO | Admitting: Physical Therapy

## 2023-04-01 DIAGNOSIS — R531 Weakness: Secondary | ICD-10-CM

## 2023-04-01 DIAGNOSIS — Z9181 History of falling: Secondary | ICD-10-CM | POA: Diagnosis not present

## 2023-04-01 DIAGNOSIS — R2681 Unsteadiness on feet: Secondary | ICD-10-CM

## 2023-04-01 DIAGNOSIS — R2689 Other abnormalities of gait and mobility: Secondary | ICD-10-CM

## 2023-04-01 DIAGNOSIS — Z7409 Other reduced mobility: Secondary | ICD-10-CM

## 2023-04-01 DIAGNOSIS — M6281 Muscle weakness (generalized): Secondary | ICD-10-CM | POA: Diagnosis not present
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
## 2023-04-02 ENCOUNTER — Encounter: Payer: Self-pay | Admitting: Physical Therapy

## 2023-04-02 ENCOUNTER — Ambulatory Visit (INDEPENDENT_AMBULATORY_CARE_PROVIDER_SITE_OTHER): Payer: BC Managed Care – PPO | Admitting: Physical Therapy

## 2023-04-02 DIAGNOSIS — Z7409 Other reduced mobility: Secondary | ICD-10-CM | POA: Diagnosis not present

## 2023-04-02 DIAGNOSIS — M6281 Muscle weakness (generalized): Secondary | ICD-10-CM

## 2023-04-02 DIAGNOSIS — R2689 Other abnormalities of gait and mobility: Secondary | ICD-10-CM

## 2023-04-02 DIAGNOSIS — R2681 Unsteadiness on feet: Secondary | ICD-10-CM | POA: Diagnosis not present

## 2023-04-02 DIAGNOSIS — Z9181 History of falling: Secondary | ICD-10-CM
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
## 2023-04-08 ENCOUNTER — Ambulatory Visit (INDEPENDENT_AMBULATORY_CARE_PROVIDER_SITE_OTHER): Payer: BC Managed Care – PPO | Admitting: Physical Therapy

## 2023-04-08 ENCOUNTER — Encounter: Payer: Self-pay | Admitting: Physical Therapy

## 2023-04-08 DIAGNOSIS — M6281 Muscle weakness (generalized): Secondary | ICD-10-CM | POA: Diagnosis not present

## 2023-04-08 DIAGNOSIS — R2681 Unsteadiness on feet: Secondary | ICD-10-CM | POA: Diagnosis not present

## 2023-04-08 DIAGNOSIS — Z7409 Other reduced mobility: Secondary | ICD-10-CM

## 2023-04-08 DIAGNOSIS — Z9181 History of falling: Secondary | ICD-10-CM

## 2023-04-08 DIAGNOSIS — R2689 Other abnormalities of gait and mobility: Secondary | ICD-10-CM
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
## 2023-04-17 ENCOUNTER — Ambulatory Visit (INDEPENDENT_AMBULATORY_CARE_PROVIDER_SITE_OTHER): Payer: BC Managed Care – PPO | Admitting: Physical Therapy

## 2023-04-17 ENCOUNTER — Encounter: Payer: Self-pay | Admitting: Physical Therapy

## 2023-04-17 DIAGNOSIS — M6281 Muscle weakness (generalized): Secondary | ICD-10-CM | POA: Diagnosis not present

## 2023-04-17 DIAGNOSIS — Z7409 Other reduced mobility: Secondary | ICD-10-CM

## 2023-04-17 DIAGNOSIS — R2689 Other abnormalities of gait and mobility: Secondary | ICD-10-CM | POA: Diagnosis not present

## 2023-04-17 DIAGNOSIS — Z9181 History of falling: Secondary | ICD-10-CM

## 2023-04-17 DIAGNOSIS — R2681 Unsteadiness on feet: Secondary | ICD-10-CM
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
## 2023-04-18 ENCOUNTER — Other Ambulatory Visit: Payer: Self-pay | Admitting: Internal Medicine

## 2023-04-22 ENCOUNTER — Encounter: Payer: Self-pay | Admitting: Physical Therapy

## 2023-04-22 ENCOUNTER — Ambulatory Visit (INDEPENDENT_AMBULATORY_CARE_PROVIDER_SITE_OTHER): Payer: BC Managed Care – PPO | Admitting: Physical Therapy

## 2023-04-22 DIAGNOSIS — R2681 Unsteadiness on feet: Secondary | ICD-10-CM | POA: Diagnosis not present

## 2023-04-22 DIAGNOSIS — M6281 Muscle weakness (generalized): Secondary | ICD-10-CM

## 2023-04-22 DIAGNOSIS — R2689 Other abnormalities of gait and mobility: Secondary | ICD-10-CM

## 2023-04-22 DIAGNOSIS — Z9181 History of falling: Secondary | ICD-10-CM

## 2023-04-22 DIAGNOSIS — Z7409 Other reduced mobility: Secondary | ICD-10-CM
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
## 2023-04-24 ENCOUNTER — Ambulatory Visit (INDEPENDENT_AMBULATORY_CARE_PROVIDER_SITE_OTHER): Payer: BC Managed Care – PPO | Admitting: Physical Therapy

## 2023-04-24 ENCOUNTER — Encounter: Payer: Self-pay | Admitting: Physical Therapy

## 2023-04-24 DIAGNOSIS — R2689 Other abnormalities of gait and mobility: Secondary | ICD-10-CM | POA: Diagnosis not present

## 2023-04-24 DIAGNOSIS — R2681 Unsteadiness on feet: Secondary | ICD-10-CM | POA: Diagnosis not present

## 2023-04-24 DIAGNOSIS — M6281 Muscle weakness (generalized): Secondary | ICD-10-CM

## 2023-04-24 DIAGNOSIS — Z7409 Other reduced mobility: Secondary | ICD-10-CM

## 2023-04-24 DIAGNOSIS — Z9181 History of falling: Secondary | ICD-10-CM
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
## 2023-04-30 ENCOUNTER — Ambulatory Visit (INDEPENDENT_AMBULATORY_CARE_PROVIDER_SITE_OTHER): Payer: BC Managed Care – PPO | Admitting: Physical Therapy

## 2023-04-30 ENCOUNTER — Encounter: Payer: Self-pay | Admitting: Physical Therapy

## 2023-04-30 DIAGNOSIS — R2689 Other abnormalities of gait and mobility: Secondary | ICD-10-CM

## 2023-04-30 DIAGNOSIS — R2681 Unsteadiness on feet: Secondary | ICD-10-CM

## 2023-04-30 DIAGNOSIS — Z7409 Other reduced mobility: Secondary | ICD-10-CM | POA: Diagnosis not present

## 2023-04-30 DIAGNOSIS — Z9181 History of falling: Secondary | ICD-10-CM

## 2023-04-30 DIAGNOSIS — M6281 Muscle weakness (generalized): Secondary | ICD-10-CM
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
## 2023-05-02 ENCOUNTER — Ambulatory Visit (INDEPENDENT_AMBULATORY_CARE_PROVIDER_SITE_OTHER): Payer: BC Managed Care – PPO | Admitting: Physical Therapy

## 2023-05-02 ENCOUNTER — Encounter: Payer: Self-pay | Admitting: Physical Therapy

## 2023-05-02 DIAGNOSIS — Z7409 Other reduced mobility: Secondary | ICD-10-CM

## 2023-05-02 DIAGNOSIS — R2689 Other abnormalities of gait and mobility: Secondary | ICD-10-CM | POA: Diagnosis not present

## 2023-05-02 DIAGNOSIS — Z9181 History of falling: Secondary | ICD-10-CM

## 2023-05-02 DIAGNOSIS — M6281 Muscle weakness (generalized): Secondary | ICD-10-CM | POA: Diagnosis not present

## 2023-05-02 DIAGNOSIS — R2681 Unsteadiness on feet: Secondary | ICD-10-CM | POA: Diagnosis not present
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
## 2023-05-06 ENCOUNTER — Ambulatory Visit (INDEPENDENT_AMBULATORY_CARE_PROVIDER_SITE_OTHER): Payer: BC Managed Care – PPO | Admitting: Physical Therapy

## 2023-05-06 ENCOUNTER — Encounter: Payer: Self-pay | Admitting: Physical Therapy

## 2023-05-06 DIAGNOSIS — R2681 Unsteadiness on feet: Secondary | ICD-10-CM | POA: Diagnosis not present

## 2023-05-06 DIAGNOSIS — M6281 Muscle weakness (generalized): Secondary | ICD-10-CM

## 2023-05-06 DIAGNOSIS — Z7409 Other reduced mobility: Secondary | ICD-10-CM | POA: Diagnosis not present

## 2023-05-06 DIAGNOSIS — R2689 Other abnormalities of gait and mobility: Secondary | ICD-10-CM
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
## 2023-05-08 ENCOUNTER — Encounter: Payer: Self-pay | Admitting: Physical Therapy

## 2023-05-08 ENCOUNTER — Ambulatory Visit (INDEPENDENT_AMBULATORY_CARE_PROVIDER_SITE_OTHER): Payer: BC Managed Care – PPO | Admitting: Physical Therapy

## 2023-05-08 DIAGNOSIS — R2681 Unsteadiness on feet: Secondary | ICD-10-CM

## 2023-05-08 DIAGNOSIS — Z7409 Other reduced mobility: Secondary | ICD-10-CM

## 2023-05-08 DIAGNOSIS — R2689 Other abnormalities of gait and mobility: Secondary | ICD-10-CM | POA: Diagnosis not present

## 2023-05-08 DIAGNOSIS — M6281 Muscle weakness (generalized): Secondary | ICD-10-CM | POA: Diagnosis not present
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
## 2023-05-13 ENCOUNTER — Encounter: Payer: Self-pay | Admitting: Physical Therapy

## 2023-05-13 ENCOUNTER — Ambulatory Visit (INDEPENDENT_AMBULATORY_CARE_PROVIDER_SITE_OTHER): Payer: BC Managed Care – PPO | Admitting: Physical Therapy

## 2023-05-13 DIAGNOSIS — Z7409 Other reduced mobility: Secondary | ICD-10-CM

## 2023-05-13 DIAGNOSIS — R2689 Other abnormalities of gait and mobility: Secondary | ICD-10-CM | POA: Diagnosis not present

## 2023-05-13 DIAGNOSIS — R2681 Unsteadiness on feet: Secondary | ICD-10-CM

## 2023-05-13 DIAGNOSIS — M6281 Muscle weakness (generalized): Secondary | ICD-10-CM | POA: Diagnosis not present
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
## 2023-05-16 ENCOUNTER — Ambulatory Visit (INDEPENDENT_AMBULATORY_CARE_PROVIDER_SITE_OTHER): Payer: BC Managed Care – PPO | Admitting: Physical Therapy

## 2023-05-16 ENCOUNTER — Encounter: Payer: Self-pay | Admitting: Physical Therapy

## 2023-05-16 DIAGNOSIS — R2689 Other abnormalities of gait and mobility: Secondary | ICD-10-CM

## 2023-05-16 DIAGNOSIS — R2681 Unsteadiness on feet: Secondary | ICD-10-CM | POA: Diagnosis not present

## 2023-05-16 DIAGNOSIS — Z7409 Other reduced mobility: Secondary | ICD-10-CM

## 2023-05-16 DIAGNOSIS — M6281 Muscle weakness (generalized): Secondary | ICD-10-CM

## 2023-05-16 DIAGNOSIS — Z9181 History of falling: Secondary | ICD-10-CM
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
## 2023-05-21 ENCOUNTER — Encounter: Payer: BC Managed Care – PPO | Admitting: Physical Therapy

## 2023-05-23 ENCOUNTER — Telehealth: Payer: Self-pay | Admitting: Internal Medicine

## 2023-05-23 ENCOUNTER — Encounter: Payer: Self-pay | Admitting: Physical Therapy

## 2023-05-23 ENCOUNTER — Ambulatory Visit: Payer: BC Managed Care – PPO | Admitting: Physical Therapy

## 2023-05-23 DIAGNOSIS — Z7409 Other reduced mobility: Secondary | ICD-10-CM | POA: Diagnosis not present

## 2023-05-23 DIAGNOSIS — R2681 Unsteadiness on feet: Secondary | ICD-10-CM

## 2023-05-23 DIAGNOSIS — M6281 Muscle weakness (generalized): Secondary | ICD-10-CM

## 2023-05-23 DIAGNOSIS — R2689 Other abnormalities of gait and mobility: Secondary | ICD-10-CM

## 2023-05-23 MED ORDER — NOVOLOG FLEXPEN 100 UNIT/ML ~~LOC~~ SOPN: PEN_INJECTOR | SUBCUTANEOUS | 1 refills | Status: DC

## 2023-05-23 NOTE — Telephone Encounter (Signed)
Done

## 2023-05-23 NOTE — Telephone Encounter (Signed)
Patient advising she is out of her Novolog Flex pens. Please send RX to [facility] on [address]. Advise patient when done
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
## 2023-05-28 DIAGNOSIS — R0982 Postnasal drip: Secondary | ICD-10-CM | POA: Diagnosis not present

## 2023-05-28 DIAGNOSIS — Z794 Long term (current) use of insulin: Secondary | ICD-10-CM | POA: Diagnosis not present

## 2023-05-28 DIAGNOSIS — H60391 Other infective otitis externa, right ear: Secondary | ICD-10-CM | POA: Diagnosis not present

## 2023-05-28 DIAGNOSIS — R059 Cough, unspecified: Secondary | ICD-10-CM | POA: Diagnosis not present

## 2023-05-28 DIAGNOSIS — E1169 Type 2 diabetes mellitus with other specified complication: Secondary | ICD-10-CM | POA: Diagnosis not present

## 2023-06-04 ENCOUNTER — Ambulatory Visit: Payer: BC Managed Care – PPO | Admitting: Physical Therapy

## 2023-06-04 ENCOUNTER — Encounter: Payer: Self-pay | Admitting: Physical Therapy

## 2023-06-04 DIAGNOSIS — M6281 Muscle weakness (generalized): Secondary | ICD-10-CM

## 2023-06-04 DIAGNOSIS — R2681 Unsteadiness on feet: Secondary | ICD-10-CM

## 2023-06-04 DIAGNOSIS — R2689 Other abnormalities of gait and mobility: Secondary | ICD-10-CM

## 2023-06-04 DIAGNOSIS — H65191 Other acute nonsuppurative otitis media, right ear: Secondary | ICD-10-CM | POA: Diagnosis not present

## 2023-06-04 DIAGNOSIS — Z7409 Other reduced mobility: Secondary | ICD-10-CM | POA: Diagnosis not present
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
## 2023-06-13 ENCOUNTER — Ambulatory Visit (INDEPENDENT_AMBULATORY_CARE_PROVIDER_SITE_OTHER): Payer: BC Managed Care – PPO | Admitting: Internal Medicine

## 2023-06-13 ENCOUNTER — Encounter: Payer: Self-pay | Admitting: Internal Medicine

## 2023-06-13 VITALS — BP 122/80 | HR 88 | Ht 68.0 in | Wt 258.0 lb

## 2023-06-13 DIAGNOSIS — E113599 Type 2 diabetes mellitus with proliferative diabetic retinopathy without macular edema, unspecified eye: Secondary | ICD-10-CM

## 2023-06-13 DIAGNOSIS — Z794 Long term (current) use of insulin: Secondary | ICD-10-CM | POA: Diagnosis not present

## 2023-06-13 DIAGNOSIS — Z23 Encounter for immunization: Secondary | ICD-10-CM

## 2023-06-13 DIAGNOSIS — E1142 Type 2 diabetes mellitus with diabetic polyneuropathy: Secondary | ICD-10-CM | POA: Diagnosis not present

## 2023-06-13 DIAGNOSIS — H65191 Other acute nonsuppurative otitis media, right ear: Secondary | ICD-10-CM | POA: Diagnosis not present

## 2023-06-13 DIAGNOSIS — E1165 Type 2 diabetes mellitus with hyperglycemia: Secondary | ICD-10-CM

## 2023-06-13 DIAGNOSIS — Z89512 Acquired absence of left leg below knee: Secondary | ICD-10-CM

## 2023-06-13 LAB — POCT GLYCOSYLATED HEMOGLOBIN (HGB A1C): Hemoglobin A1C: 9.2 % — AB (ref 4.0–5.6)

## 2023-06-13 MED ORDER — INSULIN PEN NEEDLE 31G X 5 MM MISC: 1.0000 | Freq: Four times a day (QID) | 2 refills | Status: DC

## 2023-06-13 MED ORDER — TIRZEPATIDE 7.5 MG/0.5ML ~~LOC~~ SOAJ
7.5000 mg | SUBCUTANEOUS | 3 refills | Status: DC
Start: 2023-06-13 — End: 2024-07-14

## 2023-06-13 MED ORDER — TRESIBA FLEXTOUCH 100 UNIT/ML ~~LOC~~ SOPN: 26.0000 [IU] | PEN_INJECTOR | Freq: Every day | SUBCUTANEOUS | 3 refills | Status: DC
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
## 2023-06-13 NOTE — Patient Instructions (Signed)
Increase  Mounjaro 7.5 mg once weeky  Novolog 14 units Before each meal  Start Tresiba 26 units once daily  Novolog correctional insulin: ADD extra units on insulin to your meal-time Novolog dose if your blood sugars are higher than 160. Use the scale below to help guide you before each meal and bedtime   Blood sugar before meal Number of units to inject  Less than 160 0 unit  161 -  190 1 units  191 -  220 2 units  221 -  250 3 units  251 -  280 4 units  281 -  310 5 units  311 -  340 6 units  341 -  370 7 units  371 -  400 8 units      HOW TO TREAT LOW BLOOD SUGARS (Blood sugar LESS THAN 70 MG/DL) Please follow the RULE OF 15 for the treatment of hypoglycemia treatment (when your (blood sugars are less than 70 mg/dL)   STEP 1: Take 15 grams of carbohydrates when your blood sugar is low, which includes:  3-4 GLUCOSE TABS  OR 3-4 OZ OF JUICE OR REGULAR SODA OR ONE TUBE OF GLUCOSE GEL    STEP 2: RECHECK blood sugar in 15 MINUTES STEP 3: If your blood sugar is still low at the 15 minute recheck --> then, go back to STEP 1 and treat AGAIN with another 15 grams of carbohydrates.

## 2023-06-14 ENCOUNTER — Telehealth: Payer: Self-pay

## 2023-06-14 ENCOUNTER — Other Ambulatory Visit (HOSPITAL_COMMUNITY): Payer: Self-pay

## 2023-06-14 DIAGNOSIS — Z23 Encounter for immunization: Secondary | ICD-10-CM | POA: Insufficient documentation

## 2023-06-14 DIAGNOSIS — Z89512 Acquired absence of left leg below knee: Secondary | ICD-10-CM | POA: Insufficient documentation

## 2023-06-14 MED ORDER — TRUEPLUS 5-BEVEL PEN NEEDLES 31G X 5 MM MISC: 2 refills | Status: DC

## 2023-06-14 NOTE — Telephone Encounter (Signed)
Patient aware and spoke with pharmacy and they will get script ready

## 2023-06-14 NOTE — Telephone Encounter (Signed)
PA needed on Tresiba and Pen needles.

## 2023-06-17 DIAGNOSIS — G894 Chronic pain syndrome: Secondary | ICD-10-CM | POA: Diagnosis not present

## 2023-06-17 DIAGNOSIS — M79672 Pain in left foot: Secondary | ICD-10-CM | POA: Diagnosis not present

## 2023-06-17 DIAGNOSIS — M25511 Pain in right shoulder: Secondary | ICD-10-CM | POA: Diagnosis not present

## 2023-07-12 ENCOUNTER — Ambulatory Visit: Payer: BC Managed Care – PPO | Admitting: Internal Medicine

## 2023-07-28 DIAGNOSIS — Z419 Encounter for procedure for purposes other than remedying health state, unspecified: Secondary | ICD-10-CM | POA: Diagnosis not present

## 2023-07-31 DIAGNOSIS — H26492 Other secondary cataract, left eye: Secondary | ICD-10-CM | POA: Diagnosis not present

## 2023-07-31 DIAGNOSIS — Z961 Presence of intraocular lens: Secondary | ICD-10-CM | POA: Diagnosis not present

## 2023-07-31 DIAGNOSIS — H5211 Myopia, right eye: Secondary | ICD-10-CM | POA: Diagnosis not present

## 2023-07-31 DIAGNOSIS — E119 Type 2 diabetes mellitus without complications: Secondary | ICD-10-CM | POA: Diagnosis not present

## 2023-07-31 LAB — HM DIABETES EYE EXAM

## 2023-08-02 ENCOUNTER — Encounter: Payer: Self-pay | Admitting: Internal Medicine

## 2023-08-28 DIAGNOSIS — Z419 Encounter for procedure for purposes other than remedying health state, unspecified: Secondary | ICD-10-CM | POA: Diagnosis not present

## 2023-09-10 ENCOUNTER — Encounter: Payer: Self-pay | Admitting: Internal Medicine

## 2023-09-11 ENCOUNTER — Telehealth: Payer: Self-pay

## 2023-09-11 NOTE — Telephone Encounter (Signed)
 My  Donna Kidd ID [id number]  Medicaid# [id number] L RX BIN [id number] RXpcn [place] Rx[facility] Prescriber Service Line [phone]   Patient needs PA on insulin , Mounjaro , Dexcom supplies with new insurance

## 2023-09-12 ENCOUNTER — Telehealth: Payer: Self-pay

## 2023-09-12 ENCOUNTER — Other Ambulatory Visit (HOSPITAL_COMMUNITY): Payer: Self-pay

## 2023-09-12 NOTE — Telephone Encounter (Signed)
Pharmacy Patient Advocate Encounter   Received notification from Pt Calls Messages that prior authorization for Donna Kidd is required/requested.   Insurance verification completed.   The patient is insured through [facility] .   Per test claim: PA required; PA submitted to above mentioned insurance via CoverMyMeds Key/confirmation #/EOC [id number] Status is pending

## 2023-09-12 NOTE — Telephone Encounter (Signed)
Pharmacy Patient Advocate Encounter   Received notification from Pt Calls Messages that prior authorization for Donna Kidd is required/requested.   Insurance verification completed.   The patient is insured through [facility] .   Per test claim: PA required; PA submitted to above mentioned insurance via CoverMyMeds Key/confirmation #/EOC [id number] Status is pending

## 2023-09-12 NOTE — Telephone Encounter (Signed)
Pharmacy Patient Advocate Encounter   Received notification from Pt Calls Messages that prior authorization for Dexcom G7 sensor is required/requested.   Insurance verification completed.   The patient is insured through [facility] .   Per test claim: PA required; PA submitted to above mentioned insurance via CoverMyMeds Key/confirmation #/EOC [id number] Status is pending

## 2023-09-17 NOTE — Telephone Encounter (Signed)
Pharmacy Patient Advocate Encounter  Received notification from [facility] that Prior Authorization for Donna Kidd has been APPROVED through 09/11/2024

## 2023-09-17 NOTE — Telephone Encounter (Signed)
Pharmacy Patient Advocate Encounter  Received notification from [facility] that Prior Authorization for Dexcom G7 sensor has been APPROVED through 09/11/2024   PA #/Case ID/Reference #: [id number]

## 2023-09-17 NOTE — Telephone Encounter (Signed)
Pharmacy Patient Advocate Encounter  Received notification from [facility] that Prior Authorization for Greggory Keen has been APPROVED through 09/11/2024   PA #/Case ID/Reference #: [id number]

## 2023-09-28 DIAGNOSIS — Z419 Encounter for procedure for purposes other than remedying health state, unspecified: Secondary | ICD-10-CM | POA: Diagnosis not present

## 2023-10-26 DIAGNOSIS — Z419 Encounter for procedure for purposes other than remedying health state, unspecified: Secondary | ICD-10-CM | POA: Diagnosis not present

## 2023-10-29 ENCOUNTER — Ambulatory Visit (INDEPENDENT_AMBULATORY_CARE_PROVIDER_SITE_OTHER): Payer: BC Managed Care – PPO | Admitting: Family Medicine

## 2023-10-29 ENCOUNTER — Encounter: Payer: Self-pay | Admitting: Family Medicine

## 2023-10-29 ENCOUNTER — Ambulatory Visit
Admission: RE | Admit: 2023-10-29 | Discharge: 2023-10-29 | Disposition: A | Discharge status: Home / Self Care | Source: Ambulatory Visit | Attending: Family Medicine

## 2023-10-29 VITALS — BP 130/60 | HR 82 | Temp 97.7°F | Ht 68.0 in | Wt 257.6 lb

## 2023-10-29 DIAGNOSIS — Z1211 Encounter for screening for malignant neoplasm of colon: Secondary | ICD-10-CM | POA: Diagnosis not present

## 2023-10-29 DIAGNOSIS — L97511 Non-pressure chronic ulcer of other part of right foot limited to breakdown of skin: Secondary | ICD-10-CM | POA: Diagnosis not present

## 2023-10-29 DIAGNOSIS — E11621 Type 2 diabetes mellitus with foot ulcer: Secondary | ICD-10-CM | POA: Insufficient documentation

## 2023-10-29 DIAGNOSIS — Z7689 Persons encountering health services in other specified circumstances: Secondary | ICD-10-CM | POA: Insufficient documentation

## 2023-10-29 DIAGNOSIS — E1142 Type 2 diabetes mellitus with diabetic polyneuropathy: Secondary | ICD-10-CM

## 2023-10-29 DIAGNOSIS — E785 Hyperlipidemia, unspecified: Secondary | ICD-10-CM

## 2023-10-29 DIAGNOSIS — Z13220 Encounter for screening for lipoid disorders: Secondary | ICD-10-CM | POA: Diagnosis not present

## 2023-10-29 DIAGNOSIS — Z1159 Encounter for screening for other viral diseases: Secondary | ICD-10-CM | POA: Diagnosis not present

## 2023-10-29 DIAGNOSIS — Z114 Encounter for screening for human immunodeficiency virus [HIV]: Secondary | ICD-10-CM

## 2023-10-29 DIAGNOSIS — Z794 Long term (current) use of insulin: Secondary | ICD-10-CM

## 2023-10-29 DIAGNOSIS — L97519 Non-pressure chronic ulcer of other part of right foot with unspecified severity: Secondary | ICD-10-CM | POA: Diagnosis not present

## 2023-10-29 DIAGNOSIS — E1169 Type 2 diabetes mellitus with other specified complication: Secondary | ICD-10-CM | POA: Diagnosis not present

## 2023-10-29 DIAGNOSIS — Z1231 Encounter for screening mammogram for malignant neoplasm of breast: Secondary | ICD-10-CM

## 2023-10-29 MED ORDER — SULFAMETHOXAZOLE-TRIMETHOPRIM 800-160 MG PO TABS: 1.0000 | ORAL_TABLET | Freq: Two times a day (BID) | ORAL | 0 refills | Status: DC

## 2023-10-29 NOTE — Assessment & Plan Note (Signed)
 Counseled on importance of weight management for overall health. Encouraged low calorie, heart healthy diet and moderate intensity exercise 150 minutes weekly. This is 3-5 times weekly for 30-50 minutes each session. Goal should be pace of 3 miles/hours, or walking 1.5 miles in 30 minutes and include strength training. Discussed risks of obesity. Currently taking Mounjaro 7.5mg  weekly. Working on increasing physical activity since amputation.

## 2023-10-29 NOTE — Patient Instructions (Signed)
 It was great to meet you today and I'm excited to have you join the [facility] practice. I hope you had a positive experience today! If you feel so inclined, please feel free to recommend our practice to friends and family. Kurtis Bushman, FNP-C

## 2023-10-29 NOTE — Assessment & Plan Note (Signed)
 Followed by Endo, well controlled. Would not like A1c today as she sees Endo this month. A1c and uACR ordered. Foot exam today. Vaccines utd. Retinal eye exam utd. Recommend heart healthy diet such as Mediterranean diet with whole grains, fruits, vegetable, fish, lean meats, nuts, and olive oil. Limit salt. Encouraged moderate walking, 3-5 times/week for 30-50 minutes each session. Aim for at least 150 minutes.week. Goal should be pace of 3 miles/hours, or walking 1.5 miles in 30 minutes. Seek medical care for urinary frequency, extreme thirst, vision changes, lightheadedness, dizziness.

## 2023-10-29 NOTE — Assessment & Plan Note (Signed)
 3cm x 3cm ulcer to right 1st metatarsal head with surrounding erythema. Xray ordered to evaluate for osteomyelitis. Start Bactrim DS BID x10 days. Recommended follow up with podiatry. Recommended pressure relief at all times.

## 2023-10-29 NOTE — Assessment & Plan Note (Signed)
 Today we reviewed your medical history and health maintenance items as well as current concerns. Order placed for colonoscopy and mammogram. Please return to my office for CPE and PAP with labs week prior.
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
## 2023-10-29 NOTE — Assessment & Plan Note (Signed)
 Goal less than 70. Fasting labs ordered.

## 2023-10-31 DIAGNOSIS — E11610 Type 2 diabetes mellitus with diabetic neuropathic arthropathy: Secondary | ICD-10-CM | POA: Diagnosis not present

## 2023-10-31 DIAGNOSIS — M25511 Pain in right shoulder: Secondary | ICD-10-CM | POA: Diagnosis not present

## 2023-10-31 DIAGNOSIS — Z5181 Encounter for therapeutic drug level monitoring: Secondary | ICD-10-CM | POA: Diagnosis not present

## 2023-10-31 DIAGNOSIS — M79672 Pain in left foot: Secondary | ICD-10-CM | POA: Diagnosis not present

## 2023-10-31 DIAGNOSIS — Z79899 Other long term (current) drug therapy: Secondary | ICD-10-CM | POA: Diagnosis not present

## 2023-10-31 DIAGNOSIS — M549 Dorsalgia, unspecified: Secondary | ICD-10-CM | POA: Diagnosis not present

## 2023-10-31 DIAGNOSIS — G894 Chronic pain syndrome: Secondary | ICD-10-CM | POA: Diagnosis not present

## 2023-12-07 DIAGNOSIS — Z419 Encounter for procedure for purposes other than remedying health state, unspecified: Secondary | ICD-10-CM | POA: Diagnosis not present

## 2023-12-13 ENCOUNTER — Other Ambulatory Visit

## 2023-12-16 ENCOUNTER — Encounter: Admitting: Family Medicine

## 2023-12-19 ENCOUNTER — Other Ambulatory Visit

## 2023-12-19 DIAGNOSIS — Z794 Long term (current) use of insulin: Secondary | ICD-10-CM | POA: Diagnosis not present

## 2023-12-19 DIAGNOSIS — Z114 Encounter for screening for human immunodeficiency virus [HIV]: Secondary | ICD-10-CM | POA: Diagnosis not present

## 2023-12-19 DIAGNOSIS — E1142 Type 2 diabetes mellitus with diabetic polyneuropathy: Secondary | ICD-10-CM | POA: Diagnosis not present

## 2023-12-19 DIAGNOSIS — E785 Hyperlipidemia, unspecified: Secondary | ICD-10-CM | POA: Diagnosis not present

## 2023-12-19 DIAGNOSIS — Z1159 Encounter for screening for other viral diseases: Secondary | ICD-10-CM | POA: Diagnosis not present

## 2023-12-19 DIAGNOSIS — Z13220 Encounter for screening for lipoid disorders: Secondary | ICD-10-CM | POA: Diagnosis not present

## 2023-12-19 DIAGNOSIS — E1169 Type 2 diabetes mellitus with other specified complication: Secondary | ICD-10-CM | POA: Diagnosis not present

## 2023-12-25 LAB — CBC WITH DIFFERENTIAL/PLATELET
Absolute Lymphocytes: 2946 {cells}/uL (ref 850–3900)
Absolute Monocytes: 454 {cells}/uL (ref 200–950)
Basophils Absolute: 80 {cells}/uL (ref 0–200)
Basophils Relative: 0.9 %
Eosinophils Absolute: 160 {cells}/uL (ref 15–500)
Eosinophils Relative: 1.8 %
HCT: 34.2 % — ABNORMAL LOW (ref 35.0–45.0)
Hemoglobin: 10.6 g/dL — ABNORMAL LOW (ref 11.7–15.5)
MCH: 26.3 pg — ABNORMAL LOW (ref 27.0–33.0)
MCHC: 31 g/dL — ABNORMAL LOW (ref 32.0–36.0)
MCV: 84.9 fL (ref 80.0–100.0)
MPV: 11.6 fL (ref 7.5–12.5)
Monocytes Relative: 5.1 %
Neutro Abs: 5260 {cells}/uL (ref 1500–7800)
Neutrophils Relative %: 59.1 %
Platelets: 316 10*3/uL (ref 140–400)
RBC: 4.03 10*6/uL (ref 3.80–5.10)
RDW: 15.1 % — ABNORMAL HIGH (ref 11.0–15.0)
Total Lymphocyte: 33.1 %
WBC: 8.9 10*3/uL (ref 3.8–10.8)

## 2023-12-25 LAB — HIV ANTIBODY (ROUTINE TESTING W REFLEX): HIV 1&2 Ab, 4th Generation: NONREACTIVE

## 2023-12-25 LAB — COMPLETE METABOLIC PANEL WITHOUT GFR
AG Ratio: 1.7 (calc) (ref 1.0–2.5)
ALT: 17 U/L (ref 6–29)
AST: 20 U/L (ref 10–35)
Albumin: 4 g/dL (ref 3.6–5.1)
Alkaline phosphatase (APISO): 133 U/L (ref 37–153)
BUN: 12 mg/dL (ref 7–25)
CO2: 22 mmol/L (ref 20–32)
Calcium: 9.1 mg/dL (ref 8.6–10.4)
Chloride: 103 mmol/L (ref 98–110)
Creat: 0.84 mg/dL (ref 0.50–1.03)
Globulin: 2.3 g/dL (ref 1.9–3.7)
Glucose, Bld: 176 mg/dL — ABNORMAL HIGH (ref 65–99)
Potassium: 4.9 mmol/L (ref 3.5–5.3)
Sodium: 139 mmol/L (ref 135–146)
Total Bilirubin: 0.3 mg/dL (ref 0.2–1.2)
Total Protein: 6.3 g/dL (ref 6.1–8.1)

## 2023-12-25 LAB — IRON,TIBC AND FERRITIN PANEL
%SAT: 7 % — ABNORMAL LOW (ref 16–45)
Ferritin: 10 ng/mL — ABNORMAL LOW (ref 16–232)
Iron: 31 ug/dL — ABNORMAL LOW (ref 45–160)
TIBC: 449 ug/dL (ref 250–450)

## 2023-12-25 LAB — LIPID PANEL
Cholesterol: 190 mg/dL (ref <200)
HDL: 45 mg/dL — ABNORMAL LOW (ref >=50)
LDL Cholesterol (Calc): 109 mg/dL — ABNORMAL HIGH
Non-HDL Cholesterol (Calc): 145 mg/dL — ABNORMAL HIGH (ref <130)
Total CHOL/HDL Ratio: 4.2 (calc) (ref <5.0)
Triglycerides: 238 mg/dL — ABNORMAL HIGH (ref <150)

## 2023-12-25 LAB — TSH: TSH: 2.08 m[IU]/L (ref 0.40–4.50)

## 2023-12-25 LAB — TEST AUTHORIZATION

## 2023-12-25 LAB — B12 AND FOLATE PANEL
Folate: 9.8 ng/mL
Vitamin B-12: 208 pg/mL (ref 200–1100)

## 2023-12-25 LAB — MICROALBUMIN / CREATININE URINE RATIO
Creatinine, Urine: 42 mg/dL (ref 20–275)
Microalb Creat Ratio: 36 mg/g{creat} — ABNORMAL HIGH (ref <30)
Microalb, Ur: 1.5 mg/dL

## 2023-12-25 LAB — HEMOGLOBIN A1C
Hgb A1c MFr Bld: 7.7 % — ABNORMAL HIGH (ref <5.7)
Mean Plasma Glucose: 174 mg/dL
eAG (mmol/L): 9.7 mmol/L

## 2023-12-25 LAB — HEPATITIS C ANTIBODY: Hepatitis C Ab: NONREACTIVE

## 2023-12-26 ENCOUNTER — Encounter: Payer: Self-pay | Admitting: Family Medicine

## 2023-12-26 ENCOUNTER — Ambulatory Visit: Admitting: Family Medicine

## 2023-12-26 VITALS — BP 130/80 | HR 84 | Ht 68.0 in | Wt 223.0 lb

## 2023-12-26 DIAGNOSIS — Z1231 Encounter for screening mammogram for malignant neoplasm of breast: Secondary | ICD-10-CM

## 2023-12-26 DIAGNOSIS — K9189 Other postprocedural complications and disorders of digestive system: Secondary | ICD-10-CM | POA: Diagnosis not present

## 2023-12-26 DIAGNOSIS — Z23 Encounter for immunization: Secondary | ICD-10-CM

## 2023-12-26 DIAGNOSIS — N182 Chronic kidney disease, stage 2 (mild): Secondary | ICD-10-CM | POA: Diagnosis not present

## 2023-12-26 DIAGNOSIS — E349 Endocrine disorder, unspecified: Secondary | ICD-10-CM | POA: Diagnosis not present

## 2023-12-26 DIAGNOSIS — Z0000 Encounter for general adult medical examination without abnormal findings: Secondary | ICD-10-CM | POA: Insufficient documentation

## 2023-12-26 DIAGNOSIS — F339 Major depressive disorder, recurrent, unspecified: Secondary | ICD-10-CM

## 2023-12-26 DIAGNOSIS — Z794 Long term (current) use of insulin: Secondary | ICD-10-CM

## 2023-12-26 DIAGNOSIS — E1122 Type 2 diabetes mellitus with diabetic chronic kidney disease: Secondary | ICD-10-CM | POA: Diagnosis not present

## 2023-12-26 DIAGNOSIS — E1142 Type 2 diabetes mellitus with diabetic polyneuropathy: Secondary | ICD-10-CM

## 2023-12-26 DIAGNOSIS — I152 Hypertension secondary to endocrine disorders: Secondary | ICD-10-CM | POA: Diagnosis not present

## 2023-12-26 DIAGNOSIS — E11621 Type 2 diabetes mellitus with foot ulcer: Secondary | ICD-10-CM

## 2023-12-26 DIAGNOSIS — L97511 Non-pressure chronic ulcer of other part of right foot limited to breakdown of skin: Secondary | ICD-10-CM | POA: Diagnosis not present

## 2023-12-26 DIAGNOSIS — Z0001 Encounter for general adult medical examination with abnormal findings: Secondary | ICD-10-CM

## 2023-12-26 DIAGNOSIS — Z1211 Encounter for screening for malignant neoplasm of colon: Secondary | ICD-10-CM

## 2023-12-26 MED ORDER — IRON (FERROUS SULFATE) 325 (65 FE) MG PO TABS: 325.0000 mg | ORAL_TABLET | ORAL | 0 refills | Status: DC

## 2023-12-26 MED ORDER — ROSUVASTATIN CALCIUM 10 MG PO TABS: 10.0000 mg | ORAL_TABLET | Freq: Every day | ORAL | 3 refills | Status: DC

## 2023-12-26 MED ORDER — BUPROPION HCL ER (XL) 150 MG PO TB24: 150.0000 mg | ORAL_TABLET | Freq: Every day | ORAL | 1 refills | Status: DC

## 2023-12-26 MED ORDER — LISINOPRIL 5 MG PO TABS: 5.0000 mg | ORAL_TABLET | Freq: Every day | ORAL | 3 refills | Status: DC

## 2023-12-26 NOTE — Assessment & Plan Note (Signed)
 Start lisinopril  5mg  daily. Recommend heart healthy diet such as Mediterranean diet with whole grains, fruits, vegetable, fish, lean meats, nuts, and olive oil. Limit salt. Encouraged moderate walking, 3-5 times/week for 30-50 minutes each session. Aim for at least 150 minutes.week. Goal should be pace of 3 miles/hours, or walking 1.5 miles in 30 minutes. Avoid tobacco products. Avoid excess alcohol. Take medications as prescribed and bring medications and blood pressure log with cuff to each office visit. Seek medical care for chest pain, palpitations, shortness of breath with exertion, dizziness/lightheadedness, vision changes, recurrent headaches, or swelling of extremities. Follow up in 4 weeks

## 2023-12-26 NOTE — Assessment & Plan Note (Signed)

## 2023-12-26 NOTE — Assessment & Plan Note (Signed)
 Restart ferrous sulfate  325mg  every other day. Recheck in 4 weeks.

## 2023-12-26 NOTE — Assessment & Plan Note (Signed)
 Counseled on importance of weight management for overall health. Encouraged low calorie, heart healthy diet and moderate intensity exercise 150 minutes weekly. This is 3-5 times weekly for 30-50 minutes each session. Goal should be pace of 3 miles/hours, or walking 1.5 miles in 30 minutes and include strength training. Discussed risks of obesity. Currently taking Mounjaro 7.5mg  weekly. Working on increasing physical activity since amputation.
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
## 2023-12-26 NOTE — Assessment & Plan Note (Signed)
 Followed by Endo, A1c 7.7%. A1c and uACR ordered. Foot exam today. Vaccines utd. Retinal eye exam utd. Recommend heart healthy diet such as Mediterranean diet with whole grains, fruits, vegetable, fish, lean meats, nuts, and olive oil. Limit salt. Encouraged moderate walking, 3-5 times/week for 30-50 minutes each session. Aim for at least 150 minutes.week. Goal should be pace of 3 miles/hours, or walking 1.5 miles in 30 minutes. Seek medical care for urinary frequency, extreme thirst, vision changes, lightheadedness, dizziness.

## 2023-12-26 NOTE — Assessment & Plan Note (Signed)
 Restart Wellbutrin  XL 150mg  daily, may increase to 300mg  daily after at least 4 days. Encouraged adequate sleep, good nutrition, and exercise as tolerated. Follow up in 4 weeks.

## 2023-12-26 NOTE — Assessment & Plan Note (Signed)
 Well healing. Referral back to podiatry for monitoring.

## 2023-12-27 ENCOUNTER — Other Ambulatory Visit: Payer: Self-pay | Admitting: Internal Medicine

## 2023-12-31 ENCOUNTER — Ambulatory Visit
Admission: RE | Admit: 2023-12-31 | Discharge: 2023-12-31 | Disposition: A | Discharge status: Home / Self Care | Source: Ambulatory Visit | Attending: Family Medicine | Admitting: Family Medicine

## 2023-12-31 DIAGNOSIS — Z1231 Encounter for screening mammogram for malignant neoplasm of breast: Secondary | ICD-10-CM

## 2023-12-31 DIAGNOSIS — Z0000 Encounter for general adult medical examination without abnormal findings: Secondary | ICD-10-CM

## 2024-01-06 DIAGNOSIS — Z419 Encounter for procedure for purposes other than remedying health state, unspecified: Secondary | ICD-10-CM | POA: Diagnosis not present

## 2024-01-20 ENCOUNTER — Other Ambulatory Visit: Payer: Self-pay | Admitting: Internal Medicine

## 2024-01-21 ENCOUNTER — Other Ambulatory Visit

## 2024-01-27 ENCOUNTER — Other Ambulatory Visit

## 2024-01-27 ENCOUNTER — Ambulatory Visit: Admitting: Family Medicine

## 2024-01-30 DIAGNOSIS — Z5181 Encounter for therapeutic drug level monitoring: Secondary | ICD-10-CM | POA: Diagnosis not present

## 2024-01-30 DIAGNOSIS — G894 Chronic pain syndrome: Secondary | ICD-10-CM | POA: Diagnosis not present

## 2024-01-30 DIAGNOSIS — E11610 Type 2 diabetes mellitus with diabetic neuropathic arthropathy: Secondary | ICD-10-CM | POA: Diagnosis not present

## 2024-01-30 DIAGNOSIS — Z79899 Other long term (current) drug therapy: Secondary | ICD-10-CM | POA: Diagnosis not present

## 2024-01-30 DIAGNOSIS — M25511 Pain in right shoulder: Secondary | ICD-10-CM | POA: Diagnosis not present

## 2024-01-30 DIAGNOSIS — M549 Dorsalgia, unspecified: Secondary | ICD-10-CM | POA: Diagnosis not present

## 2024-01-30 DIAGNOSIS — M79672 Pain in left foot: Secondary | ICD-10-CM | POA: Diagnosis not present

## 2024-01-31 DIAGNOSIS — Z89512 Acquired absence of left leg below knee: Secondary | ICD-10-CM | POA: Diagnosis not present

## 2024-02-06 DIAGNOSIS — Z419 Encounter for procedure for purposes other than remedying health state, unspecified: Secondary | ICD-10-CM | POA: Diagnosis not present

## 2024-03-01 ENCOUNTER — Emergency Department (HOSPITAL_COMMUNITY)
Admission: EM | Admit: 2024-03-01 | Discharge: 2024-03-01 | Disposition: A | Discharge status: Home / Self Care | Attending: Emergency Medicine | Admitting: Emergency Medicine

## 2024-03-01 ENCOUNTER — Encounter (HOSPITAL_COMMUNITY): Payer: Self-pay | Admitting: Pharmacy Technician

## 2024-03-01 ENCOUNTER — Emergency Department (HOSPITAL_COMMUNITY)

## 2024-03-01 ENCOUNTER — Other Ambulatory Visit: Payer: Self-pay

## 2024-03-01 DIAGNOSIS — M545 Low back pain, unspecified: Secondary | ICD-10-CM | POA: Diagnosis not present

## 2024-03-01 DIAGNOSIS — M542 Cervicalgia: Secondary | ICD-10-CM | POA: Insufficient documentation

## 2024-03-01 DIAGNOSIS — R519 Headache, unspecified: Secondary | ICD-10-CM | POA: Insufficient documentation

## 2024-03-01 DIAGNOSIS — S3991XA Unspecified injury of abdomen, initial encounter: Secondary | ICD-10-CM | POA: Diagnosis not present

## 2024-03-01 DIAGNOSIS — E11610 Type 2 diabetes mellitus with diabetic neuropathic arthropathy: Secondary | ICD-10-CM | POA: Insufficient documentation

## 2024-03-01 DIAGNOSIS — R0902 Hypoxemia: Secondary | ICD-10-CM | POA: Diagnosis not present

## 2024-03-01 DIAGNOSIS — S3993XA Unspecified injury of pelvis, initial encounter: Secondary | ICD-10-CM | POA: Diagnosis not present

## 2024-03-01 DIAGNOSIS — Z794 Long term (current) use of insulin: Secondary | ICD-10-CM | POA: Insufficient documentation

## 2024-03-01 DIAGNOSIS — S299XXA Unspecified injury of thorax, initial encounter: Secondary | ICD-10-CM | POA: Diagnosis not present

## 2024-03-01 DIAGNOSIS — M25512 Pain in left shoulder: Secondary | ICD-10-CM | POA: Diagnosis not present

## 2024-03-01 DIAGNOSIS — Y92410 Unspecified street and highway as the place of occurrence of the external cause: Secondary | ICD-10-CM | POA: Insufficient documentation

## 2024-03-01 DIAGNOSIS — M25551 Pain in right hip: Secondary | ICD-10-CM | POA: Insufficient documentation

## 2024-03-01 DIAGNOSIS — R9431 Abnormal electrocardiogram [ECG] [EKG]: Secondary | ICD-10-CM | POA: Diagnosis not present

## 2024-03-01 DIAGNOSIS — M79651 Pain in right thigh: Secondary | ICD-10-CM | POA: Insufficient documentation

## 2024-03-01 DIAGNOSIS — S39012A Strain of muscle, fascia and tendon of lower back, initial encounter: Secondary | ICD-10-CM | POA: Diagnosis not present

## 2024-03-01 DIAGNOSIS — M79603 Pain in arm, unspecified: Secondary | ICD-10-CM | POA: Diagnosis not present

## 2024-03-01 DIAGNOSIS — S199XXA Unspecified injury of neck, initial encounter: Secondary | ICD-10-CM | POA: Diagnosis not present

## 2024-03-01 DIAGNOSIS — S76911A Strain of unspecified muscles, fascia and tendons at thigh level, right thigh, initial encounter: Secondary | ICD-10-CM | POA: Diagnosis not present

## 2024-03-01 DIAGNOSIS — S161XXA Strain of muscle, fascia and tendon at neck level, initial encounter: Secondary | ICD-10-CM

## 2024-03-01 DIAGNOSIS — R0689 Other abnormalities of breathing: Secondary | ICD-10-CM | POA: Diagnosis not present

## 2024-03-01 DIAGNOSIS — S0990XA Unspecified injury of head, initial encounter: Secondary | ICD-10-CM | POA: Diagnosis not present

## 2024-03-01 DIAGNOSIS — S4992XA Unspecified injury of left shoulder and upper arm, initial encounter: Secondary | ICD-10-CM | POA: Diagnosis not present

## 2024-03-01 DIAGNOSIS — S79921A Unspecified injury of right thigh, initial encounter: Secondary | ICD-10-CM | POA: Diagnosis not present

## 2024-03-01 DIAGNOSIS — R9389 Abnormal findings on diagnostic imaging of other specified body structures: Secondary | ICD-10-CM | POA: Diagnosis not present

## 2024-03-01 DIAGNOSIS — Z743 Need for continuous supervision: Secondary | ICD-10-CM | POA: Diagnosis not present

## 2024-03-01 DIAGNOSIS — I672 Cerebral atherosclerosis: Secondary | ICD-10-CM | POA: Diagnosis not present

## 2024-03-01 LAB — ETHANOL: Alcohol, Ethyl (B): <15 mg/dL (ref <15)

## 2024-03-01 LAB — CBC
HCT: 40.7 % (ref 36.0–46.0)
Hemoglobin: 12.6 g/dL (ref 12.0–15.0)
MCH: 26.9 pg (ref 26.0–34.0)
MCHC: 31 g/dL (ref 30.0–36.0)
MCV: 86.8 fL (ref 80.0–100.0)
Platelets: 303 K/uL (ref 150–400)
RBC: 4.69 MIL/uL (ref 3.87–5.11)
RDW: 16 % — ABNORMAL HIGH (ref 11.5–15.5)
WBC: 8.6 K/uL (ref 4.0–10.5)
nRBC: 0 % (ref 0.0–0.2)

## 2024-03-01 LAB — COMPREHENSIVE METABOLIC PANEL WITH GFR
ALT: 32 U/L (ref 0–44)
AST: 36 U/L (ref 15–41)
Albumin: 3.6 g/dL (ref 3.5–5.0)
Alkaline Phosphatase: 113 U/L (ref 38–126)
Anion gap: 12 (ref 5–15)
BUN: 13 mg/dL (ref 6–20)
CO2: 23 mmol/L (ref 22–32)
Calcium: 9.1 mg/dL (ref 8.9–10.3)
Chloride: 104 mmol/L (ref 98–111)
Creatinine, Ser: 0.85 mg/dL (ref 0.44–1.00)
GFR, Estimated: >60 mL/min (ref >60)
Glucose, Bld: 141 mg/dL — ABNORMAL HIGH (ref 70–99)
Potassium: 4.8 mmol/L (ref 3.5–5.1)
Sodium: 139 mmol/L (ref 135–145)
Total Bilirubin: 0.6 mg/dL (ref 0.0–1.2)
Total Protein: 7.4 g/dL (ref 6.5–8.1)

## 2024-03-01 LAB — URINALYSIS, ROUTINE W REFLEX MICROSCOPIC
Bilirubin Urine: NEGATIVE
Glucose, UA: NEGATIVE mg/dL
Hgb urine dipstick: NEGATIVE
Ketones, ur: NEGATIVE mg/dL
Leukocytes,Ua: NEGATIVE
Nitrite: NEGATIVE
Protein, ur: NEGATIVE mg/dL
Specific Gravity, Urine: 1.004 — ABNORMAL LOW (ref 1.005–1.030)
pH: 7 (ref 5.0–8.0)

## 2024-03-01 LAB — I-STAT CHEM 8, ED
BUN: 14 mg/dL (ref 6–20)
Calcium, Ion: 1.08 mmol/L — ABNORMAL LOW (ref 1.15–1.40)
Chloride: 106 mmol/L (ref 98–111)
Creatinine, Ser: 0.8 mg/dL (ref 0.44–1.00)
Glucose, Bld: 138 mg/dL — ABNORMAL HIGH (ref 70–99)
HCT: 39 % (ref 36.0–46.0)
Hemoglobin: 13.3 g/dL (ref 12.0–15.0)
Potassium: 4.8 mmol/L (ref 3.5–5.1)
Sodium: 140 mmol/L (ref 135–145)
TCO2: 25 mmol/L (ref 22–32)

## 2024-03-01 LAB — SAMPLE TO BLOOD BANK

## 2024-03-01 LAB — PROTIME-INR
INR: 0.9 (ref 0.8–1.2)
Prothrombin Time: 12.8 s (ref 11.4–15.2)

## 2024-03-01 LAB — I-STAT CG4 LACTIC ACID, ED: Lactic Acid, Venous: 3.4 mmol/L (ref 0.5–1.9)

## 2024-03-01 MED ORDER — ONDANSETRON HCL 4 MG/2ML IJ SOLN
4.0000 mg | Freq: Once | INTRAMUSCULAR | Status: AC
  Administered 2024-03-01: 4 mg via INTRAVENOUS
  Filled 2024-03-01: qty 2

## 2024-03-01 MED ORDER — METHOCARBAMOL 500 MG PO TABS: 500.0000 mg | ORAL_TABLET | Freq: Two times a day (BID) | ORAL | 0 refills | Status: DC

## 2024-03-01 MED ORDER — MORPHINE SULFATE (PF) 4 MG/ML IV SOLN
4.0000 mg | Freq: Once | INTRAVENOUS | Status: AC
  Administered 2024-03-01: 4 mg via INTRAVENOUS
  Filled 2024-03-01: qty 1

## 2024-03-01 MED ORDER — SODIUM CHLORIDE 0.9% FLUSH: 3.0000 mL | INTRAVENOUS | Status: DC | PRN

## 2024-03-01 MED ORDER — ACETAMINOPHEN 500 MG PO TABS
1000.0000 mg | ORAL_TABLET | Freq: Once | ORAL | Status: AC
  Administered 2024-03-01: 1000 mg via ORAL
  Filled 2024-03-01: qty 2

## 2024-03-01 MED ORDER — IOHEXOL 350 MG/ML SOLN
100.0000 mL | Freq: Once | INTRAVENOUS | Status: AC | PRN
  Administered 2024-03-01: 100 mL via INTRAVENOUS

## 2024-03-01 MED ORDER — SODIUM CHLORIDE 0.9% FLUSH: 3.0000 mL | Freq: Two times a day (BID) | INTRAVENOUS | Status: DC

## 2024-03-01 NOTE — ED Notes (Signed)
 Pt able to get up to the bedside commode without issue.

## 2024-03-01 NOTE — ED Notes (Signed)
 Pt requesting tylenol  for headache. PA notified.
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
## 2024-03-02 ENCOUNTER — Other Ambulatory Visit: Payer: Self-pay | Admitting: Internal Medicine

## 2024-03-03 ENCOUNTER — Telehealth: Payer: Self-pay

## 2024-03-03 NOTE — Telephone Encounter (Signed)
Contact patient to schedule follow up

## 2024-03-07 DIAGNOSIS — Z419 Encounter for procedure for purposes other than remedying health state, unspecified: Secondary | ICD-10-CM | POA: Diagnosis not present

## 2024-03-09 ENCOUNTER — Other Ambulatory Visit

## 2024-03-09 DIAGNOSIS — E349 Endocrine disorder, unspecified: Secondary | ICD-10-CM | POA: Diagnosis not present

## 2024-03-09 DIAGNOSIS — D508 Other iron deficiency anemias: Secondary | ICD-10-CM

## 2024-03-09 DIAGNOSIS — Z794 Long term (current) use of insulin: Secondary | ICD-10-CM

## 2024-03-09 DIAGNOSIS — E1169 Type 2 diabetes mellitus with other specified complication: Secondary | ICD-10-CM

## 2024-03-09 DIAGNOSIS — I152 Hypertension secondary to endocrine disorders: Secondary | ICD-10-CM | POA: Diagnosis not present

## 2024-03-09 DIAGNOSIS — K9189 Other postprocedural complications and disorders of digestive system: Secondary | ICD-10-CM | POA: Diagnosis not present

## 2024-03-09 DIAGNOSIS — E1142 Type 2 diabetes mellitus with diabetic polyneuropathy: Secondary | ICD-10-CM | POA: Diagnosis not present

## 2024-03-09 DIAGNOSIS — E785 Hyperlipidemia, unspecified: Secondary | ICD-10-CM | POA: Diagnosis not present

## 2024-03-09 LAB — CBC WITH DIFFERENTIAL/PLATELET
Absolute Lymphocytes: 2714 {cells}/uL (ref 850–3900)
Absolute Monocytes: 446 {cells}/uL (ref 200–950)
Basophils Absolute: 57 {cells}/uL (ref 0–200)
Basophils Relative: 0.7 %
Eosinophils Absolute: 130 {cells}/uL (ref 15–500)
Eosinophils Relative: 1.6 %
HCT: 39.4 % (ref 35.0–45.0)
Hemoglobin: 12.3 g/dL (ref 11.7–15.5)
MCH: 27.2 pg (ref 27.0–33.0)
MCHC: 31.2 g/dL — ABNORMAL LOW (ref 32.0–36.0)
MCV: 87 fL (ref 80.0–100.0)
MPV: 11.6 fL (ref 7.5–12.5)
Monocytes Relative: 5.5 %
Neutro Abs: 4755 {cells}/uL (ref 1500–7800)
Neutrophils Relative %: 58.7 %
Platelets: 347 Thousand/uL (ref 140–400)
RBC: 4.53 Million/uL (ref 3.80–5.10)
RDW: 15.8 % — ABNORMAL HIGH (ref 11.0–15.0)
Total Lymphocyte: 33.5 %
WBC: 8.1 Thousand/uL (ref 3.8–10.8)

## 2024-03-09 LAB — COMPREHENSIVE METABOLIC PANEL WITH GFR
AG Ratio: 1.4 (calc) (ref 1.0–2.5)
ALT: 31 U/L — ABNORMAL HIGH (ref 6–29)
AST: 31 U/L (ref 10–35)
Albumin: 4.1 g/dL (ref 3.6–5.1)
Alkaline phosphatase (APISO): 137 U/L (ref 37–153)
BUN/Creatinine Ratio: 10 (calc) (ref 6–22)
BUN: 11 mg/dL (ref 7–25)
CO2: 24 mmol/L (ref 20–32)
Calcium: 9.6 mg/dL (ref 8.6–10.4)
Chloride: 102 mmol/L (ref 98–110)
Creat: 1.12 mg/dL — ABNORMAL HIGH (ref 0.50–1.03)
Globulin: 3 g/dL (ref 1.9–3.7)
Glucose, Bld: 193 mg/dL — ABNORMAL HIGH (ref 65–99)
Potassium: 5 mmol/L (ref 3.5–5.3)
Sodium: 139 mmol/L (ref 135–146)
Total Bilirubin: 0.4 mg/dL (ref 0.2–1.2)
Total Protein: 7.1 g/dL (ref 6.1–8.1)
eGFR: 57 mL/min/1.73m2 — ABNORMAL LOW (ref >=60)

## 2024-03-09 LAB — LIPID PANEL
Cholesterol: 194 mg/dL (ref <200)
HDL: 45 mg/dL — ABNORMAL LOW (ref >=50)
LDL Cholesterol (Calc): 107 mg/dL — ABNORMAL HIGH
Non-HDL Cholesterol (Calc): 149 mg/dL — ABNORMAL HIGH (ref <130)
Total CHOL/HDL Ratio: 4.3 (calc) (ref <5.0)
Triglycerides: 292 mg/dL — ABNORMAL HIGH (ref <150)

## 2024-03-16 ENCOUNTER — Ambulatory Visit: Admitting: Family Medicine

## 2024-03-16 ENCOUNTER — Encounter: Payer: Self-pay | Admitting: Family Medicine

## 2024-03-16 VITALS — BP 132/75 | HR 83 | Temp 97.6°F | Ht 68.0 in | Wt 262.8 lb

## 2024-03-16 DIAGNOSIS — Z23 Encounter for immunization: Secondary | ICD-10-CM | POA: Diagnosis not present

## 2024-03-16 DIAGNOSIS — Z794 Long term (current) use of insulin: Secondary | ICD-10-CM

## 2024-03-16 DIAGNOSIS — E1165 Type 2 diabetes mellitus with hyperglycemia: Secondary | ICD-10-CM

## 2024-03-16 DIAGNOSIS — E349 Endocrine disorder, unspecified: Secondary | ICD-10-CM

## 2024-03-16 DIAGNOSIS — E785 Hyperlipidemia, unspecified: Secondary | ICD-10-CM

## 2024-03-16 DIAGNOSIS — I152 Hypertension secondary to endocrine disorders: Secondary | ICD-10-CM | POA: Diagnosis not present

## 2024-03-16 MED ORDER — NOVOLOG FLEXPEN 100 UNIT/ML ~~LOC~~ SOPN: PEN_INJECTOR | SUBCUTANEOUS | 0 refills | Status: DC

## 2024-03-16 MED ORDER — DEXCOM G7 SENSOR MISC: 1.0000 | 3 refills | Status: DC

## 2024-03-16 MED ORDER — LISINOPRIL 10 MG PO TABS: 10.0000 mg | ORAL_TABLET | Freq: Every day | ORAL | 1 refills | Status: AC

## 2024-03-16 MED ORDER — ROSUVASTATIN CALCIUM 20 MG PO TABS: 20.0000 mg | ORAL_TABLET | Freq: Every day | ORAL | 1 refills | Status: DC

## 2024-03-16 NOTE — Assessment & Plan Note (Signed)
 Not at goal, increase Rosuvastatin  to 20mg  daily and recheck CMP and lipids in 6 weeks. I recommend consuming a heart healthy diet such as Mediterranean diet or DASH diet with whole grains, fruits, vegetable, fish, lean meats, nuts, and olive oil. Limit sweets and processed foods. I also encourage moderate intensity exercise 150 minutes weekly. This is 3-5 times weekly for 30-50 minutes each session. Goal should be pace of 3 miles/hours, or walking 1.5 miles in 30 minutes. The 10-year ASCVD risk score (Arnett DK, et al., 2019) is: 8.3%

## 2024-03-16 NOTE — Assessment & Plan Note (Signed)
 Increase lisinopril  to 10mg  daily. Recommend heart healthy diet such as Mediterranean diet with whole grains, fruits, vegetable, fish, lean meats, nuts, and olive oil. Limit salt. Encouraged moderate walking, 3-5 times/week for 30-50 minutes each session. Aim for at least 150 minutes.week. Goal should be pace of 3 miles/hours, or walking 1.5 miles in 30 minutes. Avoid tobacco products. Avoid excess alcohol. Take medications as prescribed and bring medications and blood pressure log with cuff to each office visit. Seek medical care for chest pain, palpitations, shortness of breath with exertion, dizziness/lightheadedness, vision changes, recurrent headaches, or swelling of extremities. Follow up in 4 weeks
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
## 2024-03-22 ENCOUNTER — Other Ambulatory Visit: Payer: Self-pay | Admitting: Family Medicine

## 2024-04-07 DIAGNOSIS — Z419 Encounter for procedure for purposes other than remedying health state, unspecified: Secondary | ICD-10-CM | POA: Diagnosis not present

## 2024-04-15 ENCOUNTER — Other Ambulatory Visit: Payer: Self-pay | Admitting: Family Medicine

## 2024-04-24 ENCOUNTER — Other Ambulatory Visit

## 2024-04-28 ENCOUNTER — Ambulatory Visit: Admitting: Family Medicine

## 2024-04-29 ENCOUNTER — Ambulatory Visit: Admitting: Family Medicine

## 2024-04-30 DIAGNOSIS — H5213 Myopia, bilateral: Secondary | ICD-10-CM | POA: Diagnosis not present

## 2024-05-08 DIAGNOSIS — Z419 Encounter for procedure for purposes other than remedying health state, unspecified: Secondary | ICD-10-CM | POA: Diagnosis not present

## 2024-05-16 ENCOUNTER — Other Ambulatory Visit: Payer: Self-pay

## 2024-05-16 ENCOUNTER — Encounter: Payer: Self-pay | Admitting: *Deleted

## 2024-05-16 ENCOUNTER — Ambulatory Visit (INDEPENDENT_AMBULATORY_CARE_PROVIDER_SITE_OTHER)

## 2024-05-16 ENCOUNTER — Ambulatory Visit
Admission: EM | Admit: 2024-05-16 | Discharge: 2024-05-16 | Disposition: A | Discharge status: Home / Self Care | Attending: Emergency Medicine | Admitting: Emergency Medicine

## 2024-05-16 DIAGNOSIS — M25562 Pain in left knee: Secondary | ICD-10-CM

## 2024-05-16 NOTE — Discharge Instructions (Addendum)
 There is no bony abnormality on your xray You may have sprained the knee or have a different soft tissue injury.  Please contact [facility] for a follow up visit. I recommend to call first thing Monday morning. They can further assess your leg pain, and potentially help with the phantom limb pain as well. Otherwise I recommend to speak with your pain management clinic about this.

## 2024-05-16 NOTE — ED Triage Notes (Signed)
 Pt reports she slipped and fell twice a week ago at a band event. States her left leg (BKA) went behind her. States she has been an amputee for about a year and has adapted well until this point. States since the fall she is having continued pain in her left leg and is concerned she may have damaged her L knee joint. She sees pain management and doesn't have to use percocet 2/325 often but since the fall she has been taking her max prescribed dose without relief.
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
## 2024-05-21 ENCOUNTER — Telehealth: Payer: Self-pay | Admitting: Internal Medicine

## 2024-05-21 MED ORDER — NOVOLOG FLEXPEN 100 UNIT/ML ~~LOC~~ SOPN: PEN_INJECTOR | SUBCUTANEOUS | 0 refills | Status: DC

## 2024-05-21 NOTE — Telephone Encounter (Signed)
 MEDICATION: insulin  aspart (NOVOLOG  FLEXPEN) 100 UNIT/ML FlexPen   PHARMACY:    [id number] [address], [place] - [address] (Ph: [phone])    HAS THE PATIENT CONTACTED THEIR PHARMACY?  Yes  LAST REFILL:  @@LASTREFILL @  IS THIS A 90 DAY SUPPLY : Yes  IS PATIENT OUT OF MEDICATION: Yes  IF NOT; HOW MUCH IS LEFT:   LAST APPOINTMENT DATE: @10 /17/2024  NEXT APPOINTMENT DATE:@10 /08/2023  DO WE HAVE YOUR PERMISSION TO LEAVE A DETAILED MESSAGE?: Yes  OTHER COMMENTS:    **Let patient know to contact pharmacy at the end of the day to make sure medication is ready. **  ** Please notify patient to allow 48-72 hours to process**  **Encourage patient to contact the pharmacy for refills or they can request refills through [facility]**

## 2024-05-21 NOTE — Telephone Encounter (Signed)
 Prescription has been sent.

## 2024-05-23 ENCOUNTER — Encounter (HOSPITAL_COMMUNITY): Payer: Self-pay

## 2024-05-23 ENCOUNTER — Emergency Department (EMERGENCY_DEPARTMENT_HOSPITAL)
Admission: EM | Admit: 2024-05-23 | Discharge: 2024-05-24 | Disposition: A | Discharge status: Other Acute Inpatient Hospital | Source: Home / Self Care

## 2024-05-23 ENCOUNTER — Other Ambulatory Visit: Payer: Self-pay

## 2024-05-23 DIAGNOSIS — I10 Essential (primary) hypertension: Secondary | ICD-10-CM | POA: Insufficient documentation

## 2024-05-23 DIAGNOSIS — R45851 Suicidal ideations: Secondary | ICD-10-CM | POA: Diagnosis not present

## 2024-05-23 DIAGNOSIS — E119 Type 2 diabetes mellitus without complications: Secondary | ICD-10-CM | POA: Insufficient documentation

## 2024-05-23 DIAGNOSIS — Z733 Stress, not elsewhere classified: Secondary | ICD-10-CM | POA: Insufficient documentation

## 2024-05-23 DIAGNOSIS — Z794 Long term (current) use of insulin: Secondary | ICD-10-CM | POA: Insufficient documentation

## 2024-05-23 DIAGNOSIS — Z79899 Other long term (current) drug therapy: Secondary | ICD-10-CM | POA: Insufficient documentation

## 2024-05-23 LAB — COMPREHENSIVE METABOLIC PANEL WITH GFR
ALT: 18 U/L (ref 0–44)
AST: 24 U/L (ref 15–41)
Albumin: 4.2 g/dL (ref 3.5–5.0)
Alkaline Phosphatase: 161 U/L — ABNORMAL HIGH (ref 38–126)
Anion gap: 14 (ref 5–15)
BUN: 17 mg/dL (ref 6–20)
CO2: 23 mmol/L (ref 22–32)
Calcium: 9.7 mg/dL (ref 8.9–10.3)
Chloride: 99 mmol/L (ref 98–111)
Creatinine, Ser: 0.87 mg/dL (ref 0.44–1.00)
GFR, Estimated: >60 mL/min (ref >60)
Glucose, Bld: 187 mg/dL — ABNORMAL HIGH (ref 70–99)
Potassium: 4 mmol/L (ref 3.5–5.1)
Sodium: 136 mmol/L (ref 135–145)
Total Bilirubin: 0.4 mg/dL (ref 0.0–1.2)
Total Protein: 7.3 g/dL (ref 6.5–8.1)

## 2024-05-23 LAB — CBC
HCT: 40.5 % (ref 36.0–46.0)
Hemoglobin: 12.5 g/dL (ref 12.0–15.0)
MCH: 26.7 pg (ref 26.0–34.0)
MCHC: 30.9 g/dL (ref 30.0–36.0)
MCV: 86.5 fL (ref 80.0–100.0)
Platelets: 316 K/uL (ref 150–400)
RBC: 4.68 MIL/uL (ref 3.87–5.11)
RDW: 14.4 % (ref 11.5–15.5)
WBC: 10.4 K/uL (ref 4.0–10.5)
nRBC: 0 % (ref 0.0–0.2)

## 2024-05-23 LAB — URINE DRUG SCREEN
Amphetamines: NEGATIVE
Barbiturates: NEGATIVE
Benzodiazepines: NEGATIVE
Cocaine: NEGATIVE
Fentanyl: NEGATIVE
Methadone Scn, Ur: NEGATIVE
Opiates: NEGATIVE
Tetrahydrocannabinol: NEGATIVE

## 2024-05-23 LAB — ETHANOL: Alcohol, Ethyl (B): <15 mg/dL (ref <15)

## 2024-05-23 MED ORDER — LISINOPRIL 10 MG PO TABS
10.0000 mg | ORAL_TABLET | Freq: Once | ORAL | Status: AC
  Administered 2024-05-23: 10 mg via ORAL
  Filled 2024-05-23: qty 1

## 2024-05-23 MED ORDER — ACETAMINOPHEN 325 MG PO TABS
975.0000 mg | ORAL_TABLET | Freq: Once | ORAL | Status: AC
  Administered 2024-05-23: 975 mg via ORAL
  Filled 2024-05-23: qty 3
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
## 2024-05-23 NOTE — ED Notes (Signed)
 Pt has one belonging bag, under the cabinet in triage. It is labeled
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
## 2024-05-24 ENCOUNTER — Encounter (HOSPITAL_COMMUNITY): Payer: Self-pay | Admitting: Physician Assistant

## 2024-05-24 ENCOUNTER — Inpatient Hospital Stay (HOSPITAL_COMMUNITY)
Admission: AD | Admit: 2024-05-24 | Discharge: 2024-05-28 | DRG: 885 | Disposition: A | Discharge status: Home / Self Care | Payer: Self-pay | Source: Intra-hospital

## 2024-05-24 DIAGNOSIS — Z833 Family history of diabetes mellitus: Secondary | ICD-10-CM | POA: Diagnosis not present

## 2024-05-24 DIAGNOSIS — Z7985 Long-term (current) use of injectable non-insulin antidiabetic drugs: Secondary | ICD-10-CM

## 2024-05-24 DIAGNOSIS — Z811 Family history of alcohol abuse and dependence: Secondary | ICD-10-CM | POA: Diagnosis not present

## 2024-05-24 DIAGNOSIS — E11610 Type 2 diabetes mellitus with diabetic neuropathic arthropathy: Secondary | ICD-10-CM | POA: Diagnosis not present

## 2024-05-24 DIAGNOSIS — Z823 Family history of stroke: Secondary | ICD-10-CM | POA: Diagnosis not present

## 2024-05-24 DIAGNOSIS — Z825 Family history of asthma and other chronic lower respiratory diseases: Secondary | ICD-10-CM

## 2024-05-24 DIAGNOSIS — Z79899 Other long term (current) drug therapy: Secondary | ICD-10-CM

## 2024-05-24 DIAGNOSIS — Z888 Allergy status to other drugs, medicaments and biological substances status: Secondary | ICD-10-CM

## 2024-05-24 DIAGNOSIS — R45851 Suicidal ideations: Secondary | ICD-10-CM | POA: Diagnosis not present

## 2024-05-24 DIAGNOSIS — Z794 Long term (current) use of insulin: Secondary | ICD-10-CM

## 2024-05-24 DIAGNOSIS — Z821 Family history of blindness and visual loss: Secondary | ICD-10-CM

## 2024-05-24 DIAGNOSIS — E1140 Type 2 diabetes mellitus with diabetic neuropathy, unspecified: Secondary | ICD-10-CM | POA: Diagnosis not present

## 2024-05-24 DIAGNOSIS — F411 Generalized anxiety disorder: Secondary | ICD-10-CM | POA: Diagnosis not present

## 2024-05-24 DIAGNOSIS — E785 Hyperlipidemia, unspecified: Secondary | ICD-10-CM | POA: Diagnosis present

## 2024-05-24 DIAGNOSIS — Z818 Family history of other mental and behavioral disorders: Secondary | ICD-10-CM | POA: Diagnosis not present

## 2024-05-24 DIAGNOSIS — Z9842 Cataract extraction status, left eye: Secondary | ICD-10-CM | POA: Diagnosis not present

## 2024-05-24 DIAGNOSIS — F332 Major depressive disorder, recurrent severe without psychotic features: Principal | ICD-10-CM | POA: Diagnosis present

## 2024-05-24 DIAGNOSIS — Z9884 Bariatric surgery status: Secondary | ICD-10-CM

## 2024-05-24 DIAGNOSIS — I10 Essential (primary) hypertension: Secondary | ICD-10-CM | POA: Diagnosis present

## 2024-05-24 DIAGNOSIS — G546 Phantom limb syndrome with pain: Secondary | ICD-10-CM | POA: Diagnosis present

## 2024-05-24 DIAGNOSIS — Z8249 Family history of ischemic heart disease and other diseases of the circulatory system: Secondary | ICD-10-CM | POA: Diagnosis not present

## 2024-05-24 DIAGNOSIS — Z89512 Acquired absence of left leg below knee: Secondary | ICD-10-CM

## 2024-05-24 DIAGNOSIS — F4310 Post-traumatic stress disorder, unspecified: Secondary | ICD-10-CM | POA: Diagnosis not present

## 2024-05-24 LAB — LIPID PANEL
Cholesterol: 203 mg/dL — ABNORMAL HIGH (ref 0–200)
HDL: 43 mg/dL (ref >40)
LDL Cholesterol: 83 mg/dL (ref 0–99)
Total CHOL/HDL Ratio: 4.8 ratio
Triglycerides: 385 mg/dL — ABNORMAL HIGH (ref <150)
VLDL: 77 mg/dL — ABNORMAL HIGH (ref 0–40)

## 2024-05-24 LAB — GLUCOSE, CAPILLARY
Glucose-Capillary: 250 mg/dL — ABNORMAL HIGH (ref 70–99)
Glucose-Capillary: 294 mg/dL — ABNORMAL HIGH (ref 70–99)
Glucose-Capillary: 271 mg/dL — ABNORMAL HIGH (ref 70–99)
Glucose-Capillary: 290 mg/dL — ABNORMAL HIGH (ref 70–99)
Glucose-Capillary: 460 mg/dL — ABNORMAL HIGH (ref 70–99)

## 2024-05-24 LAB — HEMOGLOBIN A1C
Hgb A1c MFr Bld: 9.8 % — ABNORMAL HIGH (ref 4.8–5.6)
Mean Plasma Glucose: 234.56 mg/dL

## 2024-05-24 MED ORDER — HALOPERIDOL 5 MG PO TABS: 5.0000 mg | ORAL_TABLET | Freq: Three times a day (TID) | ORAL | Status: DC | PRN

## 2024-05-24 MED ORDER — ESCITALOPRAM OXALATE 5 MG PO TABS: 5.0000 mg | ORAL_TABLET | Freq: Every day | ORAL | Status: DC

## 2024-05-24 MED ORDER — AMITRIPTYLINE HCL 25 MG PO TABS
75.0000 mg | ORAL_TABLET | Freq: Every day | ORAL | Status: DC
  Administered 2024-05-24 – 2024-05-27 (×4): 75 mg via ORAL
  Filled 2024-05-24 (×4): qty 3

## 2024-05-24 MED ORDER — OLANZAPINE 5 MG PO TBDP: 5.0000 mg | ORAL_TABLET | Freq: Three times a day (TID) | ORAL | Status: DC | PRN

## 2024-05-24 MED ORDER — INSULIN ASPART 100 UNIT/ML IJ SOLN
0.0000 [IU] | Freq: Every day | INTRAMUSCULAR | Status: DC
  Administered 2024-05-24: 5 [IU] via SUBCUTANEOUS
  Administered 2024-05-25: 3 [IU] via SUBCUTANEOUS
  Administered 2024-05-26 – 2024-05-27 (×2): 2 [IU] via SUBCUTANEOUS

## 2024-05-24 MED ORDER — ACETAMINOPHEN 325 MG PO TABS: 650.0000 mg | ORAL_TABLET | Freq: Four times a day (QID) | ORAL | Status: DC | PRN

## 2024-05-24 MED ORDER — INSULIN ASPART 100 UNIT/ML IJ SOLN
0.0000 [IU] | Freq: Three times a day (TID) | INTRAMUSCULAR | Status: DC
  Administered 2024-05-24: 5 [IU] via SUBCUTANEOUS
  Administered 2024-05-24: 3 [IU] via SUBCUTANEOUS
  Administered 2024-05-24: 5 [IU] via SUBCUTANEOUS
  Administered 2024-05-25: 9 [IU] via SUBCUTANEOUS
  Administered 2024-05-25: 5 [IU] via SUBCUTANEOUS

## 2024-05-24 MED ORDER — DIPHENHYDRAMINE HCL 50 MG/ML IJ SOLN: 50.0000 mg | Freq: Three times a day (TID) | INTRAMUSCULAR | Status: DC | PRN

## 2024-05-24 MED ORDER — TRAZODONE HCL 50 MG PO TABS: 50.0000 mg | ORAL_TABLET | Freq: Every evening | ORAL | Status: DC | PRN

## 2024-05-24 MED ORDER — LISINOPRIL 5 MG PO TABS
10.0000 mg | ORAL_TABLET | Freq: Every day | ORAL | Status: DC
  Administered 2024-05-24 – 2024-05-28 (×4): 10 mg via ORAL
  Filled 2024-05-24 (×5): qty 2

## 2024-05-24 MED ORDER — LORAZEPAM 2 MG/ML IJ SOLN: 2.0000 mg | Freq: Three times a day (TID) | INTRAMUSCULAR | Status: DC | PRN

## 2024-05-24 MED ORDER — ESCITALOPRAM OXALATE 10 MG PO TABS
10.0000 mg | ORAL_TABLET | Freq: Every day | ORAL | Status: DC
  Administered 2024-05-25 – 2024-05-28 (×4): 10 mg via ORAL
  Filled 2024-05-24 (×4): qty 1

## 2024-05-24 MED ORDER — DIPHENHYDRAMINE HCL 25 MG PO CAPS: 50.0000 mg | ORAL_CAPSULE | Freq: Three times a day (TID) | ORAL | Status: DC | PRN

## 2024-05-24 MED ORDER — HALOPERIDOL LACTATE 5 MG/ML IJ SOLN: 10.0000 mg | Freq: Three times a day (TID) | INTRAMUSCULAR | Status: DC | PRN

## 2024-05-24 MED ORDER — HYDROXYZINE HCL 25 MG PO TABS
25.0000 mg | ORAL_TABLET | Freq: Three times a day (TID) | ORAL | Status: DC | PRN
  Administered 2024-05-24: 25 mg via ORAL
  Filled 2024-05-24: qty 1

## 2024-05-24 MED ORDER — ROSUVASTATIN CALCIUM 20 MG PO TABS
20.0000 mg | ORAL_TABLET | Freq: Every day | ORAL | Status: DC
  Administered 2024-05-24 – 2024-05-28 (×5): 20 mg via ORAL
  Filled 2024-05-24 (×5): qty 1

## 2024-05-24 MED ORDER — PREGABALIN 100 MG PO CAPS
600.0000 mg | ORAL_CAPSULE | Freq: Every day | ORAL | Status: DC
  Administered 2024-05-24 – 2024-05-27 (×4): 600 mg via ORAL
  Filled 2024-05-24 (×4): qty 6

## 2024-05-24 MED ORDER — MAGNESIUM HYDROXIDE 400 MG/5ML PO SUSP: 30.0000 mL | Freq: Every day | ORAL | Status: DC | PRN

## 2024-05-24 MED ORDER — ALUM & MAG HYDROXIDE-SIMETH 200-200-20 MG/5ML PO SUSP: 30.0000 mL | ORAL | Status: DC | PRN

## 2024-05-24 MED ORDER — BUPROPION HCL ER (XL) 150 MG PO TB24
150.0000 mg | ORAL_TABLET | Freq: Every day | ORAL | Status: DC
  Administered 2024-05-24: 150 mg via ORAL
  Filled 2024-05-24: qty 1

## 2024-05-24 MED ORDER — HALOPERIDOL LACTATE 5 MG/ML IJ SOLN: 5.0000 mg | Freq: Three times a day (TID) | INTRAMUSCULAR | Status: DC | PRN

## 2024-05-24 MED ORDER — OLANZAPINE 10 MG IM SOLR: 10.0000 mg | Freq: Three times a day (TID) | INTRAMUSCULAR | Status: DC | PRN

## 2024-05-24 NOTE — Progress Notes (Signed)
(  Sleep Hours) -  (Any PRNs that were needed, meds refused, or side effects to meds)-   (Any disturbances and when (visitation, over night)-  (Concerns raised by the patient)-   (SI/HI/AVH)-
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
## 2024-05-24 NOTE — Plan of Care (Signed)
   Problem: Education: Goal: Knowledge of [facility] Education information/materials will improve Outcome: Progressing Goal: Emotional status will improve Outcome: Progressing Goal: Mental status will improve Outcome: Progressing Goal: Verbalization of understanding the information provided will improve Outcome: Progressing   Problem: Coping: Goal: Ability to verbalize frustrations and anger appropriately will improve Outcome: Progressing Goal: Ability to demonstrate self-control will improve Outcome: Progressing

## 2024-05-24 NOTE — Group Note (Signed)
 Date:  05/24/2024 Time:  10:50 AM  Group Topic/Focus:  Goals Group:   The focus of this group is to help patients establish daily goals to achieve during treatment and discuss how the patient can incorporate goal setting into their daily lives to aide in recovery.    Participation Level:  Did Not Attend  Participation Quality:  Did Not Attend  Affect:  Did Not Attend  Cognitive:  Did Not Attend  Insight: None  Engagement in Group:  Did Not Attend  Modes of Intervention:  Did Not Attend  Additional Comments:  Did Not Attend  Donna Kidd 05/24/2024, 10:50 AM

## 2024-05-24 NOTE — Plan of Care (Signed)
   Problem: Education: Goal: Emotional status will improve Outcome: Progressing Goal: Mental status will improve Outcome: Progressing Goal: Verbalization of understanding the information provided will improve Outcome: Progressing
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
## 2024-05-24 NOTE — Plan of Care (Signed)
   Problem: Education: Goal: Knowledge of [facility] Education information/materials will improve Outcome: Progressing Goal: Emotional status will improve Outcome: Progressing Goal: Mental status will improve Outcome: Progressing Goal: Verbalization of understanding the information provided will improve Outcome: Progressing
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
## 2024-05-24 NOTE — ED Notes (Signed)
 Report called to RN Amia at [facility]. [employer] called and transport arranged.

## 2024-05-24 NOTE — [facility] Group Notes (Signed)
 [facility] Group Notes:  (Nursing/MHT/Case Management/Adjunct)  Date:  05/24/2024  Time:  11:00 PM  Type of Therapy:  Wrap up group  Participation Level:  Active  Participation Quality:  Appropriate  Affect:  Appropriate  Cognitive:  Appropriate  Insight:  Appropriate  Engagement in Group:  Engaged  Modes of Intervention:  Education  Summary of Progress/Problems: Goal to participate in program. Rated day 9/10.  Grayce LITTIE Essex 05/24/2024, 11:00 PM

## 2024-05-24 NOTE — Progress Notes (Signed)
   05/24/24 1035  Psych Admission Type (Psych Patients Only)  Admission Status Involuntary  Psychosocial Assessment  Patient Complaints None  Eye Contact Fair  Facial Expression Sad;Flat  Affect Sad  Speech Logical/coherent  Interaction Assertive  Motor Activity Unsteady (Prostetic Leg)  Appearance/Hygiene Unremarkable  Behavior Characteristics Cooperative  Mood Sad  Thought Process  Coherency WDL  Content WDL  Delusions None reported or observed  Perception WDL  Hallucination None reported or observed  Judgment Limited  Confusion None  Danger to Self  Current suicidal ideation? Denies  Agreement Not to Harm Self Yes  Description of Agreement Verbal  Danger to Others  Danger to Others None reported or observed

## 2024-05-24 NOTE — Group Note (Signed)
 Date:  05/24/2024 Time:  7:25 PM  Group Topic/Focus:  Group used a non- competitive card game to build mindful listening skills, and encourage acceptance and understanding of oneself and peers. Patients share, fostering empathy and personal growth in a supportive environment.    Participation Level:  Did Not Attend  Donna Kidd 05/24/2024, 7:25 PM

## 2024-05-25 ENCOUNTER — Encounter (HOSPITAL_COMMUNITY): Payer: Self-pay

## 2024-05-25 LAB — GLUCOSE, CAPILLARY
Glucose-Capillary: 265 mg/dL — ABNORMAL HIGH (ref 70–99)
Glucose-Capillary: 394 mg/dL — ABNORMAL HIGH (ref 70–99)
Glucose-Capillary: 281 mg/dL — ABNORMAL HIGH (ref 70–99)
Glucose-Capillary: 255 mg/dL — ABNORMAL HIGH (ref 70–99)

## 2024-05-25 MED ORDER — HYDROXYZINE HCL 10 MG PO TABS
10.0000 mg | ORAL_TABLET | Freq: Three times a day (TID) | ORAL | Status: DC | PRN
  Administered 2024-05-25: 10 mg via ORAL

## 2024-05-25 MED ORDER — HYDROXYZINE HCL 10 MG PO TABS
10.0000 mg | ORAL_TABLET | Freq: Every day | ORAL | Status: DC
  Administered 2024-05-25 – 2024-05-27 (×3): 10 mg via ORAL
  Filled 2024-05-25 (×3): qty 1

## 2024-05-25 MED ORDER — INSULIN ASPART 100 UNIT/ML IJ SOLN
0.0000 [IU] | Freq: Three times a day (TID) | INTRAMUSCULAR | Status: DC
  Administered 2024-05-25: 5 [IU] via SUBCUTANEOUS
  Administered 2024-05-26 (×2): 7 [IU] via SUBCUTANEOUS
  Administered 2024-05-26 – 2024-05-27 (×2): 5 [IU] via SUBCUTANEOUS
  Administered 2024-05-27: 2 [IU] via SUBCUTANEOUS
  Administered 2024-05-27: 5 [IU] via SUBCUTANEOUS
  Administered 2024-05-28: 3 [IU] via SUBCUTANEOUS

## 2024-05-25 MED ORDER — INSULIN GLARGINE-YFGN 100 UNIT/ML ~~LOC~~ SOLN: 26.0000 [IU] | Freq: Every day | SUBCUTANEOUS | Status: DC

## 2024-05-25 MED ORDER — INSULIN ASPART 100 UNIT/ML IJ SOLN
10.0000 [IU] | Freq: Three times a day (TID) | INTRAMUSCULAR | Status: DC
  Administered 2024-05-25 – 2024-05-28 (×8): 10 [IU] via SUBCUTANEOUS

## 2024-05-25 MED ORDER — INSULIN GLARGINE 100 UNIT/ML ~~LOC~~ SOLN
26.0000 [IU] | Freq: Every day | SUBCUTANEOUS | Status: DC
  Administered 2024-05-26 – 2024-05-28 (×3): 26 [IU] via SUBCUTANEOUS
  Filled 2024-05-25 (×4): qty 0.26

## 2024-05-25 NOTE — Progress Notes (Signed)
(  Sleep Hours) -  (Any PRNs that were needed, meds refused, or side effects to meds)-   (Any disturbances and when (visitation, over night)-  (Concerns raised by the patient)-   (SI/HI/AVH)-

## 2024-05-25 NOTE — Plan of Care (Signed)
  Problem: Education: Goal: Knowledge of [facility] Education information/materials will improve Outcome: Progressing Goal: Emotional status will improve Outcome: Progressing Goal: Mental status will improve Outcome: Progressing Goal: Verbalization of understanding the information provided will improve Outcome: Progressing   Problem: Activity: Goal: Interest or engagement in activities will improve Outcome: Progressing Goal: Sleeping patterns will improve Outcome: Progressing   Problem: Coping: Goal: Ability to verbalize frustrations and anger appropriately will improve Outcome: Progressing Goal: Ability to demonstrate self-control will improve Outcome: Progressing   Problem: Health Behavior/Discharge Planning: Goal: Identification of resources available to assist in meeting health care needs will improve Outcome: Progressing Goal: Compliance with treatment plan for underlying cause of condition will improve Outcome: Progressing   Problem: Physical Regulation: Goal: Ability to maintain clinical measurements within normal limits will improve Outcome: Progressing   Problem: Safety: Goal: Periods of time without injury will increase Outcome: Progressing   Problem: Education: Goal: Ability to describe self-care measures that may prevent or decrease complications (Diabetes Survival Skills Education) will improve Outcome: Progressing Goal: Individualized Educational Video(s) Outcome: Progressing   Problem: Coping: Goal: Ability to adjust to condition or change in health will improve Outcome: Progressing   Problem: Fluid Volume: Goal: Ability to maintain a balanced intake and output will improve Outcome: Progressing   Problem: Health Behavior/Discharge Planning: Goal: Ability to identify and utilize available resources and services will improve Outcome: Progressing Goal: Ability to manage health-related needs will improve Outcome: Progressing   Problem:  Metabolic: Goal: Ability to maintain appropriate glucose levels will improve Outcome: Progressing   Problem: Nutritional: Goal: Maintenance of adequate nutrition will improve Outcome: Progressing Goal: Progress toward achieving an optimal weight will improve Outcome: Progressing   Problem: Skin Integrity: Goal: Risk for impaired skin integrity will decrease Outcome: Progressing   Problem: Tissue Perfusion: Goal: Adequacy of tissue perfusion will improve Outcome: Progressing

## 2024-05-25 NOTE — Group Note (Signed)
 Date:  05/25/2024 Time:  10:37 AM  Group Topic/Focus:  Goals Group:   The focus of this group is to help patients establish daily goals to achieve during treatment and discuss how the patient can incorporate goal setting into their daily lives to aide in recovery.    Participation Level:  Active  Participation Quality:  Attentive  Affect:  Appropriate  Cognitive:  Alert  Insight: Good  Engagement in Group:  Engaged  Modes of Intervention:  Education  Additional Comments:  Donna Kidd, is recognizing the things she need to change in her life in order to maintain her sanity.    Donna Kidd 05/25/2024, 10:37 AM
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
## 2024-05-25 NOTE — Group Note (Signed)
 Recreation Therapy Group Note   Group Topic:Team Building  Group Date: 05/25/2024 Start Time: 0955 End Time: 1025 Facilitators: Elin Fenley-McCall, LRT,CTRS Location: [address] Dayroom   Group Topic: Communication, Team Building, Problem Solving  Goal Area(s) Addresses:  Patient will effectively work with peer towards shared goal.  Patient will identify skills used to make activity successful.  Patient will identify how skills used during activity can be applied to reach post d/c goals.   Behavioral Response: Active  Intervention: STEM Activity- Glass blower/designer  Activity: Tallest [employer]. In teams of 5-6, patients were given 11 craft pipe cleaners. Using the materials provided, patients were instructed to compete again the opposing team(s) to build the tallest free-standing structure from floor level. The activity was timed; difficulty increased by Clinical research associate as Production designer, theatre/television/film continued.  Systematically resources were removed with additional directions for example, placing one arm behind their back, working in silence, and shape stipulations. LRT facilitated post-activity discussion reviewing team processes and necessary communication skills involved in completion. Patients were encouraged to reflect how the skills utilized, or not utilized, in this activity can be incorporated to positively impact support systems post discharge.  Education: Pharmacist, community, Scientist, physiological, Discharge Planning   Education Outcome: Acknowledges education/In group clarification offered/Needs additional education.    Affect/Mood: Appropriate   Participation Level: Engaged   Participation Quality: Independent   Behavior: Appropriate   Speech/Thought Process: Focused   Insight: Good   Judgement: Good   Modes of Intervention: STEM Activity   Patient Response to Interventions:  Engaged   Education Outcome:  In group clarification offered    Clinical  Observations/Individualized Feedback: Pt was attentive and active during group session. Pt was called out of group for treatment team but later returned.     Plan: Continue to engage patient in RT group sessions 2-3x/week.   Rankin Coolman-McCall, LRT,CTRS  05/25/2024 12:06 PM

## 2024-05-25 NOTE — Progress Notes (Signed)
 PT/OT Note  Patient Details Name: Donna Kidd MRN: [id number] DOB: 02-13-1966  Aware of order for OT at this time for ideas of replacement shoelaces for her shoe on prosthetic limb. Communicating with MD and nurse on possibilities and pt may be having family bring in a slip on shoe as well.   Will touch base with patients caregivers tomorrow to see if this is resolved or if there are further needs from PT or OT at this time. Please keep us  posted.   Thank you    MILLICENT SALES 05/25/2024, 6:11 PM SALES, PT, MPT Acute Rehabilitation Services Office: [phone] If a weekend: secure chat groups: [facility] PT, [facility] OT, [facility] SLP 05/25/2024

## 2024-05-25 NOTE — Progress Notes (Signed)
   05/25/24 1000  Psych Admission Type (Psych Patients Only)  Admission Status Involuntary  Psychosocial Assessment  Patient Complaints None  Eye Contact Fair  Facial Expression Animated  Affect Sad  Speech Logical/coherent  Interaction Assertive  Motor Activity Other (Comment)  Appearance/Hygiene Unremarkable  Behavior Characteristics Cooperative  Mood Depressed  Thought Process  Coherency WDL  Content WDL  Delusions None reported or observed  Perception WDL  Hallucination None reported or observed  Judgment Impaired  Confusion None  Danger to Self  Current suicidal ideation? Denies  Danger to Others  Danger to Others None reported or observed

## 2024-05-25 NOTE — Group Note (Signed)
 Date:  05/25/2024 Time:  11:33 AM  Group Topic/Focus:  Emotional Education:   The focus of this group is to discuss what feelings/emotions are, and how they are experienced.    Participation Level:  Active  Participation Quality:  Attentive  Affect:  Appropriate  Cognitive:  Alert  Insight: Good  Engagement in Group:  Engaged  Modes of Intervention:  Watched a movie  Additional Comments:    Donna Kidd 05/25/2024, 11:33 AM

## 2024-05-25 NOTE — Group Note (Signed)
 Date:  05/25/2024 Time:  9:22 PM  Group Topic/Focus:  Wrap-Up Group:   The focus of this group is to help patients review their daily goal of treatment and discuss progress on daily workbooks. Alcoholics Anonymous (AA) Meeting   Participation Level:  Active  Participation Quality:  Appropriate  Affect:  Appropriate  Cognitive:  Appropriate  Insight: Appropriate  Engagement in Group:  Engaged  Modes of Intervention:  Discussion, Socialization, and Support  Additional Comments:  Patient attended.  Eward Mace 05/25/2024, 9:22 PM

## 2024-05-25 NOTE — Inpatient Diabetes Management (Signed)
 Inpatient Diabetes Program Recommendations  AACE/ADA: New Consensus Statement on Inpatient Glycemic Control (2015)  Target Ranges:  Prepandial:   less than 140 mg/dL      Peak postprandial:   less than 180 mg/dL (1-2 hours)      Critically ill patients:  140 - 180 mg/dL   Lab Results  Component Value Date   GLUCAP 394 (H) 05/25/2024   HGBA1C 9.8 (H) 05/24/2024    Review of Glycemic Control  Diabetes history: DM2 Outpatient Diabetes medications: Tresiba  26 units daily, Novolog  8 units TID with meals and HS and s/s, usually 8-10 units per meal, Mounjaro  7.5 mg weekly, Dexcom G7 Current orders for Inpatient glycemic control: Novolog  0-9 TID with meals and 0-5 HS  HgbA1C - 9.8% May need part of basal insulin  while inpatient.  Inpatient Diabetes Program Recommendations:    Consider adding Lantus  12 units at bedtime  Consider adding Novolog  4 units TID with meals if eating > 50%  Will continue to follow.   Thank you. Shona Brandy, RD, LDN, CDCES Inpatient Diabetes Coordinator [phone]

## 2024-05-25 NOTE — Group Note (Signed)
 Date:  05/25/2024 Time:  3:59 PM  Group Topic/Focus: Grief Market researcher  Emotional Education:   The focus of this group is to discuss what feelings/emotions are, and how they are experienced. Managing Feelings:   The focus of this group is to identify what feelings patients have difficulty handling and develop a plan to handle them in a healthier way upon discharge. Grief is a journey through emotions that can change  from day to day, or even moment to moment. These  twists and turns often feel unsettling--a lot like being  on a roller coaster. Assignment: Label Roller coaster for your unique emotional needs.    Additional Comments:  Patient did not attend   Woodroe Vogan N Michele Judy 05/25/2024, 3:59 PM

## 2024-05-25 NOTE — BH IP Treatment Plan (Signed)
 Interdisciplinary Treatment and Diagnostic Plan Update  05/25/2024 Time of Session: 10:05am Donna Kidd MRN: [id number]  Principal Diagnosis: MDD (major depressive disorder), recurrent episode, severe (HCC)  Secondary Diagnoses: Principal Problem:   MDD (major depressive disorder), recurrent episode, severe (HCC) Active Problems:   Suicidal ideation   PTSD (post-traumatic stress disorder)   GAD (generalized anxiety disorder)   Current Medications:  Current Facility-Administered Medications  Medication Dose Route Frequency Provider Last Rate Last Admin   acetaminophen  (TYLENOL ) tablet 650 mg  650 mg Oral Q6H PRN Ezzard Staci SAILOR, NP       alum & mag hydroxide-simeth (MAALOX/MYLANTA) 200-200-20 MG/5ML suspension 30 mL  30 mL Oral Q4H PRN Lewis, Tanika N, NP       amitriptyline  (ELAVIL ) tablet 75 mg  75 mg Oral QHS Hoang, Daniela B, MD   75 mg at 05/24/24 2114   haloperidol (HALDOL) tablet 5 mg  5 mg Oral TID PRN Nwoko, Uchenna E, PA       And   diphenhydrAMINE  (BENADRYL ) capsule 50 mg  50 mg Oral TID PRN Nwoko, Uchenna E, PA       haloperidol lactate (HALDOL) injection 5 mg  5 mg Intramuscular TID PRN Ezzard Staci SAILOR, NP       And   diphenhydrAMINE  (BENADRYL ) injection 50 mg  50 mg Intramuscular TID PRN Lewis, Tanika N, NP       And   LORazepam (ATIVAN) injection 2 mg  2 mg Intramuscular TID PRN Ezzard Staci SAILOR, NP       haloperidol lactate (HALDOL) injection 10 mg  10 mg Intramuscular TID PRN Nwoko, Uchenna E, PA       And   diphenhydrAMINE  (BENADRYL ) injection 50 mg  50 mg Intramuscular TID PRN Nwoko, Uchenna E, PA       And   LORazepam (ATIVAN) injection 2 mg  2 mg Intramuscular TID PRN Nwoko, Uchenna E, PA       escitalopram (LEXAPRO) tablet 10 mg  10 mg Oral Daily Hoang, Daniela B, MD   10 mg at 05/25/24 0754   hydrOXYzine (ATARAX) tablet 25 mg  25 mg Oral TID PRN Nwoko, Uchenna E, PA   25 mg at 05/24/24 2114   insulin  aspart (novoLOG ) injection 0-5 Units  0-5 Units  Subcutaneous QHS Lewis, Tanika N, NP   5 Units at 05/24/24 2116   insulin  aspart (novoLOG ) injection 0-9 Units  0-9 Units Subcutaneous TID WC Ezzard Staci SAILOR, NP   9 Units at 05/25/24 1245   lisinopril  (ZESTRIL ) tablet 10 mg  10 mg Oral Daily Ezzard Staci SAILOR, NP   10 mg at 05/25/24 0754   magnesium hydroxide (MILK OF MAGNESIA) suspension 30 mL  30 mL Oral Daily PRN Nwoko, Uchenna E, PA       pregabalin  (LYRICA ) capsule 600 mg  600 mg Oral QHS Hoang, Daniela B, MD   600 mg at 05/24/24 2114   rosuvastatin  (CRESTOR ) tablet 20 mg  20 mg Oral Daily Hoang, Daniela B, MD   20 mg at 05/25/24 0950   PTA Medications: Medications Prior to Admission  Medication Sig Dispense Refill Last Dose/Taking   acetaminophen  (TYLENOL ) 500 MG tablet Take 1,000 mg by mouth every 6 (six) hours as needed for moderate pain (pain score 4-6).   05/23/2024   amitriptyline  (ELAVIL ) 75 MG tablet Take 1 tablet (75 mg total) by mouth at bedtime.   Past Week   Biotin 1000 MCG tablet Take 1,000 mcg by mouth.   Past  Month   buPROPion  (WELLBUTRIN  XL) 150 MG 24 hr tablet Take 1 tablet (150 mg total) by mouth daily. 90 tablet 1 Past Month   Continuous Glucose Sensor (DEXCOM G7 SENSOR) MISC 1 Device by Does not apply route as directed. 9 each 3 05/23/2024   ferrous sulfate  325 (65 FE) MG tablet TAKE 1 TABLET BY MOUTH EVERY OTHER DAY 45 tablet 1 Past Week   insulin  degludec (TRESIBA  FLEXTOUCH) 100 UNIT/ML FlexTouch Pen Inject 26 Units into the skin daily. 30 mL 3 05/23/2024   lisinopril  (ZESTRIL ) 10 MG tablet Take 1 tablet (10 mg total) by mouth daily. 90 tablet 1 05/23/2024   oxyCODONE -acetaminophen  (PERCOCET/ROXICET) 5-325 MG tablet Take 1 tablet by mouth 2 (two) times daily as needed for moderate pain.   Past Week   pregabalin  (LYRICA ) 300 MG capsule Take 600 mg by mouth at bedtime.   Past Week   Probiotic Product (MISC INTESTINAL FLORA REGULAT) CAPS Take 1 tablet by mouth daily.   Past Week   rosuvastatin  (CRESTOR ) 20 MG tablet TAKE 1  TABLET BY MOUTH EVERY DAY 90 tablet 1 Past Week   tirzepatide  (MOUNJARO ) 7.5 MG/0.5ML Pen Inject 7.5 mg into the skin once a week. 6 mL 3 Past Month   insulin  aspart (NOVOLOG  FLEXPEN) 100 UNIT/ML FlexPen INJECT 50 UNITS MAX DAILY (Patient taking differently: 8 Units 4 (four) times daily -  before meals and at bedtime. She uses 8-10 units before meals and then uses a sliding scale if her blood sugar spikes) 15 mL 0    Insulin  Pen Needle (TRUEPLUS 5-BEVEL PEN NEEDLES) 31G X 5 MM MISC Check blood sugar 4x times daily 400 each 2    Multiple Vitamin (MULTIVITAMIN WITH MINERALS) TABS tablet Take 1 tablet by mouth daily.       Patient Stressors: Traumatic event    Patient Strengths: Ability for insight  Active sense of humor  Capable of independent living  Motivation for treatment/growth  Physical Health  Special hobby/interest  Supportive family/friends   Treatment Modalities: Medication Management, Group therapy, Case management,  1 to 1 session with clinician, Psychoeducation, Recreational therapy.   Physician Treatment Plan for Primary Diagnosis: MDD (major depressive disorder), recurrent episode, severe (HCC) Long Term Goal(s):     Short Term Goals: Ability to identify changes in lifestyle to reduce recurrence of condition will improve Ability to verbalize feelings will improve Ability to disclose and discuss suicidal ideas Ability to demonstrate self-control will improve Ability to identify and develop effective coping behaviors will improve Ability to maintain clinical measurements within normal limits will improve Compliance with prescribed medications will improve Ability to identify triggers associated with substance abuse/mental health issues will improve  Medication Management: Evaluate patient's response, side effects, and tolerance of medication regimen.  Therapeutic Interventions: 1 to 1 sessions, Unit Group sessions and Medication administration.  Evaluation of Outcomes:  Not Progressing  Physician Treatment Plan for Secondary Diagnosis: Principal Problem:   MDD (major depressive disorder), recurrent episode, severe (HCC) Active Problems:   Suicidal ideation   PTSD (post-traumatic stress disorder)   GAD (generalized anxiety disorder)  Long Term Goal(s):     Short Term Goals: Ability to identify changes in lifestyle to reduce recurrence of condition will improve Ability to verbalize feelings will improve Ability to disclose and discuss suicidal ideas Ability to demonstrate self-control will improve Ability to identify and develop effective coping behaviors will improve Ability to maintain clinical measurements within normal limits will improve Compliance with prescribed medications will improve Ability  to identify triggers associated with substance abuse/mental health issues will improve     Medication Management: Evaluate patient's response, side effects, and tolerance of medication regimen.  Therapeutic Interventions: 1 to 1 sessions, Unit Group sessions and Medication administration.  Evaluation of Outcomes: Not Progressing   RN Treatment Plan for Primary Diagnosis: MDD (major depressive disorder), recurrent episode, severe (HCC) Long Term Goal(s): Knowledge of disease and therapeutic regimen to maintain health will improve  Short Term Goals: Ability to remain free from injury will improve, Ability to verbalize frustration and anger appropriately will improve, Ability to demonstrate self-control, Ability to participate in decision making will improve, Ability to verbalize feelings will improve, and Compliance with prescribed medications will improve  Medication Management: RN will administer medications as ordered by provider, will assess and evaluate patient's response and provide education to patient for prescribed medication. RN will report any adverse and/or side effects to prescribing provider.  Therapeutic Interventions: 1 on 1 counseling  sessions, Psychoeducation, Medication administration, Evaluate responses to treatment, Monitor vital signs and CBGs as ordered, Perform/monitor CIWA, COWS, AIMS and Fall Risk screenings as ordered, Perform wound care treatments as ordered.  Evaluation of Outcomes: Not Progressing   LCSW Treatment Plan for Primary Diagnosis: MDD (major depressive disorder), recurrent episode, severe (HCC) Long Term Goal(s): Safe transition to appropriate next level of care at discharge, Engage patient in therapeutic group addressing interpersonal concerns.  Short Term Goals: Engage patient in aftercare planning with referrals and resources, Increase social support, Increase ability to appropriately verbalize feelings, Increase emotional regulation, Facilitate acceptance of mental health diagnosis and concerns, and Increase skills for wellness and recovery  Therapeutic Interventions: Assess for all discharge needs, 1 to 1 time with Social worker, Explore available resources and support systems, Assess for adequacy in community support network, Educate family and significant other(s) on suicide prevention, Complete Psychosocial Assessment, Interpersonal group therapy.  Evaluation of Outcomes: Not Progressing   Progress in Treatment: Attending groups: Yes. Participating in groups: Yes. Taking medication as prescribed: Yes. Toleration medication: Yes. Family/Significant other contact made: No, will contact:  Donna Kidd (spouse) [phone] Patient understands diagnosis: Yes. Discussing patient identified problems/goals with staff: Yes. Medical problems stabilized or resolved: Yes. Denies suicidal/homicidal ideation: Yes. Issues/concerns per patient self-inventory: No.  Patient Goals:  learn different coping skills  Discharge Plan or Barriers: Patient will likely discharge home once stable.   Reason for Continuation of Hospitalization: Depression Medication stabilization  Estimated Length of Stay:  3-5 days  Last 3 [place] Suicide Severity Risk Score: Flowsheet Row Admission (Current) [facility] from 05/23/2024 in [facility] from 05/16/2024 in [facility])  C-SSRS RISK CATEGORY No Risk High Risk No Risk    Last PHQ 2/9 Scores:    03/16/2024   11:51 AM 12/26/2023    2:13 PM 10/29/2023    9:59 AM  Depression screen PHQ 2/9  Decreased Interest 1 1 0  Down, Depressed, Hopeless 1 1 0  PHQ - 2 Score 2 2 0  Altered sleeping 2 1 2   Tired, decreased energy 2 2 2   Change in appetite 2 0 1  Feeling bad or failure about yourself  1 0 0  Trouble concentrating 0 0 0  Moving slowly or fidgety/restless 0 0 0  Suicidal thoughts 0 0 0  PHQ-9 Score 9 5 5   Difficult doing work/chores Somewhat difficult  Not difficult at all  Scribe for Treatment Team: Tavita Eastham M Shalom Mcguiness, [place] 05/25/2024 1:26 PM

## 2024-05-25 NOTE — [facility] Suicide Risk Assessment (Signed)
 [facility] INPATIENT:  Family/Significant Other Suicide Prevention Education  Suicide Prevention Education:  Education Completed; Donna Kidd (spouse) [phone],  (name of family member/significant other) has been identified by the patient as the family member/significant other with whom the patient will be residing, and identified as the person(s) who will aid the patient in the event of a mental health crisis (suicidal ideations/suicide attempt).  With written consent from the patient, the family member/significant other has been provided the following suicide prevention education, prior to the and/or following the discharge of the patient.  The suicide prevention education provided includes the following: Suicide risk factors Suicide prevention and interventions National Suicide Hotline telephone number [facility] assessment telephone number [facility] Emergency Assistance 911 [facility] and/or Residential Mobile Crisis Unit telephone number  Request made of family/significant other to: Remove weapons (e.g., guns, rifles, knives), all items previously/currently identified as safety concern.   Remove drugs/medications (over-the-counter, prescriptions, illicit drugs), all items previously/currently identified as a safety concern.  Donna Kidd states Donna Kidd has been increasingly stressed the last 3-4 months. Donna Kidd states that Donna Kidd has never experienced a situation such as this one and that things are rather calm at home for the most part. Donna Kidd agrees Donna Kidd could benefit from a mental health resource such as therapy.   Donna Kidd agrees to lock and store weapons and medications away from patient's access. Donna Kidd has no concerns regarding Donna Kidd's return home and can provide transportation upon discharge.   The family member/significant other verbalizes understanding of the suicide prevention education information provided.  The family member/significant other agrees to remove the  items of safety concern listed above.  Donna Kidd 05/25/2024, 1:37 PM

## 2024-05-26 DIAGNOSIS — R45851 Suicidal ideations: Secondary | ICD-10-CM | POA: Diagnosis not present

## 2024-05-26 DIAGNOSIS — F411 Generalized anxiety disorder: Secondary | ICD-10-CM | POA: Diagnosis not present

## 2024-05-26 DIAGNOSIS — F4310 Post-traumatic stress disorder, unspecified: Secondary | ICD-10-CM | POA: Diagnosis not present

## 2024-05-26 DIAGNOSIS — F332 Major depressive disorder, recurrent severe without psychotic features: Secondary | ICD-10-CM | POA: Diagnosis not present

## 2024-05-26 LAB — GLUCOSE, CAPILLARY
Glucose-Capillary: 311 mg/dL — ABNORMAL HIGH (ref 70–99)
Glucose-Capillary: 251 mg/dL — ABNORMAL HIGH (ref 70–99)
Glucose-Capillary: 322 mg/dL — ABNORMAL HIGH (ref 70–99)
Glucose-Capillary: 234 mg/dL — ABNORMAL HIGH (ref 70–99)
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
## 2024-05-26 NOTE — Progress Notes (Signed)
(  Sleep Hours) -  (Any PRNs that were needed, meds refused, or side effects to meds)-   (Any disturbances and when (visitation, over night)-  (Concerns raised by the patient)-   (SI/HI/AVH)-

## 2024-05-26 NOTE — Group Note (Signed)
 Date:  05/26/2024 Time:  9:13 AM  Group Topic/Focus:  Goals Group:   The focus of this group is to help patients establish daily goals to achieve during treatment and discuss how the patient can incorporate goal setting into their daily lives to aide in recovery.    Participation Level:  Active  Participation Quality:  Appropriate  Affect:  Appropriate  Cognitive:  Appropriate  Insight: Appropriate  Engagement in Group:  Engaged  Modes of Intervention:  Discussion  Additional Comments:    Donna Kidd R Chairty Toman 05/26/2024, 9:13 AM

## 2024-05-26 NOTE — Plan of Care (Signed)
   Problem: Education: Goal: Knowledge of [facility] Education information/materials will improve Outcome: Progressing Goal: Emotional status will improve Outcome: Progressing Goal: Mental status will improve Outcome: Progressing Goal: Verbalization of understanding the information provided will improve Outcome: Progressing   Problem: Coping: Goal: Ability to verbalize frustrations and anger appropriately will improve Outcome: Progressing Goal: Ability to demonstrate self-control will improve Outcome: Progressing

## 2024-05-26 NOTE — Plan of Care (Signed)
   Problem: Education: Goal: Emotional status will improve Outcome: Progressing Goal: Mental status will improve Outcome: Progressing Goal: Verbalization of understanding the information provided will improve Outcome: Progressing

## 2024-05-26 NOTE — Plan of Care (Signed)
   Problem: Activity: Goal: Interest or engagement in activities will improve Outcome: Progressing   Problem: Coping: Goal: Ability to verbalize frustrations and anger appropriately will improve Outcome: Progressing   Problem: Safety: Goal: Periods of time without injury will increase Outcome: Progressing

## 2024-05-26 NOTE — Group Note (Signed)
 Date:  05/26/2024 Time:  8:54 PM  Group Topic/Focus:  Wrap-Up Group:   The focus of this group is to help patients review their daily goal of treatment and discuss progress on daily workbooks.    Participation Level:  Active  Participation Quality:  Appropriate and Sharing  Affect:  Appropriate  Cognitive:  Appropriate  Insight: Appropriate  Engagement in Group:  Engaged  Modes of Intervention:  Activity and Socialization  Additional Comments:  Patient activity engaged in wrap up group and shared. Patient shared with group, something good that happened today, something they are looking for to, and a challenge they faced today. Patient also participated in ice breaker group activity after sharing.   Eward Mace 05/26/2024, 8:54 PM

## 2024-05-26 NOTE — Group Note (Signed)
 LCSW Group Therapy Note   Group Date: 05/26/2024 Start Time: 1100 End Time: 1200   Participation:  patient was present and actively participated in the discussion.  She was insightful.    Type of Therapy:  Group Therapy  Topic:  Healing of the Hearts:  A Safe Space for Grief   Objective:  The objective of this class, Healing Hearts: A Safe Space for Grief, is to create a compassionate environment where participants can process their grief, explore different stages of grief, and discover ways to honor their loved ones through personal rituals.  Goals: Provide a safe and supportive space where participants feel comfortable sharing their feelings and experiences of grief without judgment. Educate participants about the stages of grief and emphasize that there is no right way to grieve or a fixed timeline for healing. Introduce the concept of rituals as a means to process grief, allowing individuals to honor their loved ones in a personal and meaningful way.  Summary:  In Healing Hearts: A Safe Space for Grief, we explored the unique and personal journey of grief, emphasizing that everyone experiences it differently. We discussed the five stages of grief (denial, anger, bargaining, depression, and acceptance), with the understanding that grief is not linear. Rituals were introduced as a way to help cope with loss, offering comfort and connection through meaningful actions such as lighting candles or taking memory walks. Participants were encouraged to express their emotions, focus on self-care, and reflect on moments of gratitude for their loved ones, recognizing that healing is a process and there is no timeline for grief.  Therapeutic Modalities: - Elements of CBT:  Cognitive Restructuring - Elements of DBT:  Distress Tolerance, Emotion Regulation   Sharnell Knight O Zayne Draheim, LCSWA 05/26/2024  12:15 PM

## 2024-05-26 NOTE — Progress Notes (Signed)
 Tour of Duty:  Ronnald CHRISTELLA Boyer, RN, 05/26/24, Tour of Duty: 0700-1900  SI/HI/AVH: Denies  Self-Reported   Mood: Neutral  Anxiety: Endorses Depression: Denies, but Observable Irritability: Denies, but Observable  Broset  Violence Prevention Guidelines *See Row Information*: Small Violence Risk interventions implemented    Pain: not present  Patient Refusals (including Rx): No  >>Shift Summary:   Last Vitals  Vitals Weight: 112.5 kg Temp: (!) 97.4 F (36.3 C) Temp Source: Oral Pulse Rate: 86 Resp: 16 BP: (!) 154/71 Patient Position: (not recorded)  Admission Type  Psych Admission Type (Psych Patients Only) Admission Status: Involuntary Date 72 hour document signed : (not recorded) Time 72 hour document signed : (not recorded) Provider Notified (First and Last Name) (see details for LINK to note): (not recorded)   Psychosocial Assessment  Psychosocial Assessment Patient Complaints: None Eye Contact: Fair Facial Expression: Animated Affect: Sad Speech: Logical/coherent Interaction: Assertive Motor Activity: Unsteady (Prostetic Leg) Appearance/Hygiene: Unremarkable Behavior Characteristics: Cooperative Mood: Depressed   Aggressive Behavior  Targets: (not recorded)   Thought Process  Thought Process Coherency: Within Defined Limits Content: Within Defined Limits Delusions: None reported or observed Perception: Within Defined Limits Hallucination: None reported or observed Judgment: Limited Confusion: None  Danger to Self/Others  Danger to Self Current suicidal ideation?: Denies Description of Suicide Plan: (not recorded) Self-Injurious Behavior: (not recorded) Agreement Not to Harm Self: Yes Description of Agreement: Verbal Danger to Others: None reported or observed

## 2024-05-27 ENCOUNTER — Ambulatory Visit: Admitting: Internal Medicine

## 2024-05-27 DIAGNOSIS — F332 Major depressive disorder, recurrent severe without psychotic features: Secondary | ICD-10-CM | POA: Diagnosis not present

## 2024-05-27 DIAGNOSIS — R45851 Suicidal ideations: Secondary | ICD-10-CM | POA: Diagnosis not present

## 2024-05-27 DIAGNOSIS — F411 Generalized anxiety disorder: Secondary | ICD-10-CM | POA: Diagnosis not present

## 2024-05-27 DIAGNOSIS — F4310 Post-traumatic stress disorder, unspecified: Secondary | ICD-10-CM | POA: Diagnosis not present

## 2024-05-27 LAB — GLUCOSE, CAPILLARY
Glucose-Capillary: 188 mg/dL — ABNORMAL HIGH (ref 70–99)
Glucose-Capillary: 297 mg/dL — ABNORMAL HIGH (ref 70–99)
Glucose-Capillary: 291 mg/dL — ABNORMAL HIGH (ref 70–99)
Glucose-Capillary: 225 mg/dL — ABNORMAL HIGH (ref 70–99)

## 2024-05-27 NOTE — Inpatient Diabetes Management (Signed)
 Inpatient Diabetes Program Recommendations  AACE/ADA: New Consensus Statement on Inpatient Glycemic Control   Target Ranges:  Prepandial:   less than 140 mg/dL      Peak postprandial:   less than 180 mg/dL (1-2 hours)      Critically ill patients:  140 - 180 mg/dL    Latest Reference Range & Units 05/26/24 06:18 05/26/24 12:06 05/26/24 17:24 05/26/24 21:01 05/27/24 05:51  Glucose-Capillary 70 - 99 mg/dL 688 (H) 748 (H) 677 (H) 234 (H) 188 (H)    Review of Glycemic Control  Diabetes history: DM2 Outpatient Diabetes medications: Tresiba  26 units daily, Novolog  8-10 units TID with meals, Mounjaro  7.5 mg Qweek Current orders for Inpatient glycemic control: Lantus  26 units daily, Novolog  10 units TID with meals, Novolog  0-9 units TID with meals, Novolog  0-5 units QHS  Inpatient Diabetes Program Recommendations:    Insulin : Please consider increasing Lantus  to 28 units daily and meal coverage to Novolog  12 units TID with meals.  Thanks, Earnie Gainer, RN, MSN, CDCES Diabetes Coordinator Inpatient Diabetes Program [phone] (Team Pager from 8am to 5pm)

## 2024-05-27 NOTE — Group Note (Signed)
 Recreation Therapy Group Note   Group Topic:Other  Group Date: 05/27/2024 Start Time: 0930 End Time: 1000 Facilitators: Seynabou Fults-McCall, LRT,CTRS Location: [address] Dayroom   Group Topic: Communication, Problem Solving   Goal Area(s) Addresses:  Patient will effectively listen to complete activity.  Patient will identify communication skills used to make activity successful.  Patient will identify how skills used during activity can be used to reach post d/c goals.    Intervention: Building surveyor Activity - Geometric pattern cards, pencils, blank paper    Activity: Geometric Drawings.  Three volunteers from the peer group will be shown an abstract picture with a particular arrangement of geometrical shapes.  Each round, one 'speaker' will describe the pattern, as accurately as possible without revealing the image to the group.  The remaining group members will listen and draw the picture to reflect how it is described to them. Patients with the role of 'listener' cannot ask clarifying questions but, may request that the speaker repeat a direction. Once the drawings are complete, the presenter will show the rest of the group the picture and compare how close each person came to drawing the picture. LRT will facilitate a post-activity discussion regarding effective communication and the importance of planning, listening, and asking for clarification in daily interactions with others.  Education: Environmental consultant, Active listening, Support systems, Discharge planning  Education Outcome: Acknowledges understanding/In group clarification offered/Needs additional education.    Affect/Mood: N/A   Participation Level: N/A    Clinical Observations/Individualized Feedback: Recreation therapy group session did not occur due to previous going time.     Plan: Continue to engage patient in RT group sessions 2-3x/week.   Dusten Ellinwood-McCall, LRT,CTRS 05/27/2024 1:23 PM

## 2024-05-27 NOTE — Progress Notes (Addendum)
 Patient denies SI/HI/AVH this morning. Pt rates her depression a 0/10 and anxiety a 0/10. Pt reports that she slept good last night. Pt reports that her goal for today is to not feel so guilty for my actions. Pt complains of a headache this morning, rating it a 5/10. Pt refuses pain medication for headache at this time. Pt has been interactive on the unit and participating in groups throughout the day. Pt has been calm and cooperative throughout the day. Patient has been compliant with medications and treatment plan. Q 15 minute safety checks are in place for patient's safety. Patient is currently safe on the unit.   05/27/24 0848  Psych Admission Type (Psych Patients Only)  Admission Status Involuntary  Psychosocial Assessment  Patient Complaints None  Eye Contact Fair  Facial Expression Animated  Affect Appropriate to circumstance  Speech Logical/coherent  Interaction Assertive  Motor Activity Other (Comment) (WDL)  Appearance/Hygiene Unremarkable  Behavior Characteristics Cooperative;Appropriate to situation  Mood Pleasant  Thought Process  Coherency WDL  Content WDL  Delusions None reported or observed  Perception WDL  Hallucination None reported or observed  Judgment Impaired  Confusion None  Danger to Self  Current suicidal ideation? Denies  Agreement Not to Harm Self Yes  Description of Agreement Pt verbally contracts for safety  Danger to Others  Danger to Others None reported or observed

## 2024-05-27 NOTE — Progress Notes (Addendum)

## 2024-05-27 NOTE — Plan of Care (Signed)
   Problem: Education: Goal: Knowledge of [facility] Education information/materials will improve Outcome: Progressing Goal: Emotional status will improve Outcome: Progressing Goal: Mental status will improve Outcome: Progressing Goal: Verbalization of understanding the information provided will improve Outcome: Progressing

## 2024-05-27 NOTE — Group Note (Signed)
 Date:  05/27/2024 Time:  11:10 AM  Group Topic/Focus:  Goals Group:   The focus of this group is to help patients establish daily goals to achieve during treatment and discuss how the patient can incorporate goal setting into their daily lives to aide in recovery.    Participation Level:  Active  Participation Quality:  Attentive  Affect:  Appropriate  Cognitive:  Alert  Insight: Good  Engagement in Group:  Engaged  Modes of Intervention:  Education  Additional Comments:  [name], wants to know how to forgive herself for letting down her children and family.   [name] 05/27/2024, 11:10 AM

## 2024-05-27 NOTE — Group Note (Unsigned)
 Date:  05/27/2024 Time:  10:28 AM  Group Topic/Focus:  Goals Group:   The focus of this group is to help patients establish daily goals to achieve during treatment and discuss how the patient can incorporate goal setting into their daily lives to aide in recovery.     Participation Level:  {BHH PARTICIPATION OZCZO:77735}  Participation Quality:  {BHH PARTICIPATION QUALITY:22265}  Affect:  {BHH AFFECT:22266}  Cognitive:  {BHH COGNITIVE:22267}  Insight: {BHH Insight2:20797}  Engagement in Group:  {BHH ENGAGEMENT IN HMNLE:77731}  Modes of Intervention:  {BHH MODES OF INTERVENTION:22269}  Additional Comments:  ***  Donna Kidd 05/27/2024, 10:28 AM
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
## 2024-05-27 NOTE — Group Note (Signed)
 Date:  05/27/2024 Time:  3:37 PM  Group Topic/Focus: Sleep Hygiene Dimensions of Wellness:   The focus of this group is to introduce the topic of wellness and discuss the role each dimension of wellness plays in total health.    Participation Level:  Active  Participation Quality:  Appropriate  Affect:  Appropriate  Cognitive:  Appropriate  Insight: Appropriate  Engagement in Group:  Engaged  Modes of Intervention:  Discussion  Additional Comments:  Pt was engaged appropriately in group.  Jonatan Wilsey D Thedora Rings 05/27/2024, 3:37 PM

## 2024-05-27 NOTE — Group Note (Signed)
 Date:  05/27/2024 Time:  9:02 PM  Group Topic/Focus:  AA/NA Group    Participation Level:  Active  Participation Quality:  Appropriate  Affect:  Appropriate  Cognitive:  Appropriate  Insight: Appropriate  Engagement in Group:  Engaged  Modes of Intervention:  Discussion  Additional Comments:  Patient attended AA/NA meeting   Bari Moats 05/27/2024, 9:02 PM

## 2024-05-28 ENCOUNTER — Telehealth: Payer: Self-pay

## 2024-05-28 DIAGNOSIS — F411 Generalized anxiety disorder: Secondary | ICD-10-CM

## 2024-05-28 DIAGNOSIS — R45851 Suicidal ideations: Secondary | ICD-10-CM

## 2024-05-28 DIAGNOSIS — F4310 Post-traumatic stress disorder, unspecified: Secondary | ICD-10-CM | POA: Diagnosis not present

## 2024-05-28 DIAGNOSIS — F332 Major depressive disorder, recurrent severe without psychotic features: Principal | ICD-10-CM

## 2024-05-28 LAB — GLUCOSE, CAPILLARY: Glucose-Capillary: 202 mg/dL — ABNORMAL HIGH (ref 70–99)

## 2024-05-28 MED ORDER — HYDROXYZINE HCL 10 MG PO TABS: 10.0000 mg | ORAL_TABLET | Freq: Every day | ORAL | 0 refills | Status: AC

## 2024-05-28 MED ORDER — ESCITALOPRAM OXALATE 10 MG PO TABS: 10.0000 mg | ORAL_TABLET | Freq: Every day | ORAL | 0 refills | Status: AC

## 2024-05-28 NOTE — Progress Notes (Signed)
   05/28/24 0800  Psych Admission Type (Psych Patients Only)  Admission Status Involuntary  Psychosocial Assessment  Patient Complaints None  Eye Contact Fair  Facial Expression Animated  Affect Appropriate to circumstance  Speech Logical/coherent  Interaction Assertive  Motor Activity Other (Comment) (wdl)  Appearance/Hygiene Unremarkable  Behavior Characteristics Cooperative;Appropriate to situation  Mood Pleasant  Thought Process  Coherency WDL  Content WDL  Delusions None reported or observed  Perception WDL  Hallucination None reported or observed  Judgment Poor  Confusion None  Danger to Self  Current suicidal ideation? Denies  Agreement Not to Harm Self Yes  Description of Agreement Pt. verbally contracts for safety  Danger to Others  Danger to Others None reported or observed

## 2024-05-28 NOTE — [facility] Suicide Risk Assessment (Signed)
 [facility] Discharge Suicide Risk Assessment   Principal Problem: MDD (major depressive disorder), recurrent episode, severe (HCC) Discharge Diagnoses: Principal Problem:   MDD (major depressive disorder), recurrent episode, severe (HCC) Active Problems:   Suicidal ideation   PTSD (post-traumatic stress disorder)   GAD (generalized anxiety disorder)    Total Time spent with patient: 15 min   During the patient's hospitalization, patient had extensive initial psychiatric evaluation, and follow-up psychiatric evaluations every day.  Upon evaluation, psychiatric diagnoses were given as follows:  MDD, PTSD, GAD  Patient's psychiatric medications were adjusted on admission:  Discontinue Wellbutrin  Start Lexapro 10 mg daily  During the hospitalization, other adjustments were made to the patient's psychiatric medication regimen:  None  Gradually, patient started adjusting to milieu.   Patient's care was discussed during the interdisciplinary team meeting every day during the hospitalization.  The patient denied having side effects to prescribed psychiatric medication.  The patient reports their target psychiatric symptoms responded well to the psychiatric medications, and the patient reports overall benefit other psychiatric hospitalization. Supportive psychotherapy was provided to the patient. The patient also participated in regular group therapy while admitted.   Labs were reviewed with the patient, and abnormal results were discussed with the patient.  The patient denied having suicidal thoughts more than 48 hours prior to discharge.  Patient denies having homicidal thoughts.  Patient denies having auditory hallucinations.  Patient denies any visual hallucinations.  Patient denies having paranoid thoughts.  The patient is able to verbalize their individual safety plan to this provider.  It is recommended to the patient to continue psychiatric medications as prescribed, after  discharge from the hospital.    It is recommended to the patient to follow up with your outpatient psychiatric provider and PCP.  Discussed with the patient, the impact of alcohol, drugs, tobacco have been there overall psychiatric and medical wellbeing, and total abstinence from substance use was recommended the patient.    Psychiatric Specialty Exam: Physical Exam Constitutional:      Appearance: the patient is not toxic-appearing.  Pulmonary:     Effort: Pulmonary effort is normal.  Neurological:     General: No focal deficit present.     Mental Status: the patient is alert and oriented to person, place, and time.   Review of Systems  Respiratory:  Negative for shortness of breath.   Cardiovascular:  Negative for chest pain.  Gastrointestinal:  Negative for abdominal pain, constipation, diarrhea, nausea and vomiting.  Neurological:  Negative for headaches.      BP (!) 147/72 (BP Location: Left Arm)   Pulse 77   Temp (!) 97.4 F (36.3 C) (Oral)   Resp 16   Ht 5' 8 (1.727 m)   Wt 112.5 kg   LMP 01/26/2016   SpO2 98%   BMI 37.71 kg/m   General Appearance: Fairly Groomed  Eye Contact:  Good  Speech:  Clear and Coherent  Volume:  Normal  Mood:  Euthymic  Affect:  Congruent  Thought Process:  Coherent  Orientation:  Full (Time, Place, and Person)  Thought Content: Logical   Suicidal Thoughts:  No  Homicidal Thoughts:  No  Memory:  Immediate;   Good  Judgement:  fair  Insight:  fair  Psychomotor Activity:  Normal  Concentration:  Concentration: Good  Recall:  Good  Fund of Knowledge: Good  Language: Good  Akathisia:  No  Handed:  not assessed  AIMS (if indicated): not done  Assets:  Communication Skills Desire for Improvement  Financial Resources/Insurance Housing Leisure Time Physical Health  ADL's:  Intact  Cognition: WNL  Sleep:  Fair     Mental Status Per Nursing Assessment::   On Admission:  NA  Demographic Factors:  NA  Loss  Factors: NA  Historical Factors: NA  Risk Reduction Factors:   Positive social support Coping skills Good therapeutic relationship  Continued Clinical Symptoms:  Depression   Cognitive Features That Contribute To Risk:  None  Suicide Risk:  Mild: Suicidal ideation of limited frequency, intensity, duration, and specificity.  There are no identifiable plans, no associated intent, mild dysphoria and related symptoms, good self-control (both objective and subjective assessment), few other risk factors, and identifiable protective factors, including available and accessible social support.   Follow-up Information     [facility] Follow up on 06/04/2024.   Why: You have a hospital follow up appointment for therapy and medication management services on 10/9 at 9:30 am.  The appointment will be Virtual telehealth. Contact information: [address]  [address] [place] [address] [phone]                 Plan Of Care/Follow-up recommendations:  Activity as tolerated. Diet as recommended by PCP. Keep all scheduled follow-up appointments as recommended.  Patient is instructed to take all prescribed medications as recommended. Report any side effects or adverse reactions to your outpatient psychiatrist. Patient is instructed to abstain from alcohol and illegal drugs while on prescription medications. In the event of worsening symptoms, patient is instructed to call the crisis hotline, 911, or go to the nearest emergency department for evaluation and treatment.  Prescriptions given at discharge. Patient agreeable to plan. Given opportunity to ask questions. Appears to feel comfortable with discharge.  Patient is also instructed prior to discharge to: Take all medications as prescribed by mental healthcare provider. Report any adverse effects and or reactions from the medicines to outpatient provider promptly. Patient has been instructed & cautioned: To not engage in alcohol  and or illegal drug use while on prescription medicines. In the event of worsening symptoms,  patient is instructed to call the crisis hotline, 911 and or go to the nearest ED for appropriate evaluation and treatment of symptoms. To follow-up with primary care provider for other medical issues, concerns and or health care needs  The patient was evaluated each day by a clinical provider to ascertain response to treatment. Improvement was noted by the patient's report of decreasing symptoms, improved sleep and appetite, affect, medication tolerance, behavior, and participation in unit programming.  Patient was asked each day to complete a self inventory noting mood, mental status, pain, new symptoms, anxiety and concerns.  Patient responded well to medication and being in a therapeutic and supportive environment. Positive and appropriate behavior was noted and the patient was motivated for recovery. The patient worked closely with the treatment team and case manager to develop a discharge plan with appropriate goals. Coping skills, problem solving as well as relaxation therapies were also part of the unit programming.  By the day of discharge patient was in much improved condition than upon admission.  Symptoms were reported as significantly decreased or resolved completely. The patient was motivated to continue taking medication with a goal of continued improvement in mental health.     Karleen Kaufmann, MD PGY-4

## 2024-05-28 NOTE — Progress Notes (Unsigned)
 Complex Care Management Note Care Guide Note  05/28/2024 Name: Donna Kidd MRN: [id number] DOB: March 14, 1966   Complex Care Management Outreach Attempts: An unsuccessful telephone outreach was attempted today to offer the patient information about available complex care management services.  Follow Up Plan:  No further outreach attempts will be made at this time. We have been unable to contact the patient to offer or enroll patient in complex care management services.  Encounter Outcome:  Patient Refused  Leotis Rase [facility], [facility] Guide  Direct Dial: [phone]  Fax [phone]
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
## 2024-05-28 NOTE — Plan of Care (Signed)
   Problem: Education: Goal: Knowledge of [facility] Education information/materials will improve Outcome: Progressing Goal: Emotional status will improve Outcome: Progressing Goal: Mental status will improve Outcome: Progressing Goal: Verbalization of understanding the information provided will improve Outcome: Progressing

## 2024-05-28 NOTE — Group Note (Signed)
 Date:  05/28/2024 Time:  9:47 AM  Group Topic/Focus:  Goals Group:   The focus of this group is to help patients establish daily goals to achieve during treatment and discuss how the patient can incorporate goal setting into their daily lives to aide in recovery.    Participation Level:  Active  Participation Quality:  Appropriate  Affect:  Appropriate  Cognitive:  Appropriate  Insight: Appropriate  Engagement in Group:  Engaged  Modes of Intervention:  Discussion and Education  Additional Comments:    Donna Kidd [name] 05/28/2024, 9:47 AM

## 2024-05-28 NOTE — Progress Notes (Signed)
 Patient discharged from [facility] on 05/28/24 at 1050. Patient denies SI, plan, and intention. Suicide safety plan completed, reviewed with this RN, given to the patient, and a copy in the chart. Patient denies HI/AVH upon discharge. Patient is alert, oriented, and cooperative. RN provided patient with discharge paperwork and reviewed information with patient. Patient expressed that she understood all of the discharge instructions. Pt was satisfied with belongings returned to her from the locker and at bedside. Discharged patient to [facility] waiting room. Pt's husband awaiting patient in the [facility] waiting room.

## 2024-05-28 NOTE — Progress Notes (Signed)
(  Sleep Hours) -7 (Any PRNs that were needed, meds refused, or side effects to meds)- none (Any disturbances and when (visitation, over night)- n/a (Concerns raised by the patient)- none (SI/HI/AVH)- denies

## 2024-05-28 NOTE — Plan of Care (Signed)
  Problem: Education: Goal: Knowledge of [facility] Education information/materials will improve Outcome: Progressing   Problem: Activity: Goal: Interest or engagement in activities will improve Outcome: Progressing   Problem: Coping: Goal: Ability to adjust to condition or change in health will improve Outcome: Progressing

## 2024-05-28 NOTE — Progress Notes (Signed)
  [facility] Adult Case Management Discharge Plan :  Will you be returning to the same living situation after discharge:  Yes,  patient is discharging to her home, address on file. At discharge, do you have transportation home?: Yes,  patient's husband will be providing transportation at 10:30 am.  Do you have the ability to pay for your medications: Yes,  patient has active health insurance.  Release of information consent forms completed and in the chart;  Patient's signature needed at discharge.  Patient to Follow up at:  Follow-up Information     [facility] Follow up on 06/04/2024.   Why: You have a hospital follow up appointment for therapy and medication management services on 10/9 at 9:30 am.  The appointment will be Virtual telehealth. Contact information: [address]  [address] [place] [address] [phone]                 Next level of care provider has access to [facility] Link:no  Safety Planning and Suicide Prevention discussed: Yes,  completed with Earlena Werst (husband) [phone].     Has patient been referred to the Quitline?: Patient does not use tobacco/nicotine products  Patient has been referred for addiction treatment: No known substance use disorder.  Louetta Lame, LCSWA 05/28/2024, 10:18 AM

## 2024-05-31 NOTE — Progress Notes (Signed)
 Spiritual care group on grief and loss facilitated by Chaplain Rockie Sofia, Bcc  Group Goal: Support / Education around grief and loss  Members engage in facilitated group support and psycho-social education.  Group Description:  Following introductions and group rules, group members engaged in facilitated group dialogue and support around topic of loss, with particular support around experiences of loss in their lives. Group Identified types of loss (relationships / self / things) and identified patterns, circumstances, and changes that precipitate losses. Reflected on thoughts / feelings around loss, normalized grief responses, and recognized variety in grief experience. Group encouraged individual reflection on safe space and on the coping skills that they are already utilizing.  Group drew on Adlerian / Rogerian and narrative framework  Patient Progress: Donna Kidd attended group and actively engaged and participated in group conversation and activities.

## 2024-06-01 DIAGNOSIS — H5211 Myopia, right eye: Secondary | ICD-10-CM | POA: Diagnosis not present

## 2024-06-07 DIAGNOSIS — Z419 Encounter for procedure for purposes other than remedying health state, unspecified: Secondary | ICD-10-CM | POA: Diagnosis not present

## 2024-06-10 DIAGNOSIS — F331 Major depressive disorder, recurrent, moderate: Secondary | ICD-10-CM | POA: Diagnosis not present

## 2024-06-16 DIAGNOSIS — F331 Major depressive disorder, recurrent, moderate: Secondary | ICD-10-CM | POA: Diagnosis not present

## 2024-06-19 ENCOUNTER — Other Ambulatory Visit (HOSPITAL_COMMUNITY): Payer: Self-pay | Admitting: Student

## 2024-06-19 ENCOUNTER — Other Ambulatory Visit (HOSPITAL_COMMUNITY): Payer: Self-pay | Admitting: Psychiatry

## 2024-06-19 NOTE — Telephone Encounter (Signed)
 Received a refill request for this patient. I saw her during her IP psychiatric hospitalization in which patients typically receive a 30 day script of the medications with the plan to followup up with an outpatient psychiatrist afterwards who will be the one in charge of refilling the prescriptions.

## 2024-06-23 DIAGNOSIS — F4312 Post-traumatic stress disorder, chronic: Secondary | ICD-10-CM | POA: Diagnosis not present

## 2024-06-23 DIAGNOSIS — F411 Generalized anxiety disorder: Secondary | ICD-10-CM | POA: Diagnosis not present

## 2024-07-08 DIAGNOSIS — Z419 Encounter for procedure for purposes other than remedying health state, unspecified: Secondary | ICD-10-CM | POA: Diagnosis not present

## 2024-07-14 ENCOUNTER — Encounter: Payer: Self-pay | Admitting: Internal Medicine

## 2024-07-14 ENCOUNTER — Ambulatory Visit: Admitting: Internal Medicine

## 2024-07-14 VITALS — BP 168/80 | HR 88 | Ht 68.0 in | Wt 253.0 lb

## 2024-07-14 DIAGNOSIS — E1165 Type 2 diabetes mellitus with hyperglycemia: Secondary | ICD-10-CM | POA: Diagnosis not present

## 2024-07-14 DIAGNOSIS — Z89512 Acquired absence of left leg below knee: Secondary | ICD-10-CM

## 2024-07-14 DIAGNOSIS — Z794 Long term (current) use of insulin: Secondary | ICD-10-CM | POA: Diagnosis not present

## 2024-07-14 DIAGNOSIS — E1142 Type 2 diabetes mellitus with diabetic polyneuropathy: Secondary | ICD-10-CM | POA: Diagnosis not present

## 2024-07-14 MED ORDER — TRUEPLUS 5-BEVEL PEN NEEDLES 31G X 5 MM MISC: 1 refills | Status: AC

## 2024-07-14 MED ORDER — DEXCOM G7 SENSOR MISC: 1.0000 | 1 refills | Status: AC

## 2024-07-14 MED ORDER — TRESIBA FLEXTOUCH 100 UNIT/ML ~~LOC~~ SOPN: 30.0000 [IU] | PEN_INJECTOR | Freq: Every day | SUBCUTANEOUS | 1 refills | Status: AC

## 2024-07-14 MED ORDER — NOVOLOG FLEXPEN 100 UNIT/ML ~~LOC~~ SOPN: PEN_INJECTOR | SUBCUTANEOUS | 1 refills | Status: AC

## 2024-07-14 MED ORDER — TIRZEPATIDE 7.5 MG/0.5ML ~~LOC~~ SOAJ: 7.5000 mg | SUBCUTANEOUS | 1 refills | Status: AC

## 2024-07-14 NOTE — Patient Instructions (Addendum)
 Continue Mounjaro  7.5 mg once weeky  Increase Tresiba  30 units daily Take Novolog  14 units Before each meal  Take NovoLog  6 units with a snack  Novolog  correctional insulin : ADD extra units on insulin  to your meal-time Novolog  dose if your blood sugars are higher than 160. Use the scale below to help guide you before each meal and bedtime   Blood sugar before meal Number of units to inject  Less than 160 0 unit  161 -  190 1 units  191 -  220 2 units  221 -  250 3 units  251 -  280 4 units  281 -  310 5 units  311 -  340 6 units  341 -  370 7 units  371 -  400 8 units      HOW TO TREAT LOW BLOOD SUGARS (Blood sugar LESS THAN 70 MG/DL) Please follow the RULE OF 15 for the treatment of hypoglycemia treatment (when your (blood sugars are less than 70 mg/dL)   STEP 1: Take 15 grams of carbohydrates when your blood sugar is low, which includes:  3-4 GLUCOSE TABS  OR 3-4 OZ OF JUICE OR REGULAR SODA OR ONE TUBE OF GLUCOSE GEL    STEP 2: RECHECK blood sugar in 15 MINUTES STEP 3: If your blood sugar is still low at the 15 minute recheck --> then, go back to STEP 1 and treat AGAIN with another 15 grams of carbohydrates.
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
## 2024-07-30 ENCOUNTER — Ambulatory Visit: Payer: Self-pay | Admitting: *Deleted

## 2024-07-30 ENCOUNTER — Ambulatory Visit (INDEPENDENT_AMBULATORY_CARE_PROVIDER_SITE_OTHER): Admitting: Family Medicine

## 2024-07-30 ENCOUNTER — Encounter: Payer: Self-pay | Admitting: Family Medicine

## 2024-07-30 VITALS — BP 134/80 | HR 80 | Temp 97.8°F | Ht 68.0 in | Wt 259.2 lb

## 2024-07-30 DIAGNOSIS — J069 Acute upper respiratory infection, unspecified: Secondary | ICD-10-CM | POA: Diagnosis not present

## 2024-07-30 MED ORDER — DEXTROMETHORPHAN HBR 15 MG/5ML PO SYRP: 10.0000 mL | ORAL_SOLUTION | Freq: Three times a day (TID) | ORAL | 0 refills | Status: AC | PRN

## 2024-07-30 MED ORDER — ALBUTEROL SULFATE HFA 108 (90 BASE) MCG/ACT IN AERS: 2.0000 | INHALATION_SPRAY | Freq: Four times a day (QID) | RESPIRATORY_TRACT | 0 refills | Status: AC | PRN

## 2024-07-30 NOTE — Telephone Encounter (Signed)
 FYI Only or Action Required?: FYI only for provider: appointment scheduled on 07/30/24.  Patient was last seen in primary care on 03/16/2024 by Kayla Jeoffrey RAMAN, FNP.  Called Nurse Triage reporting Cough.  Symptoms began several days ago.  Interventions attempted: OTC medications: tylenol  cold / flu and Rest, hydration, or home remedies.  Symptoms are: gradually worsening.  Triage Disposition: See HCP Within 4 Hours (Or PCP Triage)  Patient/caregiver understands and will follow disposition?: Yes              Copied from CRM [id number]. Topic: Clinical - Red Word Triage >> Jul 30, 2024  8:49 AM Donna Kidd wrote: Congested in head and feels it going to her chest, and sore throat with a temp of 102.3 and very fatigue with a bad cough. Cough wakes her up in the night has to sleep sitting up. This started over the weekend. Reason for Disposition  [1] Fever > 100 F (37.8 C) AND [2] diabetes mellitus or weak immune system (e.g., HIV positive, cancer chemo, splenectomy, organ transplant, chronic steroids)  Answer Assessment - Initial Assessment Questions Appt today       1. ONSET: When did the cough begin?      Friday nasal congestion , cough started Saturday  2. SEVERITY: How bad is the cough today?      Getting worse but non productive  3. SPUTUM: Describe the color of your sputum (e.g., none, dry cough; clear, white, yellow, green)     Nothing  4. HEMOPTYSIS: Are you coughing up any blood? If Yes, ask: How much? (e.g., flecks, streaks, tablespoons, etc.)     na 5. DIFFICULTY BREATHING: Are you having difficulty breathing? If Yes, ask: How bad is it? (e.g., mild, moderate, severe)      None  6. FEVER: Do you have a fever? If Yes, ask: What is your temperature, how was it measured, and when did it start?     100.2 last night  7. CARDIAC HISTORY: Do you have any history of heart disease? (e.g., heart attack, congestive heart failure)      Na  8. LUNG  HISTORY: Do you have any history of lung disease?  (e.g., pulmonary embolus, asthma, emphysema)     Hx bronchitis  9. PE RISK FACTORS: Do you have a history of blood clots? (or: recent major surgery, recent prolonged travel, bedridden)     Na  10. OTHER SYMPTOMS: Do you have any other symptoms? (e.g., runny nose, wheezing, chest pain)       Sore throat, non productive cough , deep breathing feels between boobs. Chest tightness, nasal congestion clear mucus , fever, fatigue left ear pain  11. PREGNANCY: Is there any chance you are pregnant? When was your last menstrual period?       na 12. TRAVEL: Have you traveled out of the country in the last month? (e.g., travel history, exposures)       na  Protocols used: Cough - Acute Non-Productive-A-AH
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
## 2024-07-31 ENCOUNTER — Other Ambulatory Visit: Payer: Self-pay | Admitting: Family Medicine

## 2024-07-31 ENCOUNTER — Ambulatory Visit: Payer: Self-pay

## 2024-07-31 ENCOUNTER — Ambulatory Visit

## 2024-07-31 MED ORDER — HYDROCODONE BIT-HOMATROP MBR 5-1.5 MG/5ML PO SOLN: 5.0000 mL | Freq: Three times a day (TID) | ORAL | 0 refills | Status: AC | PRN

## 2024-07-31 NOTE — Telephone Encounter (Signed)
 FYI Only or Action Required?: Action required by provider: clinical question for provider and update on patient condition.  Patient was last seen in primary care on 07/30/2024 by Kayla Jeoffrey RAMAN, FNP.  Called Nurse Triage reporting Cough.  Symptoms began several days ago.  Interventions attempted: Prescription medications: See MAR and Rest, hydration, or home remedies.  Symptoms are: gradually worsening.  Triage Disposition: See Physician Within 24 Hours  Patient/caregiver understands and will follow disposition?: No, wishes to speak with PCP  Copied from CRM #[id number]. Topic: Clinical - Red Word Triage >> Jul 31, 2024  9:35 AM Treva T wrote: [facility] that prompted transfer to Nurse Triage: Pt calling, reports she was seen yesterday for cough, but today she has worsening productive cough, with colored green mucous, states she is coughing so hard her ribs hurt.  Pt reports she has been taking Mucinex  as recommended by provider, but it is not helping, cough is worsening, pain in chest when breathing, with increased chest and head congestion.  Pt would like to know if needs to come back in for appt., or for new medication.   Ph. [phone] Reason for Disposition  SEVERE coughing spells (e.g., whooping sound after coughing, vomiting after coughing)  Answer Assessment - Initial Assessment Questions Patient was seen yesterday in the office for cough. Reports increased worsening cough with green mucous. States coughing so hard that her ribs are hurting. Increased chest and head congestion. No appointments per decision tree. Patient is asking for a call back for any additional recommendations or if she is able to be reevaluated today in office.    1. ONSET: When did the cough begin?      Started last Friday 2. SEVERITY: How bad is the cough today?      severe 3. SPUTUM: Describe the color of your sputum (e.g., none, dry cough; clear, white, yellow, green)     green 4. HEMOPTYSIS:  Are you coughing up any blood? If Yes, ask: How much? (e.g., flecks, streaks, tablespoons, etc.)     no 5. DIFFICULTY BREATHING: Are you having difficulty breathing? If Yes, ask: How bad is it? (e.g., mild, moderate, severe)      Patient doesn't think she is short of breath 6. FEVER: Do you have a fever? If Yes, ask: What is your temperature, how was it measured, and when did it start?     no 7. CARDIAC HISTORY: Do you have any history of heart disease? (e.g., heart attack, congestive heart failure)      no 8. LUNG HISTORY: Do you have any history of lung disease?  (e.g., pulmonary embolus, asthma, emphysema)     bronchitis 9. PE RISK FACTORS: Do you have a history of blood clots? (or: recent major surgery, recent prolonged travel, bedridden)     Yes-06/2021 10. OTHER SYMPTOMS: Do you have any other symptoms? (e.g., runny nose, wheezing, chest pain)       Head and nasal congestion 12. TRAVEL: Have you traveled out of the country in the last month? (e.g., travel history, exposures)       no  Protocols used: Cough - Acute Productive-A-AH

## 2024-08-02 DIAGNOSIS — J0191 Acute recurrent sinusitis, unspecified: Secondary | ICD-10-CM | POA: Diagnosis not present

## 2024-08-02 DIAGNOSIS — J069 Acute upper respiratory infection, unspecified: Secondary | ICD-10-CM | POA: Diagnosis not present

## 2024-08-07 DIAGNOSIS — Z419 Encounter for procedure for purposes other than remedying health state, unspecified: Secondary | ICD-10-CM | POA: Diagnosis not present

## 2024-09-01 ENCOUNTER — Other Ambulatory Visit: Payer: Self-pay | Admitting: Family Medicine

## 2024-09-10 ENCOUNTER — Telehealth: Payer: Self-pay

## 2024-09-10 ENCOUNTER — Other Ambulatory Visit (HOSPITAL_COMMUNITY): Payer: Self-pay

## 2024-09-10 NOTE — Telephone Encounter (Signed)
 Pharmacy Patient Advocate Encounter   Received notification from Onbase CMM KEY that prior authorization for Tresiba  FlexTouch (insulin  degludec injection) 100 Units/mL solution is required/requested.   Insurance verification completed.   The patient is insured through [facility].   Per test claim: PA required and submitted KEY/EOC/Request #: [id number] due to PRIOR AUTHORIZATION NOT REQUIRED.   There is a previous PA on file that expires tomorrow (09/11/2024). They may be cancelling the renewal because they want us  to wait until the old one expires. Current 90 day copay is $4. If patient is unable to get refill after this date, please route message back to PA team and we will resubmit the renewal. Thank you.

## 2024-09-14 ENCOUNTER — Other Ambulatory Visit: Payer: Self-pay | Admitting: Family Medicine

## 2024-09-17 ENCOUNTER — Other Ambulatory Visit (HOSPITAL_COMMUNITY): Payer: Self-pay

## 2024-09-17 ENCOUNTER — Telehealth: Payer: Self-pay

## 2024-09-17 NOTE — Telephone Encounter (Signed)
 Pharmacy Patient Advocate Encounter   Received notification from Onbase CMM KEY that prior authorization for Mounjaro  7.5MG /0.5ML auto-injectors is required/requested.   Insurance verification completed.   The patient is insured through [facility].   Per test claim: PA required; PA submitted to above mentioned insurance via Latent Key/confirmation #/EOC [id number] Status is pending

## 2024-09-21 ENCOUNTER — Other Ambulatory Visit (HOSPITAL_COMMUNITY): Payer: Self-pay

## 2024-09-21 NOTE — Telephone Encounter (Signed)
 Pharmacy Patient Advocate Encounter  Received notification from [facility] MEDICAID that Prior Authorization for Mounjaro  7.5MG /0.5ML auto-injectors  has been APPROVED from 09/18/24 to 09/18/25. Ran test claim, Copay is $4.00. This test claim was processed through [facility]- copay amounts may vary at other pharmacies due to pharmacy/plan contracts, or as the patient moves through the different stages of their insurance plan.   PA #/Case ID/Reference #: [id number]

## 2024-09-25 LAB — OPHTHALMOLOGY REPORT-SCANNED

## 2024-11-03 ENCOUNTER — Ambulatory Visit: Admitting: Internal Medicine

## 2024-12-24 ENCOUNTER — Other Ambulatory Visit

## 2024-12-28 ENCOUNTER — Encounter: Admitting: Family Medicine
